# Patient Record
Sex: Female | Born: 1960 | Race: White | Hispanic: No | Marital: Single | Smoking: Never smoker
Health system: Southern US, Community
[De-identification: ages and names within clinical notes are randomized; demographics above are authoritative.]

## PROBLEM LIST (undated history)

## (undated) DIAGNOSIS — B977 Papillomavirus as the cause of diseases classified elsewhere: Secondary | ICD-10-CM

## (undated) DIAGNOSIS — I4891 Unspecified atrial fibrillation: Secondary | ICD-10-CM

## (undated) DIAGNOSIS — Z87442 Personal history of urinary calculi: Secondary | ICD-10-CM

## (undated) DIAGNOSIS — R252 Cramp and spasm: Secondary | ICD-10-CM

## (undated) DIAGNOSIS — M1990 Unspecified osteoarthritis, unspecified site: Secondary | ICD-10-CM

## (undated) DIAGNOSIS — R05 Cough: Secondary | ICD-10-CM

## (undated) DIAGNOSIS — F419 Anxiety disorder, unspecified: Secondary | ICD-10-CM

## (undated) DIAGNOSIS — R002 Palpitations: Secondary | ICD-10-CM

## (undated) DIAGNOSIS — E782 Mixed hyperlipidemia: Secondary | ICD-10-CM

## (undated) DIAGNOSIS — C801 Malignant (primary) neoplasm, unspecified: Secondary | ICD-10-CM

## (undated) DIAGNOSIS — R0600 Dyspnea, unspecified: Secondary | ICD-10-CM

## (undated) DIAGNOSIS — F439 Reaction to severe stress, unspecified: Secondary | ICD-10-CM

## (undated) DIAGNOSIS — C50919 Malignant neoplasm of unspecified site of unspecified female breast: Secondary | ICD-10-CM

## (undated) DIAGNOSIS — M7989 Other specified soft tissue disorders: Secondary | ICD-10-CM

## (undated) DIAGNOSIS — R5383 Other fatigue: Secondary | ICD-10-CM

## (undated) DIAGNOSIS — E785 Hyperlipidemia, unspecified: Secondary | ICD-10-CM

## (undated) DIAGNOSIS — R531 Weakness: Secondary | ICD-10-CM

## (undated) DIAGNOSIS — R450 Nervousness: Secondary | ICD-10-CM

## (undated) DIAGNOSIS — R0602 Shortness of breath: Secondary | ICD-10-CM

## (undated) DIAGNOSIS — Z923 Personal history of irradiation: Secondary | ICD-10-CM

## (undated) DIAGNOSIS — M79606 Pain in leg, unspecified: Secondary | ICD-10-CM

## (undated) DIAGNOSIS — L853 Xerosis cutis: Secondary | ICD-10-CM

## (undated) DIAGNOSIS — R059 Cough, unspecified: Secondary | ICD-10-CM

## (undated) DIAGNOSIS — I499 Cardiac arrhythmia, unspecified: Secondary | ICD-10-CM

## (undated) DIAGNOSIS — R011 Cardiac murmur, unspecified: Secondary | ICD-10-CM

## (undated) DIAGNOSIS — N12 Tubulo-interstitial nephritis, not specified as acute or chronic: Secondary | ICD-10-CM

## (undated) DIAGNOSIS — E559 Vitamin D deficiency, unspecified: Secondary | ICD-10-CM

## (undated) DIAGNOSIS — N390 Urinary tract infection, site not specified: Secondary | ICD-10-CM

## (undated) DIAGNOSIS — M48061 Spinal stenosis, lumbar region without neurogenic claudication: Secondary | ICD-10-CM

## (undated) DIAGNOSIS — I10 Essential (primary) hypertension: Secondary | ICD-10-CM

## (undated) DIAGNOSIS — E669 Obesity, unspecified: Secondary | ICD-10-CM

## (undated) DIAGNOSIS — N200 Calculus of kidney: Secondary | ICD-10-CM

## (undated) DIAGNOSIS — N179 Acute kidney failure, unspecified: Secondary | ICD-10-CM

## (undated) DIAGNOSIS — Z8614 Personal history of Methicillin resistant Staphylococcus aureus infection: Secondary | ICD-10-CM

## (undated) HISTORY — DX: Cramp and spasm: R25.2

## (undated) HISTORY — DX: Vitamin D deficiency, unspecified: E55.9

## (undated) HISTORY — DX: Unspecified atrial fibrillation: I48.91

## (undated) HISTORY — DX: Papillomavirus as the cause of diseases classified elsewhere: B97.7

## (undated) HISTORY — DX: Weakness: R53.1

## (undated) HISTORY — DX: Unspecified osteoarthritis, unspecified site: M19.90

## (undated) HISTORY — PX: DILATION AND CURETTAGE OF UTERUS: SHX78

## (undated) HISTORY — PX: CHOLECYSTECTOMY: SHX55

## (undated) HISTORY — DX: Xerosis cutis: L85.3

## (undated) HISTORY — DX: Palpitations: R00.2

## (undated) HISTORY — DX: Nervousness: R45.0

## (undated) HISTORY — DX: Cough, unspecified: R05.9

## (undated) HISTORY — DX: Shortness of breath: R06.02

## (undated) HISTORY — DX: Other fatigue: R53.83

## (undated) HISTORY — DX: Pain in leg, unspecified: M79.606

## (undated) HISTORY — DX: Cough: R05

## (undated) HISTORY — DX: Reaction to severe stress, unspecified: F43.9

## (undated) HISTORY — DX: Other specified soft tissue disorders: M79.89

## (undated) HISTORY — PX: INDUCED ABORTION: SHX677

## (undated) HISTORY — DX: Mixed hyperlipidemia: E78.2

## (undated) HISTORY — DX: Acute kidney failure, unspecified: N17.9

## (undated) HISTORY — PX: KNEE SURGERY: SHX244

## (undated) HISTORY — DX: Personal history of Methicillin resistant Staphylococcus aureus infection: Z86.14

---

## 2001-03-18 DIAGNOSIS — M1990 Unspecified osteoarthritis, unspecified site: Secondary | ICD-10-CM

## 2001-03-18 HISTORY — DX: Unspecified osteoarthritis, unspecified site: M19.90

## 2006-01-01 ENCOUNTER — Ambulatory Visit: Payer: Self-pay | Admitting: Internal Medicine

## 2006-01-17 ENCOUNTER — Ambulatory Visit: Payer: Self-pay | Admitting: Internal Medicine

## 2006-03-28 DIAGNOSIS — E782 Mixed hyperlipidemia: Secondary | ICD-10-CM | POA: Insufficient documentation

## 2006-04-08 ENCOUNTER — Emergency Department: Payer: Self-pay

## 2006-10-20 ENCOUNTER — Encounter: Admission: RE | Admit: 2006-10-20 | Discharge: 2006-10-20 | Payer: Self-pay | Admitting: Internal Medicine

## 2009-07-19 ENCOUNTER — Ambulatory Visit: Payer: Self-pay | Admitting: Internal Medicine

## 2010-08-08 ENCOUNTER — Ambulatory Visit: Payer: Self-pay | Admitting: Unknown Physician Specialty

## 2010-08-10 ENCOUNTER — Ambulatory Visit: Payer: Self-pay | Admitting: Unknown Physician Specialty

## 2010-10-12 ENCOUNTER — Telehealth (INDEPENDENT_AMBULATORY_CARE_PROVIDER_SITE_OTHER): Payer: Self-pay | Admitting: Internal Medicine

## 2010-10-15 NOTE — Telephone Encounter (Signed)
Results have been given to patient 

## 2011-03-19 DIAGNOSIS — Z8614 Personal history of Methicillin resistant Staphylococcus aureus infection: Secondary | ICD-10-CM

## 2011-03-19 HISTORY — DX: Personal history of Methicillin resistant Staphylococcus aureus infection: Z86.14

## 2011-05-04 ENCOUNTER — Emergency Department: Payer: Self-pay | Admitting: Emergency Medicine

## 2011-05-04 LAB — URINALYSIS, COMPLETE
Bacteria: NONE SEEN
Leukocyte Esterase: NEGATIVE
Nitrite: NEGATIVE
RBC,UR: 1 /HPF (ref 0–5)
Specific Gravity: 1.014 (ref 1.003–1.030)
WBC UR: 1 /HPF (ref 0–5)

## 2011-05-05 LAB — COMPREHENSIVE METABOLIC PANEL
Albumin: 4.1 g/dL (ref 3.4–5.0)
Alkaline Phosphatase: 63 U/L (ref 50–136)
Anion Gap: 16 (ref 7–16)
BUN: 14 mg/dL (ref 7–18)
Bilirubin,Total: 1.1 mg/dL — ABNORMAL HIGH (ref 0.2–1.0)
Chloride: 102 mmol/L (ref 98–107)
Creatinine: 1.13 mg/dL (ref 0.60–1.30)
EGFR (African American): >60
Glucose: 134 mg/dL — ABNORMAL HIGH (ref 65–99)
SGOT(AST): 25 U/L (ref 15–37)
SGPT (ALT): 25 U/L
Total Protein: 7.8 g/dL (ref 6.4–8.2)

## 2011-05-05 LAB — CBC
HCT: 40 % (ref 35.0–47.0)
HGB: 13.5 g/dL (ref 12.0–16.0)
MCHC: 33.8 g/dL (ref 32.0–36.0)
MCV: 90 fL (ref 80–100)
RBC: 4.45 10*6/uL (ref 3.80–5.20)
RDW: 13.5 % (ref 11.5–14.5)

## 2011-05-05 LAB — PREGNANCY, URINE: Pregnancy Test, Urine: NEGATIVE m[IU]/mL

## 2011-05-06 ENCOUNTER — Emergency Department: Payer: Self-pay | Admitting: Emergency Medicine

## 2011-05-06 LAB — CBC
HCT: 32.3 % — ABNORMAL LOW (ref 35.0–47.0)
HGB: 11.1 g/dL — ABNORMAL LOW (ref 12.0–16.0)
MCV: 90 fL (ref 80–100)
RDW: 13.8 % (ref 11.5–14.5)

## 2011-05-06 LAB — COMPREHENSIVE METABOLIC PANEL
Albumin: 2.8 g/dL — ABNORMAL LOW (ref 3.4–5.0)
Bilirubin,Total: 3.1 mg/dL — ABNORMAL HIGH (ref 0.2–1.0)
Chloride: 98 mmol/L (ref 98–107)
Co2: 22 mmol/L (ref 21–32)
Creatinine: 3.09 mg/dL — ABNORMAL HIGH (ref 0.60–1.30)
EGFR (African American): 21 — ABNORMAL LOW
EGFR (Non-African Amer.): 17 — ABNORMAL LOW
Osmolality: 275 (ref 275–301)
Sodium: 133 mmol/L — ABNORMAL LOW (ref 136–145)

## 2011-05-07 ENCOUNTER — Encounter (HOSPITAL_COMMUNITY): Payer: Self-pay | Admitting: *Deleted

## 2011-05-07 ENCOUNTER — Inpatient Hospital Stay (HOSPITAL_COMMUNITY)
Admission: EM | Admit: 2011-05-07 | Discharge: 2011-05-10 | DRG: 568 | Disposition: A | Discharge status: Home / Self Care | Payer: BC Managed Care – PPO | Source: Other Acute Inpatient Hospital | Attending: Internal Medicine | Admitting: Internal Medicine

## 2011-05-07 ENCOUNTER — Inpatient Hospital Stay (HOSPITAL_COMMUNITY): Payer: BC Managed Care – PPO

## 2011-05-07 DIAGNOSIS — N179 Acute kidney failure, unspecified: Secondary | ICD-10-CM

## 2011-05-07 DIAGNOSIS — N200 Calculus of kidney: Secondary | ICD-10-CM

## 2011-05-07 DIAGNOSIS — R51 Headache: Secondary | ICD-10-CM | POA: Diagnosis present

## 2011-05-07 DIAGNOSIS — N390 Urinary tract infection, site not specified: Secondary | ICD-10-CM | POA: Diagnosis present

## 2011-05-07 DIAGNOSIS — A4151 Sepsis due to Escherichia coli [E. coli]: Secondary | ICD-10-CM | POA: Diagnosis present

## 2011-05-07 DIAGNOSIS — A419 Sepsis, unspecified organism: Secondary | ICD-10-CM | POA: Diagnosis present

## 2011-05-07 HISTORY — DX: Cardiac murmur, unspecified: R01.1

## 2011-05-07 LAB — COMPREHENSIVE METABOLIC PANEL
ALT: 20 U/L (ref 0–35)
AST: 30 U/L (ref 0–37)
Albumin: 2.4 g/dL — ABNORMAL LOW (ref 3.5–5.2)
Alkaline Phosphatase: 65 U/L (ref 39–117)
BUN: 40 mg/dL — ABNORMAL HIGH (ref 6–23)
Chloride: 103 mEq/L (ref 96–112)
Potassium: 4 mEq/L (ref 3.5–5.1)
Sodium: 133 mEq/L — ABNORMAL LOW (ref 135–145)
Total Bilirubin: 2.1 mg/dL — ABNORMAL HIGH (ref 0.3–1.2)
Total Protein: 5.6 g/dL — ABNORMAL LOW (ref 6.0–8.3)

## 2011-05-07 LAB — CBC
MCHC: 34.1 g/dL (ref 30.0–36.0)
Platelets: 99 10*3/uL — ABNORMAL LOW (ref 150–400)
RDW: 14.1 % (ref 11.5–15.5)
WBC: 18.9 10*3/uL — ABNORMAL HIGH (ref 4.0–10.5)

## 2011-05-07 LAB — URINALYSIS, ROUTINE W REFLEX MICROSCOPIC
Glucose, UA: NEGATIVE mg/dL
Ketones, ur: NEGATIVE mg/dL
pH: 5.5 (ref 5.0–8.0)

## 2011-05-07 LAB — URINE MICROSCOPIC-ADD ON

## 2011-05-07 LAB — MRSA PCR SCREENING: MRSA by PCR: NEGATIVE

## 2011-05-07 LAB — GLUCOSE, CAPILLARY: Glucose-Capillary: 77 mg/dL (ref 70–99)

## 2011-05-07 MED ORDER — PANTOPRAZOLE SODIUM 40 MG PO TBEC
40.0000 mg | DELAYED_RELEASE_TABLET | Freq: Every day | ORAL | Status: DC
  Administered 2011-05-07 – 2011-05-10 (×4): 40 mg via ORAL
  Filled 2011-05-07 (×4): qty 1

## 2011-05-07 MED ORDER — SODIUM CHLORIDE 0.9 % IV SOLN
INTRAVENOUS | Status: DC
  Administered 2011-05-07 (×2): via INTRAVENOUS
  Administered 2011-05-08: 100 mL/h via INTRAVENOUS

## 2011-05-07 MED ORDER — DEXTROSE 5 % IV SOLN
1.0000 g | INTRAVENOUS | Status: DC
  Administered 2011-05-07 – 2011-05-10 (×4): 1 g via INTRAVENOUS
  Filled 2011-05-07 (×4): qty 10

## 2011-05-07 MED ORDER — BIOTENE DRY MOUTH MT LIQD
15.0000 mL | Freq: Two times a day (BID) | OROMUCOSAL | Status: DC
  Administered 2011-05-08 – 2011-05-10 (×5): 15 mL via OROMUCOSAL

## 2011-05-07 MED ORDER — SODIUM CHLORIDE 0.9 % IV SOLN: 250.0000 mL | INTRAVENOUS | Status: DC | PRN

## 2011-05-07 MED ORDER — ONDANSETRON HCL 4 MG/2ML IJ SOLN
4.0000 mg | Freq: Four times a day (QID) | INTRAMUSCULAR | Status: DC | PRN
  Administered 2011-05-07: 4 mg via INTRAVENOUS
  Filled 2011-05-07: qty 2

## 2011-05-07 MED ORDER — ACETAMINOPHEN 325 MG PO TABS
650.0000 mg | ORAL_TABLET | Freq: Four times a day (QID) | ORAL | Status: DC | PRN
  Administered 2011-05-07 – 2011-05-08 (×4): 650 mg via ORAL
  Filled 2011-05-07 (×4): qty 2

## 2011-05-07 MED ORDER — CHLORHEXIDINE GLUCONATE 0.12 % MT SOLN
15.0000 mL | Freq: Two times a day (BID) | OROMUCOSAL | Status: DC
  Administered 2011-05-07 – 2011-05-10 (×7): 15 mL via OROMUCOSAL
  Filled 2011-05-07 (×9): qty 15

## 2011-05-07 NOTE — Progress Notes (Signed)
Pt was transferred to room 4506.  Report called to [place], Charity fundraiser. Will continue to monitor.

## 2011-05-07 NOTE — Progress Notes (Signed)
UR Completed.  Heather Conrad [phone] 05/07/2011  

## 2011-05-07 NOTE — H&P (Signed)
Name: Heather Conrad MRN: [id number] DOB: 01-14-1961  LOS: 0        PCCM ADMISSION NOTE  History of Present Illness: 51 yo F with minimal PMH significant only for kidney stone 12 years ago and cholecystectomy in 1990s transferred from [facility] for hypotension and nephrolithiasis.  Patient was seen 3 days ago for right flank pain, diagnosed with kidney stone, and sent home with Rx for oxycodone.  She then started having nausea and vomiting (NBNB) 2 days ago.  She was reevaluated yesterday and sent to UC for IVF due to dehydration.  From there, she was admitted to [facility].  She was given a total of 7L fluid resuscitation.  She urinated a small amount earlier today, but has not urinated since then.  Normally she has no issues with urination.   Urology was consulted for nephrolithiasis, but did not feel comfortable with lithotripsy or stent placement due to patient's hypotension.  IR was then consulted, who did not feel comfortable doing any procedures due to irregular anatomy (one kidney smaller than the other).  Patient was transferred here for further evaluation and IR for anatomically difficult nephrostomy.   Lines: 2/19 Foley  Cultures: 2/18 Blood 2/18 Urine  Antibiotics: 2/18 Ceftriaxone  Tests/Events: 2/17  Renal U/S: mild R hydronephrosis with diminished R ureteral jet into bladder; partially obstructing R ureteral stone is not excluded (R kidney 11.92 x 6.07 x 5.40 cm; L kidney 11.34 x 4.70 x 4.82 cm)  2/18  CT abd/pelvis: Stone measuring 10mm present at R ureterovesical junction with at least mild R hydroureteronephrosis and some perinephric and periureteral inflammation.  Some apparent punctate faint densities centrally in L kidney could include one or more tiny stones vs artifact. No L hydroureteronephrosis.  No acute imflammatory or obstructive process elsewhere in abdomen and pelvis.   Consults:   Past Medical History: No past medical history on file. H/o kidney stone 12 years  ago Heart murmur NO h/o HTN, CHF, DM, renal failure   Past Surgical History No past surgical history on file. Cholecystectomy 1990s  Medications Prior to Admission: No current facility-administered medications on file as of 05/07/2011.   No current outpatient prescriptions on file as of 05/07/2011.  Oxycodone PRN pain (took 3 doses)  Allergies:  Allergies not on file NKDA  Family History No family history on file. Social History:  does not have a smoking history on file. She does not have any smokeless tobacco history on file. Her alcohol and drug histories not on file.  Social History: History   Social History  . Marital Status: Unknown    Spouse Name: N/A    Number of Children: N/A  . Years of Education: N/A   Occupational History  . Not on file.   Social History Main Topics  . Smoking status: Not on file  . Smokeless tobacco: Not on file  . Alcohol Use: Not on file  . Drug Use: Not on file  . Sexually Active: Not on file   Other Topics Concern  . Not on file   Social History Narrative  . No narrative on file     Review of Systems: Negative unless otherwise noted in HPI   Vital Signs: Temperature 97.8 F (36.6 C), temperature source Oral.  Physical Examination:  Gen: NAD, appropriate, appears comfortable, obese Neuro: alert and oriented x3, nonfocal HEENT: MMM, PERRLA, EOMI, no icterus  CV: RRR, no murmur Pulm: clear to auscultation bilaterally, no wheezes, rales, crackles Abd: soft, nontender, nondistended, +BS  Ext: warm, no edema, 2+ pedal pulses  Labs/Imaging: Results for orders placed during the hospital encounter of 05/07/11 (from the past 48 hour(s))  GLUCOSE, CAPILLARY     Status: Normal   Collection Time   05/07/11  2:48 AM      Component Value Range Comment   Glucose-Capillary 77  70 - 99 (mg/dL)    Comment 1 Documented in Chart      Comment 2 Notify RN      No results found.   Assessment and Plan: 51 yo F with minimal PMH  transferred from [facility] with hypotension, resolved s/p fluid resuscitation and nephrolithiasis.  1. Renal A: Acute renal failure (Cr 3.09 on 2/18) Nephrolithiasis: Stone position unchanged per report on 2nd CT done 2/18 Outside labs from 2/16: UA>> trace ketones, mucus; U preg >> neg; WBC 14.8 --> 16.9 (2/18); Cr 1.13 --> 3.09 (2/18); BUN 14 --> 38 (2/18); electrolytes WNL Renal U/S 2/17: mild R hydronephrosis with diminished R ureteral jet into bladder; partially obstructing R ureteral stone is not excluded (R kidney 11.92 x 6.07 x 5.40 cm; L kidney 11.34 x 4.70 x 4.82 cm)  CT abd 2/18:  10mm stone present at R ureterovesical junction with at least mild R hydroureteronephrosis  P: -Given CTX x1 at [facility] 2/18 -S/p 7L IVF -Monitor Is/Os -Cont IVF since still oliguric  -Recheck CMET, CBC -Consult Urology and/or IR in AM --> will leave NPO until evaluated for possible intervention   2. Cardiovascular A: Hypotension secondary to severe dehydration, now resolved s/p 7L fluids, hemodynamically stable P: -Monitor BPs -Appears euvolemic at this time -Continue IVF  -Blood cultures drawn as OSH  3. GI A: Nausea and vomiting, likely secondary to combination of infection and pain medication P: -Zofran PRN -Check CMET  4.  ID A:  Suspected pyelonepritis -Send UA, urine / blood cultures -Ceftriaxone  Code status: Full  Best Practice: ICU status under PCCM DVT: SCDs SUP:  Protonix Nutrition: NPO  Conrad,Heather 05/07/2011, 2:58 AM   Patient examined, records reviewed, assessment and plan discussed with resident team as above.  Orlean Bradford, M.D. Pulmonary and Critical Care Medicine [facility] Cell: [phone] Pager: [phone]

## 2011-05-07 NOTE — Significant Event (Signed)
Since admission earlier today, pt has normal blood pressure.  She is making urine, and renal function trending down.  She denies abdominal pain.  She was noted to have passed stone this morning.  Urology consult has been placed.  She does not need ICU level care.  Continue rocephin pending culture results.  Will transfer to medical floor.  Have asked [facility] to assume care from 2/20 and PCCM will sign off.  Coralyn Helling, MD 05/07/2011, 11:50 AM Pager:  [phone]

## 2011-05-07 NOTE — Consult Note (Addendum)
Urology Consult  Referring physician: Dr. Craige Cotta Reason for referral: Urosepsis with stone  Chief Complaint: Right Flank pain  History of Present Illness: 51 yo female from [place], Heather Conrad, noted onset of right-sided flank pain on Saturday night, associated with both nausea and vomiting. No hematuria. Pain reminiscent of kidney stone colic from 12 yrs ago ( which she spontaneously passed). She was seen at [facility] ER, with R flank pain, nausea and vomiting. She had IV fluid, and ultrasound, and then discharged home. Pain returned Sunday, and was episodic in nature, still in the R flank. She "delt with it" on Sunday, but noted onset of chills and sweats Sunday night. On Monday, she called Dr. Kahn\'s office Monday, and was referred to Urgent Care, with Rx IV fluid, and eventual return to [place] ED. WBC 14,000, increasing to 16,000. CT performed, which is said to show 10mm R u-v junction stone with mild hydronephrosis, and perinephric and periureteral inflammation. She  Was considered for perc nephrostomy, but was thought to have an abnormal kidney anatomy, and, in combination with urosepsis, was transferred to [place] for further evaluation and treatment. Unknown if [place] urologist saw patient. She has now passed stone this AM, and feels much improved. No nausea or vomiting, but she still complains of flank pain and general abdominal pain, unrelieved despite passing stone.   Past Medical History  Diagnosis Date  . Heart murmur     per pt report   Past Surgical History  Procedure Date  . Cholecystectomy     19 92    Medications: I have reviewed the patient's current medications. Allergies: Not on File  History reviewed. No pertinent family history. Social History:  reports that she has never smoked. She does not have any smokeless tobacco history on file. She reports that she does not drink alcohol or use illicit drugs. Passed stone 12 yrs ago.   ROS: All systems are reviewed  and negative except as noted. General abdominal pain, R flank pain and RUQ. Relieved nausea and vomiting.   Physical Exam:  Vital signs in last 24 hours: Temp:  [97.5 F (36.4 C)-98 F (36.7 C)] 97.5 F (36.4 C) (02/19 1138) Pulse Rate:  [62-69] 66  (02/19 0900) Resp:  [2-24] 22  (02/19 0900) BP: (112-124)/(69-82) 121/79 mmHg (02/19 0900) SpO2:  [95 %-100 %] 96 % (02/19 0900) Weight:  [114.2 kg (251 lb 12.3 oz)] 114.2 kg (251 lb 12.3 oz) (02/19 0300)  General: wdwnwf in NAD Cardiovascular: Skin warm; not flushed Respiratory: Breaths quiet; no shortness of breath Abdomen: No masses. + BS. + tenderness, mild, RUQ and R flank.  Neurological: Normal sensation to touch Musculoskeletal: Normal motor function arms and legs Lymphatics: No inguinal adenopathy Skin: No rashes Genitourinary:Normal BUS. Normal suprapubic area.   Laboratory Data:  Results for orders placed during the hospital encounter of 05/07/11 (from the past 72 hour(s))  GLUCOSE, CAPILLARY     Status: Normal   Collection Time   05/07/11  2:48 AM      Component Value Range Comment   Glucose-Capillary 77  70 - 99 (mg/dL)    Comment 1 Documented in Chart      Comment 2 Notify RN     MRSA PCR SCREENING     Status: Normal   Collection Time   05/07/11  2:50 AM      Component Value Range Comment   MRSA by PCR NEGATIVE  NEGATIVE    CBC     Status: Abnormal  Collection Time   05/07/11  4:20 AM      Component Value Range Comment   WBC 18.9 (*) 4.0 - 10.5 (K/uL)    RBC 3.41 (*) 3.87 - 5.11 (MIL/uL)    Hemoglobin 10.2 (*) 12.0 - 15.0 (g/dL)    HCT 47.8 (*) 29.5 - 46.0 (%)    MCV 87.7  78.0 - 100.0 (fL)    MCH 29.9  26.0 - 34.0 (pg)    MCHC 34.1  30.0 - 36.0 (g/dL)    RDW 62.1  30.8 - 65.7 (%)    Platelets 99 (*) 150 - 400 (K/uL) PLATELET COUNT CONFIRMED BY SMEAR  COMPREHENSIVE METABOLIC PANEL     Status: Abnormal   Collection Time   05/07/11  4:20 AM      Component Value Range Comment   Sodium 133 (*) 135 - 145  (mEq/L)    Potassium 4.0  3.5 - 5.1 (mEq/L)    Chloride 103  96 - 112 (mEq/L)    CO2 17 (*) 19 - 32 (mEq/L)    Glucose, Bld 81  70 - 99 (mg/dL)    BUN 40 (*) 6 - 23 (mg/dL)    Creatinine, Ser 8.46 (*) 0.50 - 1.10 (mg/dL)    Calcium 7.1 (*) 8.4 - 10.5 (mg/dL)    Total Protein 5.6 (*) 6.0 - 8.3 (g/dL)    Albumin 2.4 (*) 3.5 - 5.2 (g/dL)    AST 30  0 - 37 (U/L)    ALT 20  0 - 35 (U/L)    Alkaline Phosphatase 65  39 - 117 (U/L)    Total Bilirubin 2.1 (*) 0.3 - 1.2 (mg/dL)    GFR calc non Af Amer 19 (*) >90 (mL/min)    GFR calc Af Amer 22 (*) >90 (mL/min)   URINALYSIS, ROUTINE W REFLEX MICROSCOPIC     Status: Abnormal   Collection Time   05/07/11  5:57 AM      Component Value Range Comment   Color, Urine AMBER (*) YELLOW  BIOCHEMICALS MAY BE AFFECTED BY COLOR   APPearance CLOUDY (*) CLEAR     Specific Gravity, Urine 1.011  1.005 - 1.030     pH 5.5  5.0 - 8.0     Glucose, UA NEGATIVE  NEGATIVE (mg/dL)    Hgb urine dipstick MODERATE (*) NEGATIVE     Bilirubin Urine SMALL (*) NEGATIVE     Ketones, ur NEGATIVE  NEGATIVE (mg/dL)    Protein, ur 30 (*) NEGATIVE (mg/dL)    Urobilinogen, UA 1.0  0.0 - 1.0 (mg/dL)    Nitrite NEGATIVE  NEGATIVE     Leukocytes, UA TRACE (*) NEGATIVE    URINE MICROSCOPIC-ADD ON     Status: Abnormal   Collection Time   05/07/11  5:57 AM      Component Value Range Comment   Squamous Epithelial / LPF MANY (*) RARE     WBC, UA 3-6  <3 (WBC/hpf)    RBC / HPF 3-6  <3 (RBC/hpf)    Bacteria, UA FEW (*) RARE     Casts HYALINE CASTS (*) NEGATIVE  GRANULAR CAST   Urine-Other LESS THAN 10 mL OF URINE SUBMITTED      Recent Results (from the past 240 hour(s))  MRSA PCR SCREENING     Status: Normal   Collection Time   05/07/11  2:50 AM      Component Value Range Status Comment   MRSA by PCR NEGATIVE  NEGATIVE  Final  Creatinine:  Basename 05/07/11 0420  CREATININE 2.74*    Xrays: See report/[place] / in X-ray. Unavailable for review.    Impression/Assessment:  Urosepsis, with resolving ARF. Cr 1.13 baseline, rising to 3.09, and today 2.74 with passed ureteral stone.  She still has R flank and RUQ pain. Will re-ck her CT stone protocol today because of her continued pain, but believe she may have post passage pain. . She remains  on IV antibiotics.   Plan:  Take passed stone for analysis. Continue IV antibiotics-may need replacement of IV. OK with urology to feed.   Calvin Jablonowski I 05/07/2011, 1:27 PM      NOTE: If CT shows small kidney, then will need Lasix renal scan for evaluation.

## 2011-05-08 LAB — BASIC METABOLIC PANEL
BUN: 29 mg/dL — ABNORMAL HIGH (ref 6–23)
Chloride: 113 mEq/L — ABNORMAL HIGH (ref 96–112)
GFR calc non Af Amer: 50 mL/min — ABNORMAL LOW (ref >90)
Glucose, Bld: 110 mg/dL — ABNORMAL HIGH (ref 70–99)
Potassium: 3.5 mEq/L (ref 3.5–5.1)
Sodium: 139 mEq/L (ref 135–145)

## 2011-05-08 LAB — CBC
HCT: 29.2 % — ABNORMAL LOW (ref 36.0–46.0)
Hemoglobin: 10.1 g/dL — ABNORMAL LOW (ref 12.0–15.0)
MCH: 29.8 pg (ref 26.0–34.0)
RBC: 3.39 MIL/uL — ABNORMAL LOW (ref 3.87–5.11)

## 2011-05-08 LAB — URINE CULTURE: Culture  Setup Time: 201302190614

## 2011-05-08 MED ORDER — POTASSIUM CHLORIDE CRYS ER 20 MEQ PO TBCR
40.0000 meq | EXTENDED_RELEASE_TABLET | Freq: Once | ORAL | Status: AC
  Administered 2011-05-08: 40 meq via ORAL
  Filled 2011-05-08: qty 2

## 2011-05-08 NOTE — Progress Notes (Signed)
DAILY PROGRESS NOTE                              GENERAL INTERNAL MEDICINE [facility]  SUBJECTIVE: Denies any pain  OBJECTIVE: BP 109/77  Pulse 60  Temp(Src) 96.9 F (36.1 C) (Oral)  Resp 20  Ht 5\' 4"  (1.626 m)  Wt 114.4 kg (252 lb 3.3 oz)  BMI 43.29 kg/m2  SpO2 99%  Intake/Output Summary (Last 24 hours) at 05/08/11 1650 Last data filed at 05/08/11 1600  Gross per 24 hour  Intake   2755 ml  Output   1250 ml  Net   1505 ml                      Weight change: 0.2 kg (7.1 oz) Physical Exam: General: Alert and awake oriented x3 not in any acute distress. HEENT: anicteric sclera, pupils equal reactive to light and accommodation CVS: S1-S2 heard, no murmur rubs or gallops Chest: clear to auscultation bilaterally, no wheezing rales or rhonchi Abdomen:  normal bowel sounds, soft, nontender, nondistended, no organomegaly Neuro: Cranial nerves II-XII intact, no focal neurological deficits Extremities: no cyanosis, no clubbing or edema noted bilaterally   Lab Results:  Basename 05/08/11 0610 05/07/11 0420  NA 139 133*  K 3.5 4.0  CL 113* 103  CO2 19 17*  GLUCOSE 110* 81  BUN 29* 40*  CREATININE 1.24* 2.74*  CALCIUM 7.8* 7.1*  MG -- --  PHOS -- --    Basename 05/07/11 0420  AST 30  ALT 20  ALKPHOS 65  BILITOT 2.1*  PROT 5.6*  ALBUMIN 2.4*   No results found for this basename: LIPASE:2,AMYLASE:2 in the last 72 hours  Basename 05/08/11 0610 05/07/11 0420  WBC 10.0 18.9*  NEUTROABS -- --  HGB 10.1* 10.2*  HCT 29.2* 29.9*  MCV 86.1 87.7  PLT 113* 99*   No results found for this basename: CKTOTAL:3,CKMB:3,CKMBINDEX:3,TROPONINI:3 in the last 72 hours No components found with this basename: POCBNP:3 No results found for this basename: DDIMER:2 in the last 72 hours No results found for this basename: HGBA1C:2 in the last 72 hours No results found for this basename: CHOL:2,HDL:2,LDLCALC:2,TRIG:2,CHOLHDL:2,LDLDIRECT:2 in the last 72 hours No results found for  this basename: TSH,T4TOTAL,FREET3,T3FREE,THYROIDAB in the last 72 hours No results found for this basename: VITAMINB12:2,FOLATE:2,FERRITIN:2,TIBC:2,IRON:2,RETICCTPCT:2 in the last 72 hours  Micro Results: Recent Results (from the past 240 hour(s))  MRSA PCR SCREENING     Status: Normal   Collection Time   05/07/11  2:50 AM      Component Value Range Status Comment   MRSA by PCR NEGATIVE  NEGATIVE  Final   CULTURE, BLOOD (ROUTINE X 2)     Status: Normal (Preliminary result)   Collection Time   05/07/11  4:20 AM      Component Value Range Status Comment   Specimen Description BLOOD LEFT ARM   Final    Special Requests BOTTLES DRAWN AEROBIC AND ANAEROBIC 10CC EA   Final    Culture  Setup Time [id number]   Final    Culture     Final    Value:        BLOOD CULTURE RECEIVED NO GROWTH TO DATE CULTURE WILL BE HELD FOR 5 DAYS BEFORE ISSUING A FINAL NEGATIVE REPORT   Report Status PENDING   Incomplete   CULTURE, BLOOD (ROUTINE X 2)     Status: Normal (Preliminary result)  Collection Time   05/07/11  4:30 AM      Component Value Range Status Comment   Specimen Description BLOOD LEFT HAND   Final    Special Requests BOTTLES DRAWN AEROBIC AND ANAEROBIC 10CC EA   Final    Culture  Setup Time [id number]   Final    Culture     Final    Value:        BLOOD CULTURE RECEIVED NO GROWTH TO DATE CULTURE WILL BE HELD FOR 5 DAYS BEFORE ISSUING A FINAL NEGATIVE REPORT   Report Status PENDING   Incomplete   URINE CULTURE     Status: Normal   Collection Time   05/07/11  5:57 AM      Component Value Range Status Comment   Specimen Description URINE, RANDOM   Final    Special Requests NONE   Final    Culture  Setup Time [id number]   Final    Colony Count 20,OOO COLONIES/ML   Final    Culture     Final    Value: Multiple bacterial morphotypes present, none predominant. Suggest appropriate recollection if clinically indicated.   Report Status 05/08/2011 FINAL   Final     Studies/Results: Ct Abdomen  Pelvis Wo Contrast  05/07/2011  *RADIOLOGY REPORT*  Clinical Data: Right flank pain.  Resolving acute renal failure and urosepsis.  CT ABDOMEN AND PELVIS WITHOUT CONTRAST  Technique:  Multidetector CT imaging of the abdomen and pelvis was performed following the standard protocol without intravenous contrast.  Comparison: None.  Findings: Small right pleural effusion and minimal left pleural effusion.  Small amount of bilateral lower lobe atelectasis. Cholecystectomy clips.  Mild dilatation of the right renal collecting system and ureter to the level of the ureteral vesicle junction.  No obstructing stone or mass visible.  Tiny calculus in the upper pole of the right kidney.  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  Tiny calculus in the mid left kidney.  Poorly distended and grossly normal appearing urinary bladder.  Intrauterine device in the central uterus.  Unremarkable noncontrasted appearance of the liver, spleen, pancreas, adrenal glands and ovaries.  No gastrointestinal abnormalities or enlarged lymph nodes seen.  Mild ruptures peritoneal soft tissue stranding centrally and on the right.  No findings suspicious for appendicitis.  Enlarged lymph nodes. Lumbar and lower thoracic spine degenerative changes.  IMPRESSION:  1.  Mild right hydronephrosis and hydroureter to the level of the ureteral vesicle junction.  No obstructing stone visualized.  This could be due to a recently passed stone or non-visualized stricture or mass. 2.  Single tiny calculus in each kidney. 3.  Small right pleural effusion and minimal left pleural effusion. 4.  Mild bilateral lower lobe atelectasis.  Original Report Authenticated By: Darrol Angel, M.D.   Medications: Scheduled Meds:   . antiseptic oral rinse  15 mL Mouth Rinse q12n4p  . cefTRIAXone (ROCEPHIN)  IV  1 g Intravenous Q24H  . chlorhexidine  15 mL Mouth Rinse BID  . pantoprazole  40 mg Oral Q1200   Continuous Infusions:   . sodium chloride 100 mL/hr at  05/07/11 1344   PRN Meds:.sodium chloride, acetaminophen, ondansetron (ZOFRAN) IV  ASSESSMENT & PLAN: Principal Problem:  *Nephrolithiasis Active Problems:  Acute renal failure   ARF Likely secondary to nephrolithiasis, improving with IVF. Cr now is 1.2, check BMP in AM.  Nephrolithiasis Urology is following, CT scan showed right hydroureter but no nephrolithiasis, likely patient passed the stone. Urology for recommendations.  UTI On Rocephin, Urine and blood culture are pending.   LOS: 1 day   Xandrea Clarey A 05/08/2011, 4:50 PM

## 2011-05-09 LAB — BASIC METABOLIC PANEL WITH GFR
BUN: 14 mg/dL (ref 6–23)
CO2: 22 meq/L (ref 19–32)
Calcium: 8.4 mg/dL (ref 8.4–10.5)
Chloride: 109 meq/L (ref 96–112)
Creatinine, Ser: 0.88 mg/dL (ref 0.50–1.10)
GFR calc Af Amer: 88 mL/min — ABNORMAL LOW
GFR calc non Af Amer: 76 mL/min — ABNORMAL LOW
Glucose, Bld: 122 mg/dL — ABNORMAL HIGH (ref 70–99)
Potassium: 3.6 meq/L (ref 3.5–5.1)
Sodium: 137 meq/L (ref 135–145)

## 2011-05-09 LAB — CBC
Hemoglobin: 10 g/dL — ABNORMAL LOW (ref 12.0–15.0)
MCH: 29.2 pg (ref 26.0–34.0)
MCHC: 33.7 g/dL (ref 30.0–36.0)
MCV: 86.8 fL (ref 78.0–100.0)
RBC: 3.42 MIL/uL — ABNORMAL LOW (ref 3.87–5.11)

## 2011-05-09 MED ORDER — OXYCODONE-ACETAMINOPHEN 5-325 MG PO TABS
1.0000 | ORAL_TABLET | ORAL | Status: DC | PRN
  Administered 2011-05-09 – 2011-05-10 (×5): 1 via ORAL
  Filled 2011-05-09 (×5): qty 1

## 2011-05-09 MED ORDER — POTASSIUM CHLORIDE CRYS ER 20 MEQ PO TBCR
EXTENDED_RELEASE_TABLET | Freq: Once | ORAL | Status: AC
  Administered 2011-05-09: 20 meq via ORAL
  Filled 2011-05-09: qty 1

## 2011-05-09 NOTE — Progress Notes (Signed)
DAILY PROGRESS NOTE                              [facility]  SUBJECTIVE: Complaining about throbbing headache, 7/10.  OBJECTIVE: BP 131/78  Pulse 65  Temp(Src) 97.3 F (36.3 C) (Oral)  Resp 20  Ht 5\' 4"  (1.626 m)  Wt 114.6 kg (252 lb 10.4 oz)  BMI 43.37 kg/m2  SpO2 99%  Intake/Output Summary (Last 24 hours) at 05/09/11 1123 Last data filed at 05/09/11 0600  Gross per 24 hour  Intake   2885 ml  Output    600 ml  Net   2285 ml                      Weight change: 0.2 kg (7.1 oz) Physical Exam: General: Alert and awake oriented x3 not in any acute distress. HEENT: anicteric sclera, pupils equal reactive to light and accommodation CVS: S1-S2 heard, no murmur rubs or gallops Chest: clear to auscultation bilaterally, no wheezing rales or rhonchi Abdomen:  normal bowel sounds, soft, nontender, nondistended, no organomegaly Neuro: Cranial nerves II-XII intact, no focal neurological deficits Extremities: no cyanosis, no clubbing or edema noted bilaterally   Lab Results:  [facility] 05/09/11 0529 05/08/11 0610  NA 137 139  K 3.6 3.5  CL 109 113*  CO2 22 19  GLUCOSE 122* 110*  BUN 14 29*  CREATININE 0.88 1.24*  CALCIUM 8.4 7.8*  MG -- --  PHOS -- --    Basename 05/07/11 0420  AST 30  ALT 20  ALKPHOS 65  BILITOT 2.1*  PROT 5.6*  ALBUMIN 2.4*   Basename 05/09/11 0529 05/08/11 0610  WBC 8.1 10.0  NEUTROABS -- --  HGB 10.0* 10.1*  HCT 29.7* 29.2*  MCV 86.8 86.1  PLT 110* 113*   Micro Results: Recent Results (from the past 240 hour(s))  MRSA PCR SCREENING     Status: Normal   Collection Time   05/07/11  2:50 AM      Component Value Range Status Comment   MRSA by PCR NEGATIVE  NEGATIVE  Final   CULTURE, BLOOD (ROUTINE X 2)     Status: Normal (Preliminary result)   Collection Time   05/07/11  4:20 AM      Component Value Range Status Comment   Specimen Description BLOOD LEFT ARM   Final    Special Requests BOTTLES DRAWN AEROBIC  AND ANAEROBIC 10CC EA   Final    Culture  Setup Time [id number]   Final    Culture     Final    Value:        BLOOD CULTURE RECEIVED NO GROWTH TO DATE CULTURE WILL BE HELD FOR 5 DAYS BEFORE ISSUING A FINAL NEGATIVE REPORT   Report Status PENDING   Incomplete   CULTURE, BLOOD (ROUTINE X 2)     Status: Normal (Preliminary result)   Collection Time   05/07/11  4:30 AM      Component Value Range Status Comment   Specimen Description BLOOD LEFT HAND   Final    Special Requests BOTTLES DRAWN AEROBIC AND ANAEROBIC 10CC EA   Final    Culture  Setup Time [id number]   Final    Culture     Final    Value:        BLOOD CULTURE RECEIVED NO GROWTH TO DATE CULTURE WILL BE HELD FOR 5 DAYS BEFORE ISSUING  A FINAL NEGATIVE REPORT   Report Status PENDING   Incomplete   URINE CULTURE     Status: Normal   Collection Time   05/07/11  5:57 AM      Component Value Range Status Comment   Specimen Description URINE, RANDOM   Final    Special Requests NONE   Final    Culture  Setup Time [id number]   Final    Colony Count 20,OOO COLONIES/ML   Final    Culture     Final    Value: Multiple bacterial morphotypes present, none predominant. Suggest appropriate recollection if clinically indicated.   Report Status 05/08/2011 FINAL   Final     Studies/Results: Ct Abdomen Pelvis Wo Contrast  05/07/2011  *RADIOLOGY REPORT*  Clinical Data: Right flank pain.  Resolving acute renal failure and urosepsis.  CT ABDOMEN AND PELVIS WITHOUT CONTRAST  Technique:  Multidetector CT imaging of the abdomen and pelvis was performed following the standard protocol without intravenous contrast.  Comparison: None.  Findings: Small right pleural effusion and minimal left pleural effusion.  Small amount of bilateral lower lobe atelectasis. Cholecystectomy clips.  Mild dilatation of the right renal collecting system and ureter to the level of the ureteral vesicle junction.  No obstructing stone or mass visible.  Tiny calculus in the upper  pole of the right kidney.  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  Tiny calculus in the mid left kidney.  Poorly distended and grossly normal appearing urinary bladder.  Intrauterine device in the central uterus.  Unremarkable noncontrasted appearance of the liver, spleen, pancreas, adrenal glands and ovaries.  No gastrointestinal abnormalities or enlarged lymph nodes seen.  Mild ruptures peritoneal soft tissue stranding centrally and on the right.  No findings suspicious for appendicitis.  Enlarged lymph nodes. Lumbar and lower thoracic spine degenerative changes.  IMPRESSION:  1.  Mild right hydronephrosis and hydroureter to the level of the ureteral vesicle junction.  No obstructing stone visualized.  This could be due to a recently passed stone or non-visualized stricture or mass. 2.  Single tiny calculus in each kidney. 3.  Small right pleural effusion and minimal left pleural effusion. 4.  Mild bilateral lower lobe atelectasis.  Original Report Authenticated By: Darrol Angel, M.D.   Medications: Scheduled Meds:    . antiseptic oral rinse  15 mL Mouth Rinse q12n4p  . cefTRIAXone (ROCEPHIN)  IV  1 g Intravenous Q24H  . chlorhexidine  15 mL Mouth Rinse BID  . pantoprazole  40 mg Oral Q1200  . potassium chloride  40 mEq Oral Once  . potassium chloride  40 mEq Oral Once   Continuous Infusions:    . sodium chloride 100 mL/hr (05/08/11 2245)   PRN Meds:.sodium chloride, acetaminophen, ondansetron (ZOFRAN) IV, oxyCODONE-acetaminophen  ASSESSMENT & PLAN: Principal Problem:  *Nephrolithiasis Active Problems:  Acute renal failure   ARF Likely secondary to nephrolithiasis, improved with IV fluids. Creatinine is 0.8. Patient presented with creatinine of 2.4.  Nephrolithiasis Urology is following, CT scan showed right hydroureter but no nephrolithiasis, likely patient passed the stone. Urology for recommendations.  UTI On Rocephin, the urine culture obtained in the hospital here showed  no growth. We will contact [facility] for the urine culture, to direct antibiotic therapy as outpatient.  Headache Likely secondary to the concurrent infection and fever. Will use Percocet as needed.   LOS: 2 days   Heather Conrad A 05/09/2011, 11:23 AM

## 2011-05-10 DIAGNOSIS — N390 Urinary tract infection, site not specified: Secondary | ICD-10-CM | POA: Diagnosis present

## 2011-05-10 DIAGNOSIS — A4151 Sepsis due to Escherichia coli [E. coli]: Secondary | ICD-10-CM | POA: Diagnosis present

## 2011-05-10 LAB — BASIC METABOLIC PANEL
CO2: 21 mEq/L (ref 19–32)
Calcium: 8.6 mg/dL (ref 8.4–10.5)
Creatinine, Ser: 0.76 mg/dL (ref 0.50–1.10)
Glucose, Bld: 92 mg/dL (ref 70–99)

## 2011-05-10 MED ORDER — CIPROFLOXACIN HCL 750 MG PO TABS: 750.0000 mg | ORAL_TABLET | Freq: Two times a day (BID) | ORAL | Status: AC

## 2011-05-10 MED ORDER — OXYCODONE-ACETAMINOPHEN 5-325 MG PO TABS: 1.0000 | ORAL_TABLET | ORAL | Status: AC | PRN

## 2011-05-10 NOTE — Progress Notes (Signed)
Utilization Review Completed.Heather Conrad T2/22/2013   

## 2011-05-10 NOTE — Discharge Summary (Signed)
HOSPITAL DISCHARGE SUMMARY  Heather Conrad  MRN: [id number]  DOB:April 26, 1960  Date of Admission: 05/07/2011 Date of Discharge: 05/10/2011         LOS: 3 days   Attending Physician:Cesia Orf A  Patient's DGU:YQIH,KVQQVZ, MD, MD  Consults: [place] critical care service  Jodie Echevaria with urology service  Discharge Diagnoses: Present on Admission:  .Nephrolithiasis .Acute renal failure .UTI (lower urinary tract infection) .Sepsis due to Escherichia coli (E. coli)   Medication List  As of 05/10/2011  5:46 PM   TAKE these medications         ciprofloxacin 750 MG tablet   Commonly known as: CIPRO   Take 1 tablet (750 mg total) by mouth 2 (two) times daily.      oxyCODONE-acetaminophen 5-325 MG per tablet   Commonly known as: PERCOCET   Take 1 tablet by mouth every 4 (four) hours as needed for pain.             Brief Admission History: 51 yo F with minimal PMH significant only for kidney stone 12 years ago and cholecystectomy in 1990s transferred from [facility] for hypotension and nephrolithiasis. Patient was seen 3 days ago for right flank pain, diagnosed with kidney stone, and sent home with Rx for oxycodone. She then started having nausea and vomiting 2 days ago. She was reevaluated yesterday and sent to UC for IVF due to dehydration. From there, she was admitted to [facility]. She was given a total of 7L fluid resuscitation. She urinated a small amount earlier today, but has not urinated since then. Normally she has no issues with urination. Urology was consulted for nephrolithiasis, but did not feel comfortable with lithotripsy or stent placement due to patient's hypotension. IR was then consulted, who did not feel comfortable doing any procedures due to irregular anatomy (one kidney smaller than the other). Patient was transferred here for further evaluation and IR for anatomically difficult nephrostomy.   Hospital Course: Present on Admission:   .Nephrolithiasis .Acute renal failure .UTI (lower urinary tract infection) .Sepsis due to Escherichia coli (E. coli)  1. Sepsis: This is secondary to Escherichia coli. The cultures from [facility] comes back positive for one out of two blood cultures positive for pansensitive Escherichia coli. Patient was treated initially with IV Rocephin for 4 days. At the time of discharge she was discharged on high-dose oral ciprofloxacin of 750 mg twice a day for 10 more days to complete 14 days of antibiotics. The sepsis likely is secondary to UTI. Patient did not require vasopressors. Blood pressure was stable at the time she was presented to [facility] she stayed overnight in the ICU and she was transferred to regular bed the very next morning.  2. UTI: Complicated UTI secondary to nephrolithiasis. The urine culture is in the hospital here did not grow any organism. This is likely because patient had Rocephin in the emergency department in [facility]. But blood culture obtained from there and it showed Escherichia coli.  3. Nephrolithiasis: Patient had right hydroureter, likely secondary to UTI Will stone. At the time of admission to [facility] patient likely she passed the stone. CT scan of abdomen pelvis with renal protocol did show evidence of renal hydroureter which is improving but no evidence of stone suggesting that the stone is passed. Urology see the patient and recommended for antibiotic and followup as outpatient.  4. Acute renal failure: This is resolved, likely secondary to obstructive uropathy from renal stone. Patient presented with  creatinine of 2.4 creatinine the day of discharge is 0.7.   Day of Discharge BP 161/86  Pulse 47  Temp(Src) 98.8 F (37.1 C) (Oral)  Resp 20  Ht 5\' 4"  (1.626 m)  Wt 115.9 kg (255 lb 8.2 oz)  BMI 43.86 kg/m2  SpO2 97% Physical Exam: GEN: No acute distress, cooperative with exam PSYCH: He is alert and  oriented x4; does not appear anxious does not appear depressed; affect is normal  HEENT: Mucous membranes pink and anicteric;  Mouth: without oral thrush or lesions Eyes: PERRLA; EOM intact;  Neck: no cervical lymphadenopathy nor thyromegaly or carotid bruit; no JVD;  CHEST WALL: No tenderness, symmetrical to breathing bilaterally CHEST: Normal respiration, clear to auscultation bilaterally  HEART: Regular rate and rhythm; no murmurs, rubs or gallops, S1 and S2 heard  BACK: No kyphosis or scoliosis; no CVA tenderness  ABDOMEN:  soft non-tender; no masses, no organomegaly, normal abdominal bowel sounds; no pannus; no intertriginous candida.  EXTREMITIES: No bone or joint deformity; no edema; no ulcerations.  PULSES: 2+ and symmetric, neurovascularity is intact SKIN: Normal hydration no rash or ulceration, no flushing or suspicious lesions  CNS: Cranial nerves 2-12 grossly intact no focal neurologic deficit, coordination is intact gait not tested    Results for orders placed during the hospital encounter of 05/07/11 (from the past 24 hour(s))  BASIC METABOLIC PANEL     Status: Normal   Collection Time   05/10/11  6:45 AM      Component Value Range   Sodium 137  135 - 145 (mEq/L)   Potassium 4.3  3.5 - 5.1 (mEq/L)   Chloride 107  96 - 112 (mEq/L)   CO2 21  19 - 32 (mEq/L)   Glucose, Bld 92  70 - 99 (mg/dL)   BUN 13  6 - 23 (mg/dL)   Creatinine, Ser 4.09  0.50 - 1.10 (mg/dL)   Calcium 8.6  8.4 - 81.1 (mg/dL)   GFR calc non Af Amer >90  >90 (mL/min)   GFR calc Af Amer >90  >90 (mL/min)    Disposition: Home   Follow-up Appts: Discharge Orders    Future Orders Please Complete By Expires   Diet general      Increase activity slowly         Follow-up Information    Follow up with Yves Dill, MD. Schedule an appointment as soon as possible for a visit in 1 week.   Contact information:   [address] [place] [address] [phone]          I spent 40  minutes completing paperwork and coordinating discharge efforts.  SignedClydia Llano A 05/10/2011, 5:46 PM

## 2011-05-10 NOTE — Progress Notes (Signed)
05/10/2011 [facility] Case Management Note [phone]    CARE MANAGEMENT NOTE 05/10/2011  Patient:  Heather Conrad, Heather Conrad   Account Number:  [id number]  Date Initiated:  05/07/2011  Documentation initiated by:  [facility]  Subjective/Objective Assessment:   Admitted with ??pylonephritis - hypotension - tx in from outside hospital. Lives with children.     Action/Plan:   Anticipated DC Date:  05/10/2011   Anticipated DC Plan:  HOME/SELF CARE      DC Planning Services  CM consult      Choice offered to / List presented to:             Status of service:  Completed, signed off Medicare Important Message given?   (If response is "NO", the following Medicare IM given date fields will be blank) Date Medicare IM given:   Date Additional Medicare IM given:    Discharge Disposition:  HOME/SELF CARE  Per UR Regulation:  Reviewed for med. necessity/level of care/duration of stay  Comments:  05/10/11-1503-J.Glenda Spelman,RN,BSN  [phone]      51yo patient admitted as transfer from [facility] with c/o hypotension and nephrolithiasis. Prior to admission, patient lived at home with family support. Independent with ADLs. In to see patient. No discharge or home needs identified. Anticipated discharge to home with self care today.

## 2011-05-12 LAB — CULTURE, BLOOD (SINGLE)

## 2011-05-13 LAB — CULTURE, BLOOD (ROUTINE X 2)
Culture  Setup Time: 201302190849
Culture: NO GROWTH

## 2011-08-25 ENCOUNTER — Emergency Department (HOSPITAL_COMMUNITY)
Admission: EM | Admit: 2011-08-25 | Discharge: 2011-08-25 | Disposition: A | Discharge status: Home / Self Care | Payer: BC Managed Care – PPO | Attending: Emergency Medicine | Admitting: Emergency Medicine

## 2011-08-25 ENCOUNTER — Encounter (HOSPITAL_COMMUNITY): Payer: Self-pay | Admitting: *Deleted

## 2011-08-25 DIAGNOSIS — M79609 Pain in unspecified limb: Secondary | ICD-10-CM

## 2011-08-25 DIAGNOSIS — I8001 Phlebitis and thrombophlebitis of superficial vessels of right lower extremity: Secondary | ICD-10-CM

## 2011-08-25 DIAGNOSIS — I8000 Phlebitis and thrombophlebitis of superficial vessels of unspecified lower extremity: Secondary | ICD-10-CM | POA: Insufficient documentation

## 2011-08-25 HISTORY — DX: Calculus of kidney: N20.0

## 2011-08-25 MED ORDER — PREDNISONE 10 MG PO TABS: 20.0000 mg | ORAL_TABLET | Freq: Every day | ORAL | Status: DC

## 2011-08-25 MED ORDER — OXYCODONE-ACETAMINOPHEN 5-325 MG PO TABS
2.0000 | ORAL_TABLET | Freq: Once | ORAL | Status: AC
  Administered 2011-08-25: 2 via ORAL
  Filled 2011-08-25: qty 2

## 2011-08-25 MED ORDER — NAPROXEN 500 MG PO TABS: 500.0000 mg | ORAL_TABLET | Freq: Two times a day (BID) | ORAL | Status: AC

## 2011-08-25 NOTE — ED Notes (Signed)
Reports injuring right foot and leg at work 4-5 weeks ago, still having pain and has been seen at pcp for it. Had sore area above right knee which has now moved to upper thigh, wants to be checked for blood clot.

## 2011-08-25 NOTE — Discharge Instructions (Signed)
Phlebitis  Phlebitis is a redness, tenderness and soreness (inflammation) in a vein. This can occur in your arms, legs, or torso (trunk), as well as deeper inside your body.   CAUSES   Phlebitis can be triggered by multiple factors. These include:   Reduced (restricted) blood flow through your veins. This happens with prolonged bed rest, long distance travel, injury or surgery. Being overweight (obese) and pregnant can also restrict blood flow and lead to phlebitis.   Putting a catheter in the vein (intravenous or IV) and giving certain medications through in the vein (intravenously).   Cancer and cancer treatment.   Use of illegal intravenous drugs.   Inflammatory diseases.   Inherited (genetic) diseases that increase the risk for blood clots.   Hormone therapy (such as birth control pills).  SYMPTOMS    Red, tender, swollen, painful area on your skin.   Usually, the area will be long and narrow.   Low grade fever.   Significant firmness along the center of this area. This can indicate that a blood clot has formed.   Surrounding redness or a high fever, which can indicate an infection (cellulitis).  DIAGNOSIS    The appearance of your condition and your symptoms will cause your caregiver to suspect phlebitis. Usually, this is enough for a diagnosis.   Your caregiver may request blood tests or an ultrasound test of the area to be sure you do not have an infection or a blood clot. Blood tests and discussing your family history may also indicate if you have an underlying genetic disease that causes blood clots.   Occasionally, a piece of tissue is taken from the body (biopsy) if an unusual cause of phlebitis is suspected.  TREATMENT    Raise (elevate) the affected area above the level of the heart.   Apply a warm compress or heating pad for 20 minutes, 3 or 4 times a day. If you use an electric heating pad, follow the directions so you do not burn yourself.   Anti-inflammatory medications are usually  recommended. Follow your caregiver's directions.   Any IV catheter, if present, will be removed by your caregiver.   Your caregiver may prescribe medicines that kill germs (antibiotics) if an infection is present.   Your caregiver may recommend blood thinners if a blood clot is suspected or present.   Support stockings or bandages may be helpful, depending on the cause and location of the phlebitis.   Surgery may be needed to remove very damaged sections of vein, but this is rare.  HOME CARE INSTRUCTIONS    Take medications exactly as prescribed.   Follow up with your caregiver as directed.   Use support stockings or bandages if advised. These will speed healing and prevent recurrence.   If you are on blood thinners:   Do follow-up blood tests exactly as directed.   Check with your caregiver before using any new medications.   Wear a pendant to show that you are on blood thinners.   For phlebitis in the legs:   Avoid prolonged standing or bed rest.   Keep your legs moving. Raise your legs with sitting or lying.   Do not smoke.   Women, particularly those over the age of 35, should consider the risks and benefits of taking the contraceptive pill. This kind of hormone treatment can increase your risk for blood clots.  SEEK MEDICAL CARE IF:    You have unusual bruising or any bleeding problems.   Swelling   or pain in your affected arm or leg is not gradually improving.   You are on anti-inflammatory medication and you develop belly (abdominal) pain.  SEEK IMMEDIATE MEDICAL CARE IF:    An unexplained oral temperature above 100.5 F (38.1 C) develops.   You have sudden onset of chest pain or difficulty breathing.  Document Released: 02/26/2001 Document Revised: 02/21/2011 Document Reviewed: 11/28/2008  [employer] Patient Information 2012 [employer].

## 2011-08-25 NOTE — ED Provider Notes (Signed)
History     CSN: [id number]  Arrival date & time 08/25/11  1356   First MD Initiated Contact with Patient 08/25/11 1525      Chief Complaint  Patient presents with  . Leg Pain    (Consider location/radiation/quality/duration/timing/severity/associated sxs/prior treatment) HPI  51 year old female with history of heart palpitation presents with request for evaluation of leg pain. Patient states 5 weeks ago while and was pushing a heavy cart and injured her R knee. States the cart and was stuck against a thick carpet so she uses her right knee to push it forward. Patient notice pain to her right knee after the incident. She filed worker's comp, and have been coming to urgent care for evaluation of her right knee. She has also been referred to orthopedic for further management. Patient has had an MRI performed 5 days ago but does not know the result yet. For the past 2 weeks she has been having increasing pain to her knee which radiates up to the thigh. Described pain as a sore and achy sensation with swelling to the thigh. Pain worsening with palpation and with walking. She also notice a red streak extending up the thigh. Patient is worried about blood clot. She denies any prior history of blood clots. She is not on any exogenous hormone, recent surgery, recent travel, or prolonged bed rest. She denies fever, chills, chest pain, shortness of breath.  Past Medical History  Diagnosis Date  . Heart murmur     per pt report  . Kidney stone     Past Surgical History  Procedure Date  . Cholecystectomy     1992    History reviewed. No pertinent family history.  History  Substance Use Topics  . Smoking status: Never Smoker   . Smokeless tobacco: Not on file  . Alcohol Use: No    OB History    Grav Para Term Preterm Abortions TAB SAB Ect Mult Living                  Review of Systems  Constitutional: Negative for fever.  Respiratory: Negative for shortness of breath.     Cardiovascular: Positive for leg swelling. Negative for chest pain.  Musculoskeletal: Positive for gait problem.  Neurological: Negative for headaches.    Allergies  Review of patient's allergies indicates no known allergies.  Home Medications   Current Outpatient Rx  Name Route Sig Dispense Refill  . HYDROCODONE-ACETAMINOPHEN 7.5-325 MG PO TABS Oral Take 1 tablet by mouth every 4 (four) hours as needed. For pain    . OXYCODONE-ACETAMINOPHEN 5-325 MG PO TABS Oral Take 1 tablet by mouth every 4 (four) hours as needed. For pain      BP 162/87  Pulse 86  Temp(Src) 98.1 F (36.7 C) (Oral)  Resp 18  SpO2 96%  Physical Exam  Nursing note and vitals reviewed. Constitutional: She appears well-developed and well-nourished. No distress.       Awake, alert, nontoxic appearance. Morbidly obese  HENT:  Head: Atraumatic.  Eyes: Conjunctivae are normal. Right eye exhibits no discharge. Left eye exhibits no discharge.  Neck: Neck supple.  Cardiovascular: Normal rate and regular rhythm.   Pulmonary/Chest: Effort normal. No respiratory distress. She exhibits no tenderness.  Abdominal: Soft. There is no tenderness. There is no rebound.  Musculoskeletal: She exhibits tenderness.       Right knee: She exhibits decreased range of motion and ecchymosis. She exhibits no swelling, no effusion, no deformity and no laceration. tenderness  found.       Right upper leg: She exhibits tenderness. She exhibits no bony tenderness and no swelling.       Legs:      ROM appears intact, no obvious focal weakness  Neurological:       Mental status and motor strength appears intact  Skin: No rash noted.  Psychiatric: She has a normal mood and affect.    ED Course  Procedures (including critical care time)  Labs Reviewed - No data to display No results found.   No diagnosis found.  Heather Conrad, Heather Conrad  Female  01-16-1961  [id number]            Progress Notes signed by Sydnee Cabal at  08/25/11 1613     Author:  Sydnee Cabal  Service:  (none)  Author Type:  Sonographer   Filed:  08/25/11 1613  Note Time:  08/25/11 1612          VASCULAR LAB  PRELIMINARY PRELIMINARY PRELIMINARY PRELIMINARY  Right lower extremity venous Doppler completed.  Preliminary report: There is no DVT noted in the right lower extremity. There is thrombosis noted in thigh in a varicosity. The left greater saphenous vein also appears to have new, non occlusive thrombus forming.  Heather Conrad,  08/25/2011, 4:12 PM     MDM  Tenderness with palpable cord and erythema to R thigh.  Will obtain US to r/o DVT. Likely superficial thrombus.  No significant risk factors for DVT. Pt has had prior knee work up including MRI.  Will defer to Delbert Harness for further care.   4:59 PM Venous Doppler performed on right lower extremity shows evidence of superficial thrombus and no evidence of DVT. The findings is consistence with exam. Care instruction including nonsteroidal anti-inflammatory medication, prednisone, and warm compress were discussed. Patient to followup with her orthopedic Dr. for further management.     Fayrene Helper, PA-C 08/25/11 1701

## 2011-08-25 NOTE — Progress Notes (Signed)
VASCULAR LAB PRELIMINARY  PRELIMINARY  PRELIMINARY  PRELIMINARY  Right lower extremity venous Doppler completed.    Preliminary report: There is no DVT noted in the right lower extremity.  There is thrombosis noted in thigh in a varicosity.  The left greater saphenous vein also appears to have new, non occlusive thrombus forming.  Sherren Kerns Grant, 08/25/2011, 4:12 PM

## 2011-08-27 NOTE — ED Provider Notes (Signed)
Medical screening examination/treatment/procedure(s) were performed by non-physician practitioner and as supervising physician I was immediately available for consultation/collaboration.   Carleene Cooper III, MD 08/27/11 1245

## 2012-01-17 DIAGNOSIS — N179 Acute kidney failure, unspecified: Secondary | ICD-10-CM

## 2012-01-17 HISTORY — DX: Acute kidney failure, unspecified: N17.9

## 2014-02-16 ENCOUNTER — Other Ambulatory Visit: Payer: Self-pay | Admitting: Orthopedic Surgery

## 2014-02-16 DIAGNOSIS — M542 Cervicalgia: Secondary | ICD-10-CM

## 2014-02-16 DIAGNOSIS — M5412 Radiculopathy, cervical region: Secondary | ICD-10-CM

## 2014-03-02 ENCOUNTER — Other Ambulatory Visit: Payer: Self-pay

## 2014-03-04 ENCOUNTER — Other Ambulatory Visit: Payer: Self-pay | Admitting: Orthopedic Surgery

## 2014-03-04 ENCOUNTER — Ambulatory Visit
Admission: RE | Admit: 2014-03-04 | Discharge: 2014-03-04 | Disposition: A | Discharge status: Home / Self Care | Payer: Managed Care, Other (non HMO) | Source: Ambulatory Visit | Attending: Orthopedic Surgery | Admitting: Orthopedic Surgery

## 2014-03-04 DIAGNOSIS — M542 Cervicalgia: Secondary | ICD-10-CM

## 2014-03-04 DIAGNOSIS — M5412 Radiculopathy, cervical region: Secondary | ICD-10-CM

## 2015-02-24 ENCOUNTER — Ambulatory Visit (INDEPENDENT_AMBULATORY_CARE_PROVIDER_SITE_OTHER): Payer: Managed Care, Other (non HMO) | Admitting: Sports Medicine

## 2015-02-24 ENCOUNTER — Ambulatory Visit (INDEPENDENT_AMBULATORY_CARE_PROVIDER_SITE_OTHER): Payer: Managed Care, Other (non HMO)

## 2015-02-24 ENCOUNTER — Encounter: Payer: Self-pay | Admitting: Sports Medicine

## 2015-02-24 DIAGNOSIS — B351 Tinea unguium: Secondary | ICD-10-CM

## 2015-02-24 DIAGNOSIS — M2141 Flat foot [pes planus] (acquired), right foot: Secondary | ICD-10-CM | POA: Diagnosis not present

## 2015-02-24 DIAGNOSIS — M79672 Pain in left foot: Secondary | ICD-10-CM

## 2015-02-24 DIAGNOSIS — M7750 Other enthesopathy of unspecified foot: Secondary | ICD-10-CM

## 2015-02-24 DIAGNOSIS — M2142 Flat foot [pes planus] (acquired), left foot: Secondary | ICD-10-CM

## 2015-02-24 DIAGNOSIS — M21619 Bunion of unspecified foot: Secondary | ICD-10-CM | POA: Diagnosis not present

## 2015-02-24 DIAGNOSIS — M6588 Other synovitis and tenosynovitis, other site: Secondary | ICD-10-CM | POA: Diagnosis not present

## 2015-02-24 MED ORDER — MELOXICAM 15 MG PO TABS: 15.0000 mg | ORAL_TABLET | Freq: Every day | ORAL | Status: DC

## 2015-02-24 MED ORDER — METHYLPREDNISOLONE 4 MG PO TBPK: ORAL_TABLET | ORAL | Status: DC

## 2015-02-24 NOTE — Progress Notes (Signed)
Patient ID: Heather Conrad, female   DOB: 01/09/1961, 54 y.o.   MRN: [id number] Subjective: Heather Conrad is a 54 y.o. female patient who presents to office for evaluation of Left foot pain. Patient complains of progressive pain especially over the last 2-3 months in the Left foot at the top of the foot and ankle. Reports that pain is worse after work and especially after removing her shoes. Pain is made worse with attempting to dorsiflex the left foot. Pain has been present for over a total of 7-6 months has taken ibuprofen and Tylenol with little improvement. Denies injury/trip/fall/sprain/any causative factors, however, state that 6-7 months ago vaguely recalls filling a point of discomfort at the top of the left foot and in audible pop that could be related to pain which happened while walking at work.   Patient denies history of recent antibiotic use.   She is also concerned with fungal nail changes states that her nails have grown thickened, discolored with weird debris underneath that has been present for several years and desires treatment. Patient has tried prescription and over-the-counter antifungal lacquer without improvement in nails. Patient denies any other pedal complaints.  Patient Active Problem List   Diagnosis Date Noted  . UTI (lower urinary tract infection) 05/10/2011  . Sepsis due to Escherichia coli (E. coli) ([place]) 05/10/2011  . Nephrolithiasis 05/07/2011  . Acute renal failure ([place]) 05/07/2011   Current Outpatient Prescriptions on File Prior to Visit  Medication Sig Dispense Refill  . HYDROcodone-acetaminophen (NORCO) 7.5-325 MG per tablet Take 1 tablet by mouth every 4 (four) hours as needed. For pain    . oxyCODONE-acetaminophen (PERCOCET) 5-325 MG per tablet Take 1 tablet by mouth every 4 (four) hours as needed. For pain     No current facility-administered medications on file prior to visit.   No Known Allergies  Objective:  General: Alert and oriented x3 in no  acute distress  Dermatology: No open lesions bilateral lower extremities, no webspace macerations, no ecchymosis bilateral, all nails x 10 are discolored, thickened, dystrophic with mild subungual debris, but well manicured.  Vascular: Dorsalis Pedis and Posterior Tibial pedal pulses 2/4, Capillary Fill Time 3 seconds,(+) pedal hair growth bilateral, no edema bilateral lower extremities, Temperature gradient within normal limits.  Neurology: Gross sensation intact via light touch bilateral, No babinski sign present bilateral. (- )Tinels sign.   Musculoskeletal:There is no tenderness with palpation to the left foot and ankle however, there is slight soft tissue fullness noted along the tibialis anterior tendon, tendon feels intact with no palpable gap or nodularity ,No pain with calf compression bilateral. Ankle and pedal joint range of motion is within normal limits with no pain, there is no 1st ray hypermobility noted bilateral, significant bunion and pes planus noted, left greater than right with lesser hammertoe deformities. Strength within normal limits in all groups bilateral.   Xrays     Left Foot 3 views    Impression: Mild decrease in osseous mineralization, increase in first metatarsal angle significant for bunion, midfoot joint space narrowing with dorsal spurring, midtarsal breach, inferior and posterior calcaneal spurs with the most significant spur located inferiorly, mild lesser hammertoe, pes planus foot type,  no fractures or frank dislocation, soft tissues within normal limits, no foreign body.  Assessment and Plan: Problem List Items Addressed This Visit    None    Visit Diagnoses    Left foot pain    -  Primary    Relevant Medications  methylPREDNISolone (MEDROL DOSEPAK) 4 MG TBPK tablet    meloxicam (MOBIC) 15 MG tablet    Other Relevant Orders    DG Foot Complete Left    Tendonitis of ankle or foot        Left TA     Relevant Medications    methylPREDNISolone  (MEDROL DOSEPAK) 4 MG TBPK tablet    meloxicam (MOBIC) 15 MG tablet    Pes planus of both feet        Bunion        Dermatophytosis of nail        Relevant Orders    CBC with Differential    Basic Metabolic Panel    Hepatic Function Panel       -Complete examination performed -Xrays reviewed -Discussed treatement options for possible tendonitis likely made worse by mechanical foot structure -Rx Medrol dose pack and Meloxicam to start after dose pack is complete -Recommended icing 1-2 times daily -Rx Tri-Lock ankle brace to allow for functional and mechanical support -Recommend good supportive shoes daily -Ordered labs; CBC, BMP liver function test and consideration of starting Lamisil once treatment for tendinitis is completed  -Patient to return to office in 3 weeks or sooner if condition worsens. If symptoms are still present, May consider ordering a MRI to evaluate for possible partial rupture of the tibialis anterior tendon on the left foot.   Heather Conrad, DPM

## 2015-02-24 NOTE — Progress Notes (Deleted)
   Subjective:    Patient ID: Heather Conrad, female    DOB: 08-24-1960, 54 y.o.   MRN: [id number]  HPI    Review of Systems  All other systems reviewed and are negative.      Objective:   Physical Exam        Assessment & Plan:

## 2015-02-25 LAB — CBC WITH DIFFERENTIAL/PLATELET
BASOS ABS: 0 10*3/uL (ref 0.0–0.2)
Basos: 0 %
EOS (ABSOLUTE): 0.2 10*3/uL (ref 0.0–0.4)
Eos: 3 %
HEMATOCRIT: 36.8 % (ref 34.0–46.6)
HEMOGLOBIN: 12.3 g/dL (ref 11.1–15.9)
IMMATURE GRANULOCYTES: 0 %
Immature Grans (Abs): 0 10*3/uL (ref 0.0–0.1)
Lymphocytes Absolute: 2.5 10*3/uL (ref 0.7–3.1)
Lymphs: 26 %
MCH: 26.9 pg (ref 26.6–33.0)
MCHC: 33.4 g/dL (ref 31.5–35.7)
MCV: 81 fL (ref 79–97)
Monocytes Absolute: 0.5 10*3/uL (ref 0.1–0.9)
Monocytes: 5 %
NEUTROS PCT: 66 %
Neutrophils Absolute: 6.4 10*3/uL (ref 1.4–7.0)
Platelets: 318 10*3/uL (ref 150–379)
RBC: 4.57 x10E6/uL (ref 3.77–5.28)
RDW: 15.7 % — ABNORMAL HIGH (ref 12.3–15.4)
WBC: 9.7 10*3/uL (ref 3.4–10.8)

## 2015-02-25 LAB — HEPATIC FUNCTION PANEL
ALBUMIN: 4.2 g/dL (ref 3.5–5.5)
ALK PHOS: 83 IU/L (ref 39–117)
ALT: 18 IU/L (ref 0–32)
AST: 17 IU/L (ref 0–40)
BILIRUBIN TOTAL: 0.3 mg/dL (ref 0.0–1.2)
Bilirubin, Direct: 0.11 mg/dL (ref 0.00–0.40)
TOTAL PROTEIN: 6.6 g/dL (ref 6.0–8.5)

## 2015-02-25 LAB — BASIC METABOLIC PANEL
BUN/Creatinine Ratio: 21 (ref 9–23)
BUN: 19 mg/dL (ref 6–24)
CALCIUM: 9.5 mg/dL (ref 8.7–10.2)
CO2: 24 mmol/L (ref 18–29)
CREATININE: 0.89 mg/dL (ref 0.57–1.00)
Chloride: 106 mmol/L (ref 97–106)
GFR calc Af Amer: 86 mL/min/{1.73_m2} (ref >59)
GFR, EST NON AFRICAN AMERICAN: 74 mL/min/{1.73_m2} (ref >59)
GLUCOSE: 82 mg/dL (ref 65–99)
POTASSIUM: 4.8 mmol/L (ref 3.5–5.2)
SODIUM: 144 mmol/L (ref 136–144)

## 2015-03-17 ENCOUNTER — Ambulatory Visit: Payer: Managed Care, Other (non HMO) | Admitting: Sports Medicine

## 2015-03-24 ENCOUNTER — Ambulatory Visit (INDEPENDENT_AMBULATORY_CARE_PROVIDER_SITE_OTHER): Payer: 59 | Admitting: Sports Medicine

## 2015-03-24 ENCOUNTER — Encounter: Payer: Self-pay | Admitting: Sports Medicine

## 2015-03-24 DIAGNOSIS — M79672 Pain in left foot: Secondary | ICD-10-CM

## 2015-03-24 DIAGNOSIS — M6588 Other synovitis and tenosynovitis, other site: Secondary | ICD-10-CM | POA: Diagnosis not present

## 2015-03-24 DIAGNOSIS — B351 Tinea unguium: Secondary | ICD-10-CM | POA: Diagnosis not present

## 2015-03-24 DIAGNOSIS — M21619 Bunion of unspecified foot: Secondary | ICD-10-CM

## 2015-03-24 DIAGNOSIS — M2142 Flat foot [pes planus] (acquired), left foot: Secondary | ICD-10-CM | POA: Diagnosis not present

## 2015-03-24 DIAGNOSIS — M2141 Flat foot [pes planus] (acquired), right foot: Secondary | ICD-10-CM

## 2015-03-24 DIAGNOSIS — M7750 Other enthesopathy of unspecified foot: Secondary | ICD-10-CM

## 2015-03-24 MED ORDER — TERBINAFINE HCL 250 MG PO TABS: 250.0000 mg | ORAL_TABLET | Freq: Every day | ORAL | Status: DC

## 2015-03-24 NOTE — Progress Notes (Signed)
Patient ID: Heather Conrad, female   DOB: 01-04-1961, 55 y.o.   MRN: [id number]  Subjective: Heather Conrad is a 55 y.o. female patient who returns to office for follow up eval of Left foot pain; reports that the injection and medications (dose pack and anti-inflammatories) helped her foot feel better; has NO pain. States that she felt good wearing the brace however admits that she did not wear it every day because sometimes it would feel too tight. Patient denies any other pedal complaints.  Patient Active Problem List   Diagnosis Date Noted  . UTI (lower urinary tract infection) 05/10/2011  . Sepsis due to Escherichia coli (E. coli) ([place]) 05/10/2011  . Nephrolithiasis 05/07/2011  . Acute renal failure ([place]) 05/07/2011   Current Outpatient Prescriptions on File Prior to Visit  Medication Sig Dispense Refill  . HYDROcodone-acetaminophen (NORCO) 7.5-325 MG per tablet Take 1 tablet by mouth every 4 (four) hours as needed. For pain    . meloxicam (MOBIC) 15 MG tablet Take 1 tablet (15 mg total) by mouth daily. 30 tablet 0  . methylPREDNISolone (MEDROL DOSEPAK) 4 MG TBPK tablet Take as Instructed; Dose pack 21 tablet 0  . oxyCODONE-acetaminophen (PERCOCET) 5-325 MG per tablet Take 1 tablet by mouth every 4 (four) hours as needed. For pain     No current facility-administered medications on file prior to visit.   No Known Allergies  Objective:  General: Alert and oriented x3 in no acute distress  Dermatology: No open lesions bilateral lower extremities, no webspace macerations, no ecchymosis bilateral, all nails x 10 are short, discolored, thickened, dystrophic with mild subungual debris consistent with onychomycosis.  Vascular: Dorsalis Pedis and Posterior Tibial pedal pulses 2/4, Capillary Fill Time 3 seconds,(+) pedal hair growth bilateral, no edema bilateral lower extremities, Temperature gradient within normal limits.  Neurology: Gross sensation intact via light touch bilateral, No  babinski sign present bilateral. (- )Tinels sign.   Musculoskeletal:There is no tenderness with palpation to the left foot and ankle however, decreased soft tissue fullness at the tibialis anterior tendon, tendon feels intact with no palpable gap or nodularity ,No pain with calf compression bilateral. Ankle and pedal joint range of motion is within normal limits with no pain, there is no 1st ray hypermobility noted bilateral, significant bunion and pes planus noted, left greater than right with lesser hammertoe deformities. Strength within normal limits in all groups bilateral.    Assessment and Plan: Problem List Items Addressed This Visit    None    Visit Diagnoses    Left foot pain    -  Primary    Tendonitis of ankle or foot        Pes planus of both feet        Bunion        Dermatophytosis of nail        Relevant Medications    terbinafine (LAMISIL) 250 MG tablet       -Complete examination performed -Discussed treatement options for fungal nails and structural related tendonitis that is now resolved -Patient to complete Meloxicam once this is done to start Lamisil as ordered for fungal nails -Recommended icing 1-2 times daily as needed  -Cont with Tri-Lock ankle brace to allow for functional and mechanical support and slowly wean to prevent re-exacerbation  -Recommend good supportive shoes daily -Patient to return to office in 6 weeks for follow up eval on lamisil or sooner if condition worsens. May scan for custom orthotics at next visit to  provide additional functional support for patient for long term management/prevention of recurrent tendonitis.  Heather Conrad, DPM

## 2015-05-05 ENCOUNTER — Ambulatory Visit: Payer: 59 | Admitting: Sports Medicine

## 2015-07-28 DIAGNOSIS — R002 Palpitations: Secondary | ICD-10-CM | POA: Insufficient documentation

## 2015-07-28 DIAGNOSIS — I4891 Unspecified atrial fibrillation: Secondary | ICD-10-CM | POA: Insufficient documentation

## 2015-08-17 ENCOUNTER — Other Ambulatory Visit: Payer: Self-pay | Admitting: Certified Nurse Midwife

## 2015-08-17 DIAGNOSIS — Z1231 Encounter for screening mammogram for malignant neoplasm of breast: Secondary | ICD-10-CM

## 2015-08-18 ENCOUNTER — Ambulatory Visit
Admission: RE | Admit: 2015-08-18 | Discharge: 2015-08-18 | Disposition: A | Discharge status: Home / Self Care | Payer: 59 | Source: Ambulatory Visit | Attending: Certified Nurse Midwife | Admitting: Certified Nurse Midwife

## 2015-08-18 DIAGNOSIS — Z1231 Encounter for screening mammogram for malignant neoplasm of breast: Secondary | ICD-10-CM | POA: Insufficient documentation

## 2015-08-28 ENCOUNTER — Other Ambulatory Visit: Payer: Self-pay | Admitting: Certified Nurse Midwife

## 2015-08-28 DIAGNOSIS — R928 Other abnormal and inconclusive findings on diagnostic imaging of breast: Secondary | ICD-10-CM

## 2015-09-06 ENCOUNTER — Ambulatory Visit
Admission: RE | Admit: 2015-09-06 | Discharge: 2015-09-06 | Disposition: A | Discharge status: Home / Self Care | Payer: 59 | Source: Ambulatory Visit | Attending: Certified Nurse Midwife | Admitting: Certified Nurse Midwife

## 2015-09-06 DIAGNOSIS — R928 Other abnormal and inconclusive findings on diagnostic imaging of breast: Secondary | ICD-10-CM

## 2015-09-06 DIAGNOSIS — N63 Unspecified lump in breast: Secondary | ICD-10-CM | POA: Insufficient documentation

## 2016-03-18 DIAGNOSIS — E785 Hyperlipidemia, unspecified: Secondary | ICD-10-CM

## 2016-03-18 HISTORY — PX: FOOT FRACTURE SURGERY: SHX645

## 2016-03-18 HISTORY — DX: Hyperlipidemia, unspecified: E78.5

## 2016-04-13 ENCOUNTER — Encounter: Payer: Self-pay | Admitting: Emergency Medicine

## 2016-04-13 ENCOUNTER — Ambulatory Visit
Admission: EM | Admit: 2016-04-13 | Discharge: 2016-04-13 | Disposition: A | Discharge status: Home / Self Care | Payer: 59 | Attending: Family Medicine | Admitting: Family Medicine

## 2016-04-13 ENCOUNTER — Ambulatory Visit (INDEPENDENT_AMBULATORY_CARE_PROVIDER_SITE_OTHER): Payer: 59

## 2016-04-13 DIAGNOSIS — M79672 Pain in left foot: Secondary | ICD-10-CM

## 2016-04-13 MED ORDER — TRAMADOL HCL 50 MG PO TABS: 50.0000 mg | ORAL_TABLET | Freq: Three times a day (TID) | ORAL | 0 refills | Status: DC | PRN

## 2016-04-13 MED ORDER — PREDNISONE 20 MG PO TABS: 40.0000 mg | ORAL_TABLET | Freq: Every day | ORAL | 0 refills | Status: DC

## 2016-04-13 NOTE — Discharge Instructions (Signed)
Take medication as prescribed. Rest. Drink plenty of fluids. Elevate foot, wrap and apply ice.   Follow up with podiatry this week as discussed. See above to call.   Follow up with your primary care physician this week as needed. Return to Urgent care for new or worsening concerns.

## 2016-04-13 NOTE — ED Triage Notes (Signed)
Patient c/o pain in her left ankle for the past 5 days.  Patient denies fall or injury.

## 2016-04-13 NOTE — ED Provider Notes (Signed)
[facility] ____________________________________________  Time seen: Approximately 5:12 PM  I have reviewed the triage vital signs and the nursing notes.   HISTORY  Chief Complaint Ankle Pain (left)   HPI Heather Conrad is a 56 y.o. female presents for evaluation of left foot pain and left ankle pain. Patient reports pain is been present for the last 4 days. Reports noticing pain while she was walking around at work. Denies any fall or known injury. Denies history of similar. Patient denies any other pain or complaints. Patient reports pain is localized to foot and ankle. Denies pain radiation, paresthesias, rash or skin changes. Reports pain is primarily with actively weightbearing and ambulating. Reports pain at worst is moderate, states pain is mild at this time. Patient again denies known trigger. Patient does report that she and does a lot of moving and walking around at work. Patient reports taking over-the-counter ibuprofen (last several days with mild improvement but no resolution.  Denies chest pain, shortness of breath, palpitations, abdominal pain, dysuria, other extremity pain, other extremity swelling or rash. Denies recent sickness. Denies recent antibiotic use.   Perrin Maltese, MD: PCP No LMP recorded. Patient is not currently having periods (Reason: IUD).   Past Medical History:  Diagnosis Date  . Heart murmur    per pt report  . Kidney stone   Patient reports she is taking amiodarone and throughout the due to intermittent A. fib. Patient denies any recent A. fib.  Patient Active Problem List   Diagnosis Date Noted  . UTI (lower urinary tract infection) 05/10/2011  . Sepsis due to Escherichia coli (E. coli) ([place]) 05/10/2011  . Nephrolithiasis 05/07/2011  . Acute renal failure ([place]) 05/07/2011    Past Surgical History:  Procedure Laterality Date  . CHOLECYSTECTOMY     1992  . KNEE SURGERY Right      No current facility-administered medications  for this encounter.   Current Outpatient Prescriptions:  .  amiodarone (PACERONE) 200 MG tablet, Take 200 mg by mouth daily., Disp: , Rfl:  .  rivaroxaban (XARELTO) 20 MG TABS tablet, Take 20 mg by mouth daily with supper., Disp: , Rfl:  .  predniSONE (DELTASONE) 20 MG tablet, Take 2 tablets (40 mg total) by mouth daily., Disp: 10 tablet, Rfl: 0 .  traMADol (ULTRAM) 50 MG tablet, Take 1 tablet (50 mg total) by mouth every 8 (eight) hours as needed (Do not drive or operate machinery while taking as can cause drowsiness.)., Disp: 12 tablet, Rfl: 0  Allergies Patient has no known allergies.  History reviewed. No pertinent family history.  Social History Social History  Substance Use Topics  . Smoking status: Never Smoker  . Smokeless tobacco: Never Used  . Alcohol use No    Review of Systems Constitutional: No fever/chills Eyes: No visual changes. ENT: No sore throat. Cardiovascular: Denies chest pain. Respiratory: Denies shortness of breath. Gastrointestinal: No abdominal pain.  No nausea, no vomiting.  No diarrhea.  No constipation. Genitourinary: Negative for dysuria. Musculoskeletal: Negative for back pain. Skin: Negative for rash. Neurological: Negative for headaches, focal weakness or numbness.  10-point ROS otherwise negative.  ____________________________________________   PHYSICAL EXAM:  VITAL SIGNS: ED Triage Vitals  Enc Vitals Group     BP 04/13/16 1431 (!) 157/99     Pulse Rate 04/13/16 1431 60     Resp 04/13/16 1431 16     Temp 04/13/16 1431 97.7 F (36.5 C)     Temp Source 04/13/16 1431 Oral  SpO2 04/13/16 1431 99 %     Weight 04/13/16 1428 280 lb (127 kg)     Height 04/13/16 1428 5\' 4"  (1.626 m)     Head Circumference --      Peak Flow --      Pain Score 04/13/16 1431 5     Pain Loc --      Pain Edu? --      Excl. in [place]? --     Constitutional: Alert and oriented. Well appearing and in no acute distress. Eyes: Conjunctivae are normal. PERRL.  EOMI. ENT      Head: Normocephalic and atraumatic.      Nose: No congestion/rhinnorhea      Mouth/Throat: Mucous membranes are moist. Cardiovascular: Normal rate, regular rhythm. Grossly normal heart sounds.  Good peripheral circulation. Respiratory: Normal respiratory effort without tachypnea nor retractions. Breath sounds are clear and equal bilaterally. No wheezes, rales, rhonchi. Musculoskeletal:  Ambulatory with steady gait. No midline cervical, thoracic or lumbar tenderness to palpation. Bilateral pedal pulses equal and easily palpated. Except: Left lateral ankle and lateral dorsal foot just inferior to lateral malleolus going distally, minimal swelling, no ecchymosis, no erythema, mild pain with ankle rotations, no pain with dorsiflexion or plantar flexion, normal distal sensation, left lower extremity otherwise nontender. Right lower extremity nontender.  Neurologic:  Normal speech and language. Speech is normal. No gait instability.  Skin:  Skin is warm, dry and intact. No rash noted. Psychiatric: Mood and affect are normal. Speech and behavior are normal. Patient exhibits appropriate insight and judgment   ___________________________________________   LABS (all labs ordered are listed, but only abnormal results are displayed)  Labs Reviewed - No data to display  RADIOLOGY  Dg Ankle Complete Left  Result Date: 04/13/2016 CLINICAL DATA:  Left ankle and foot pain for 4 days. EXAM: LEFT ANKLE COMPLETE - 3+ VIEW COMPARISON:  None. FINDINGS: Enthesophytes and spurs about medial malleolus and posterior talus. No degenerative joint narrowing. No acute fracture malalignment. Non eroded heel spurs. IMPRESSION: 1. No acute finding. 2. Heel spurs. Electronically Signed   By: Monte Fantasia M.D.   On: 04/13/2016 15:38   Dg Foot Complete Left  Result Date: 04/13/2016 CLINICAL DATA:  Left ankle and foot pain for 4 days. No known injury. EXAM: LEFT FOOT - COMPLETE 3+ VIEW COMPARISON:  None.  FINDINGS: There is no evidence of fracture or dislocation. There is no evidence of erosive/acute arthropathy. First MTP osteoarthritis with spurring. Non eroded heel spurs. No opaque foreign body or soft tissue gas. IMPRESSION: 1. No acute finding. 2. Heel spurs and first MTP osteoarthritis. Electronically Signed   By: Monte Fantasia M.D.   On: 04/13/2016 15:36   ____________________________________________   PROCEDURES Procedures   Denies boot or splinting device.  ____________________________________________   INITIAL IMPRESSION / ASSESSMENT AND PLAN / ED COURSE  Pertinent labs & imaging results that were available during my care of the patient were reviewed by me and considered in my medical decision making (see chart for details).  Well-appearing patient. No acute distress. Ambulatory in room. Steady gait. Patient with point tenderness to left lateral foot just inferior to lateral malleolus going distally. Pain localized. Left lower extremity otherwise nontender. Left foot and left ankle x-ray per radiologist negative for acute bony abnormalities. Patient denies trauma. Patient reports pain is primarily with actively ambulation. Suspect inflammatory process such as tendinitis. Discussed with patient rest and immobilization to encourage healing. Patient declines crutches wrapping or boot. Will treat with  oral prednisone and tramadol as needed. Discussed indication, risks and benefits of medications with patient.Follow up with podiatry as needed.  Discussed follow up with Primary care physician this week. Discussed follow up and return parameters including no resolution or any worsening concerns. Patient verbalized understanding and agreed to plan.   ____________________________________________   FINAL CLINICAL IMPRESSION(S) / ED DIAGNOSES  Final diagnoses:  Left foot pain     Discharge Medication List as of 04/13/2016  4:22 PM    START taking these medications   Details    predniSONE (DELTASONE) 20 MG tablet Take 2 tablets (40 mg total) by mouth daily., Starting Sat 04/13/2016, Normal    traMADol (ULTRAM) 50 MG tablet Take 1 tablet (50 mg total) by mouth every 8 (eight) hours as needed (Do not drive or operate machinery while taking as can cause drowsiness.)., Starting Sat 04/13/2016, Print        Note: This dictation was prepared with Dragon dictation along with smaller phrase technology. Any transcriptional errors that result from this process are unintentional.         Marylene Land, NP 04/17/16 [phone]

## 2016-05-01 ENCOUNTER — Ambulatory Visit (INDEPENDENT_AMBULATORY_CARE_PROVIDER_SITE_OTHER): Payer: 59 | Admitting: Podiatry

## 2016-05-01 ENCOUNTER — Encounter: Payer: Self-pay | Admitting: Podiatry

## 2016-05-01 DIAGNOSIS — M7750 Other enthesopathy of unspecified foot: Secondary | ICD-10-CM

## 2016-05-01 DIAGNOSIS — M7672 Peroneal tendinitis, left leg: Secondary | ICD-10-CM | POA: Diagnosis not present

## 2016-05-01 MED ORDER — MELOXICAM 15 MG PO TABS: 15.0000 mg | ORAL_TABLET | Freq: Every day | ORAL | 3 refills | Status: DC

## 2016-05-01 NOTE — Progress Notes (Signed)
She presents today for follow-up of tendinitis and her left ankle. She states that I was here last year for the tendinitis in my ankle but Saturday he started to hurt so bad she had to go to urgent care. She states that they provide prednisone and states that it did feel some better.  Objective: Vital signs are stable alert and oriented 3. Pulses are palpable. She has pain on palpation and range of motion of the peroneal tendons both Proteus longus and brevis of the left foot. There is overlying soft tissue edema no ecchymosisin light [place]. Radiographs taken today do not demonstrate any Osseous abnormalities in this area.  Assessment: Probable peroneal tendinitis left. I injected the area today with Kenalog and local anesthetic.  Plan: I injected the area today with Kenalog and local anesthetic. Placed her on anti-inflammatories and place her in a Cam Walker. On follow-up with her in 1 month if she is not improved and we will consider an MRI of this left foot and ankle.

## 2016-05-29 ENCOUNTER — Ambulatory Visit (INDEPENDENT_AMBULATORY_CARE_PROVIDER_SITE_OTHER): Payer: 59 | Admitting: Podiatry

## 2016-05-29 ENCOUNTER — Encounter: Payer: Self-pay | Admitting: Podiatry

## 2016-05-29 DIAGNOSIS — M7672 Peroneal tendinitis, left leg: Secondary | ICD-10-CM

## 2016-05-29 NOTE — Progress Notes (Signed)
She presents today for follow-up of her peroneal tendinitis. She states that she's been wearing the boot religiously and she has her good days and bad days. She states it really doesn't seem any better than it did initially.  Objective: Vital signs are stable alert and oriented 3. At this point I highly recommend an MRI she still has pain on palpation to the posterior superior lateral malleolar region extending to the fifth metatarsal base.  Assessment: Pain limb secondary to peroneal tendinitis or tendinopathy with possible associated splint tear.  Plan: I recommended MRI this point follow up with her once this comes in.

## 2016-05-30 ENCOUNTER — Telehealth: Payer: Self-pay

## 2016-05-30 NOTE — Telephone Encounter (Signed)
Per [facility] automated system, PA is not required for MRI studies

## 2016-06-03 ENCOUNTER — Telehealth: Payer: Self-pay | Admitting: *Deleted

## 2016-06-03 NOTE — Telephone Encounter (Addendum)
Pt is calling for the status of the MRI. Faxed request for copy of MRI disc to [facility]. Unable to leave a message on pt's home phone not voice mail. I informed pt of Dr. Stephenie Acres request for overread and explained the delay. Pt states understanding.

## 2016-06-07 NOTE — Telephone Encounter (Signed)
I spoke with Radiology scheduling, they will contact the patient to schedule her appt.

## 2016-06-21 ENCOUNTER — Ambulatory Visit: Payer: 59

## 2016-06-21 ENCOUNTER — Ambulatory Visit
Admission: RE | Admit: 2016-06-21 | Discharge: 2016-06-21 | Disposition: A | Discharge status: Home / Self Care | Payer: 59 | Source: Ambulatory Visit | Attending: Podiatry | Admitting: Podiatry

## 2016-06-21 DIAGNOSIS — Q688 Other specified congenital musculoskeletal deformities: Secondary | ICD-10-CM | POA: Diagnosis not present

## 2016-06-21 DIAGNOSIS — M19072 Primary osteoarthritis, left ankle and foot: Secondary | ICD-10-CM | POA: Diagnosis not present

## 2016-06-21 DIAGNOSIS — M7672 Peroneal tendinitis, left leg: Secondary | ICD-10-CM

## 2016-06-21 DIAGNOSIS — M722 Plantar fascial fibromatosis: Secondary | ICD-10-CM | POA: Insufficient documentation

## 2016-06-25 NOTE — Telephone Encounter (Addendum)
-----   Message from Garrel Ridgel, [place] sent at 06/24/2016  7:02 AM EDT ----- Please send for an over read and inform patient of the delay.07/01/2016-Mailed copy of MRI disc to [facility].

## 2016-07-01 ENCOUNTER — Ambulatory Visit: Payer: 59 | Admitting: Podiatry

## 2016-07-11 ENCOUNTER — Telehealth: Payer: Self-pay | Admitting: Podiatry

## 2016-07-11 NOTE — Telephone Encounter (Signed)
Patient called  wanting to know if Dr. Milinda Pointer has received her MRI results from [facility] for 06/24/16.  She would like for someone to call her with those results if possible or to make a follow up appointment.  Thanks!

## 2016-07-12 ENCOUNTER — Encounter: Payer: Self-pay | Admitting: Podiatry

## 2016-07-12 NOTE — Telephone Encounter (Signed)
Left message informing pt the MRI results had been received and scanned and Dr. Milinda Pointer would like her to make an appt to discuss.

## 2016-07-16 ENCOUNTER — Telehealth: Payer: Self-pay | Admitting: Podiatry

## 2016-07-16 NOTE — Telephone Encounter (Signed)
Unable to reach pt to schedule appt

## 2016-07-16 NOTE — Telephone Encounter (Addendum)
Unable to leave message to schedule voicemail box is not set up. Called pt on work phone and transferred to [employer].

## 2016-07-17 ENCOUNTER — Ambulatory Visit (INDEPENDENT_AMBULATORY_CARE_PROVIDER_SITE_OTHER): Payer: 59 | Admitting: Podiatry

## 2016-07-17 ENCOUNTER — Encounter: Payer: Self-pay | Admitting: Podiatry

## 2016-07-17 DIAGNOSIS — Q688 Other specified congenital musculoskeletal deformities: Secondary | ICD-10-CM | POA: Diagnosis not present

## 2016-07-17 DIAGNOSIS — M7752 Other enthesopathy of left foot: Secondary | ICD-10-CM | POA: Diagnosis not present

## 2016-07-18 NOTE — Progress Notes (Signed)
She presents today for follow-up of her MRI. States the boot is driving her crazy.  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  Plan: I injected that area today with Kenalog and local anesthetic she does not alleviate symptoms I will consider surgical intervention.

## 2016-07-29 ENCOUNTER — Encounter: Payer: Self-pay | Admitting: Podiatry

## 2016-07-29 ENCOUNTER — Ambulatory Visit (INDEPENDENT_AMBULATORY_CARE_PROVIDER_SITE_OTHER): Payer: 59 | Admitting: Podiatry

## 2016-07-29 DIAGNOSIS — Q688 Other specified congenital musculoskeletal deformities: Secondary | ICD-10-CM

## 2016-07-29 DIAGNOSIS — M7672 Peroneal tendinitis, left leg: Secondary | ICD-10-CM

## 2016-07-29 NOTE — Progress Notes (Signed)
She presents today for surgical consult regarding her left foot. 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 She states that her foot spelled killer she is ready to have something done. She states that her foot hurts so bad that she can not continue to work at this rate and is affecting her ability to perform her daily activities including exercise and maintain employment.  Objective: Vital signs are stable she is alert and oriented 3. Pulses are palpable. Neurologic sensorium is intact. Deep tendon reflexes are intact. Pain on palpation the posterior aspect of the subtalar joint and ankle today. She has pain with plantarflexion in the posterior aspect of the foot she has pain on palpation medial calcaneal tubercle of the left foot she also has pain with dorsiflexion of the foot and extension of the great toe metatarsophalangeal joint.  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  Plan: Consented her today for a excision of an os trigonum left foot as well as an endoscopic plantar fasciotomy. She has a history of A. fib associated with bowel disorder and is currently taking Xeralto today. We went over the consent form line by line number number giving her ample time as question she saw fit regarding these procedures. I answered them to the best of my ability limbs terms. She understands that she will need to be off of her blood thinner for a few days prior to surgery. We will try to expedite this as fast as possible to get her in over the next week or so. We are going to ask permission from her primary to discontinue blood thinner and start aspirin. I will follow-up with her in the near future. She understands that this may or may not alleviate her symptoms and total she may have numbness tingling nerve damage associated with surgery. We did also discuss the possible postop complications which may include but are not limited to postop pain bleeding swelling  infection recurrence need for further surgery overcorrection or under correction etc.

## 2016-07-29 NOTE — Patient Instructions (Signed)

## 2016-07-30 ENCOUNTER — Telehealth: Payer: Self-pay | Admitting: *Deleted

## 2016-07-30 NOTE — Telephone Encounter (Signed)
"  I'm calling to see if you have my surgery scheduled for Friday yet."  No, I don't have it scheduled yet.  I just got it today and Dr. Milinda Pointer is requesting medical clearance.  What's the doctor's name?  "I see Dr. Mayer Masker for primary care and Dr. Marella Bile is my Cardiologist."  I will get clearance.  What insurance do you have?  "I have [facility]."  I will attempt to get them authorized, sometime it takes a while to get an authorization from [facility].  We may have to schedule surgery for May 25.  "Dr. Milinda Pointer said he's going to be out that date."  I will check with him to make sure."

## 2016-08-01 ENCOUNTER — Telehealth: Payer: Self-pay | Admitting: Podiatry

## 2016-08-01 ENCOUNTER — Encounter: Payer: Self-pay | Admitting: Podiatry

## 2016-08-01 NOTE — Telephone Encounter (Addendum)
I am returning your call.  We have you scheduled for surgery on 08/09/2016.  We have not received medical clearance from Dr. Marella Bile office.  I had Janett Billow to tell them that there is not any forms that need to be filled out.  We just need a letter stating that you are cleared.  "They told me they were taking care of it when I was there on Tuesday.  They haven't sent you anything?"  No, they have not sent anything.  "It says it on the paperwork that they gave me.  Will it be okay to bring that?"  If it is typed on your after visit summary that is fine.  I will bring it over to the [place] office on tomorrow and they can fax it to you."  Will my surgery date me changed any?  No, we have you scheduled.  "Is there any way possible I can get an estimate of how much I will have to pay?"  Yes, I'll get Jocelyn Lamer, works in [employer], to give you a call with an estimate.  "I would appreciate it."  I'm calling you back.  Did he tell you when to stop the Xarelto?  "He told me to stop it 3 days prior to surgery and to start taking it again after surgery."  Is that information on the summary?  Dr. Milinda Pointer needs to have that written.  "It's in my car.  If it's not on there I will call them tomorrow and get them to do it before I bring it by the [facility] office."  (EPF and Excision Ostrigonium left foot - 75 min)

## 2016-08-01 NOTE — Telephone Encounter (Signed)
"  I'm spoke with you yesterday.  I'm just trying to find out where we stand with my surgery for Friday.  Call me back.  Thank you."  "I called my insurance and they informed me that nothing has been submitted for my surgery on the 18th.  Please call me back."

## 2016-08-01 NOTE — Telephone Encounter (Signed)
"  I'm calling again.  I was scheduled for surgery on tomorrow.  I'm just seating here wondering what to do.  Please give me a call."

## 2016-08-01 NOTE — Telephone Encounter (Signed)
Patient called the office wanting to know what time she needs to be at the surgery center? She said that she has left message for you and no one has called her back.  I did tell her that it had been rescheduled to 08/09/16. Can you please call her?

## 2016-08-01 NOTE — Telephone Encounter (Signed)
Patient was in the office on 07/31/16 and brought FMLA paperwork to be completed. She said that she had been scheduled for surgery for 08/02/16.  However, when I spoke with Dr. Milinda Pointer yesterday about her surgery, he said that he thought her surgery was cancelled and would be rescheduled for another date.  Please advise what date she will have surgery as he had given her a letter at her last visit stating she would remain out of work until her surgery date of 08/02/16. IF she is not having surgery tomorrow, I need to know what date so that I can complete her paperwork. Thank you!

## 2016-08-01 NOTE — Telephone Encounter (Signed)
Patient is scheduled for 08/09/2016.  We do not have medical clearance for her yet from Dr. Chancy Milroy.  Have you received anything there about medical clearance?  They called and asked what was needed and I told them there is not a form.  We just need a letter stating she is cleared.

## 2016-08-06 NOTE — Telephone Encounter (Signed)
I informed patient that her surgery was scheduled for 08/09/2016.

## 2016-08-08 ENCOUNTER — Other Ambulatory Visit: Payer: Self-pay | Admitting: Podiatry

## 2016-08-08 MED ORDER — HYDROMORPHONE HCL 4 MG PO TABS: 4.0000 mg | ORAL_TABLET | ORAL | 0 refills | Status: DC | PRN

## 2016-08-08 MED ORDER — CEPHALEXIN 500 MG PO CAPS: 500.0000 mg | ORAL_CAPSULE | Freq: Three times a day (TID) | ORAL | 0 refills | Status: DC

## 2016-08-08 MED ORDER — ONDANSETRON HCL 4 MG PO TABS: 4.0000 mg | ORAL_TABLET | Freq: Three times a day (TID) | ORAL | 0 refills | Status: DC | PRN

## 2016-08-09 ENCOUNTER — Encounter: Payer: Self-pay | Admitting: Podiatry

## 2016-08-09 DIAGNOSIS — M86372 Chronic multifocal osteomyelitis, left ankle and foot: Secondary | ICD-10-CM

## 2016-08-09 DIAGNOSIS — M722 Plantar fascial fibromatosis: Secondary | ICD-10-CM

## 2016-08-15 NOTE — Progress Notes (Signed)
DOS 05.25.2018 Excision Os Trigonum Left Ankle.   DOS 05.25.2018 Endoscopic Plantar Fasciotomy Left Ankle.

## 2016-08-19 ENCOUNTER — Encounter: Payer: Self-pay | Admitting: Podiatry

## 2016-08-19 ENCOUNTER — Encounter: Payer: 59 | Admitting: Podiatry

## 2016-08-19 ENCOUNTER — Ambulatory Visit (INDEPENDENT_AMBULATORY_CARE_PROVIDER_SITE_OTHER): Payer: 59

## 2016-08-19 ENCOUNTER — Ambulatory Visit: Payer: 59

## 2016-08-19 ENCOUNTER — Ambulatory Visit (INDEPENDENT_AMBULATORY_CARE_PROVIDER_SITE_OTHER): Payer: Self-pay | Admitting: Podiatry

## 2016-08-19 ENCOUNTER — Ambulatory Visit: Payer: 59 | Admitting: Podiatry

## 2016-08-19 DIAGNOSIS — Q688 Other specified congenital musculoskeletal deformities: Secondary | ICD-10-CM

## 2016-08-19 DIAGNOSIS — M722 Plantar fascial fibromatosis: Secondary | ICD-10-CM

## 2016-08-19 NOTE — Progress Notes (Signed)
She presents today for her first postop visit she is status post endoscopic fasciotomy and excision of os trigonum left foot. She states that she is doing very well she utilizes her crutches most of the time.  Objective: Vital signs are stable she's alert and oriented 3 dry sterile dressing intact once removed demonstrates mild edema no erythema cellulitis drainage or odor sutures are intact margins are well coapted staples are intact R Denzer well coapted.  Assessment: Well-healing surgical foot left. Well-healing plantar fasciotomy and excision of os trigonum date of surgery 08/09/2016.  Plan: Redressed the foot today dresser compressive dressings T all conservative therapies follow up with me in 1 week for suture removal.

## 2016-08-26 ENCOUNTER — Ambulatory Visit (INDEPENDENT_AMBULATORY_CARE_PROVIDER_SITE_OTHER): Payer: 59 | Admitting: Podiatry

## 2016-08-26 ENCOUNTER — Encounter: Payer: Self-pay | Admitting: Podiatry

## 2016-08-26 DIAGNOSIS — Q688 Other specified congenital musculoskeletal deformities: Secondary | ICD-10-CM

## 2016-08-26 DIAGNOSIS — M722 Plantar fascial fibromatosis: Secondary | ICD-10-CM

## 2016-08-26 NOTE — Progress Notes (Signed)
She presents today 3 weeks status post os trigonum removal and an endoscopic fasciotomy left foot. She states it is doing better every day.  Objective: Vital signs are stable alert and oriented 3 staples are intact and there is no erythema or edema cellulitis drainage or odor.  Assessment: Well-healing surgical foot left.  Plan: I'm going to encourage her to wear her compression anklet and continue the use of her cam walker for the next week or 2. She will then transition to a regular tennis shoe. I will follow-up with her and one month.

## 2016-09-09 ENCOUNTER — Other Ambulatory Visit: Payer: Self-pay | Admitting: Internal Medicine

## 2016-09-09 DIAGNOSIS — R921 Mammographic calcification found on diagnostic imaging of breast: Secondary | ICD-10-CM

## 2016-09-10 ENCOUNTER — Other Ambulatory Visit: Payer: Self-pay | Admitting: Internal Medicine

## 2016-09-10 DIAGNOSIS — N289 Disorder of kidney and ureter, unspecified: Secondary | ICD-10-CM

## 2016-09-16 ENCOUNTER — Encounter: Payer: Self-pay | Admitting: Podiatry

## 2016-09-16 ENCOUNTER — Ambulatory Visit (INDEPENDENT_AMBULATORY_CARE_PROVIDER_SITE_OTHER): Payer: 59 | Admitting: Podiatry

## 2016-09-16 DIAGNOSIS — Q688 Other specified congenital musculoskeletal deformities: Secondary | ICD-10-CM

## 2016-09-16 DIAGNOSIS — M722 Plantar fascial fibromatosis: Secondary | ICD-10-CM

## 2016-09-16 NOTE — Progress Notes (Signed)
She presents today status post endoscopic fasciotomy excision of os trigonum of the left ankle she states is doing real good. Date of surgery 08/09/2016.  Objective: Vital signs are stable she's alert and oriented 3 incision sites on the heel uneventfully minimal edema. Full range of motion without pain on dorsiflexion or plantarflexion of the FHL EHL and EDL and EFL.  Assessment: Well-healing surgical foot left.  Plan: Follow up with me in 1-2 months.

## 2016-09-17 ENCOUNTER — Ambulatory Visit
Admission: RE | Admit: 2016-09-17 | Discharge: 2016-09-17 | Disposition: A | Discharge status: Home / Self Care | Payer: 59 | Source: Ambulatory Visit | Attending: Internal Medicine | Admitting: Internal Medicine

## 2016-09-17 DIAGNOSIS — N289 Disorder of kidney and ureter, unspecified: Secondary | ICD-10-CM | POA: Insufficient documentation

## 2016-10-08 NOTE — Progress Notes (Signed)
This encounter was created in error - please disregard.

## 2016-10-14 ENCOUNTER — Encounter: Payer: 59 | Admitting: Podiatry

## 2016-10-14 NOTE — Progress Notes (Signed)
This encounter was created in error - please disregard.

## 2016-10-15 ENCOUNTER — Other Ambulatory Visit: Payer: Self-pay

## 2016-10-17 ENCOUNTER — Ambulatory Visit: Payer: 59 | Admitting: Certified Nurse Midwife

## 2017-01-16 DIAGNOSIS — I499 Cardiac arrhythmia, unspecified: Secondary | ICD-10-CM

## 2017-01-16 DIAGNOSIS — E669 Obesity, unspecified: Secondary | ICD-10-CM

## 2017-01-16 DIAGNOSIS — N200 Calculus of kidney: Secondary | ICD-10-CM

## 2017-01-16 HISTORY — DX: Calculus of kidney: N20.0

## 2017-01-16 HISTORY — DX: Cardiac arrhythmia, unspecified: I49.9

## 2017-01-16 HISTORY — DX: Obesity, unspecified: E66.9

## 2017-01-17 ENCOUNTER — Other Ambulatory Visit: Payer: Self-pay | Admitting: Urology

## 2017-01-17 DIAGNOSIS — R310 Gross hematuria: Secondary | ICD-10-CM

## 2017-01-24 ENCOUNTER — Ambulatory Visit
Admission: RE | Admit: 2017-01-24 | Discharge: 2017-01-24 | Disposition: A | Discharge status: Home / Self Care | Payer: 59 | Source: Ambulatory Visit | Attending: Urology | Admitting: Urology

## 2017-01-24 DIAGNOSIS — N136 Pyonephrosis: Secondary | ICD-10-CM | POA: Insufficient documentation

## 2017-01-24 DIAGNOSIS — K760 Fatty (change of) liver, not elsewhere classified: Secondary | ICD-10-CM | POA: Insufficient documentation

## 2017-01-24 DIAGNOSIS — Q638 Other specified congenital malformations of kidney: Secondary | ICD-10-CM | POA: Diagnosis not present

## 2017-01-24 DIAGNOSIS — R310 Gross hematuria: Secondary | ICD-10-CM

## 2017-01-24 DIAGNOSIS — N200 Calculus of kidney: Secondary | ICD-10-CM | POA: Insufficient documentation

## 2017-01-24 DIAGNOSIS — D3501 Benign neoplasm of right adrenal gland: Secondary | ICD-10-CM | POA: Diagnosis not present

## 2017-01-24 MED ORDER — IOPAMIDOL (ISOVUE-370) INJECTION 76%
125.0000 mL | Freq: Once | INTRAVENOUS | Status: AC | PRN
  Administered 2017-01-24: 125 mL via INTRAVENOUS

## 2017-02-11 ENCOUNTER — Encounter: Payer: Self-pay | Admitting: *Deleted

## 2017-02-11 NOTE — OR Nursing (Signed)
Patient to stop/hold Xarelto for 3 days prior to procedure, per Dr. Chancy Milroy.

## 2017-02-12 MED ORDER — DEXTROSE 5 % IV SOLN
1.0000 g | Freq: Once | INTRAVENOUS | Status: AC
  Administered 2017-02-13: 1 g via INTRAVENOUS
  Filled 2017-02-12: qty 10

## 2017-02-13 ENCOUNTER — Encounter
Admission: RE | Disposition: A | Discharge status: Home / Self Care | Payer: Self-pay | Source: Ambulatory Visit | Attending: Urology

## 2017-02-13 ENCOUNTER — Other Ambulatory Visit: Payer: Self-pay

## 2017-02-13 ENCOUNTER — Encounter: Payer: Self-pay | Admitting: *Deleted

## 2017-02-13 ENCOUNTER — Ambulatory Visit
Admission: RE | Admit: 2017-02-13 | Discharge: 2017-02-13 | Disposition: A | Discharge status: Home / Self Care | Payer: 59 | Source: Ambulatory Visit | Attending: Urology | Admitting: Urology

## 2017-02-13 DIAGNOSIS — N200 Calculus of kidney: Secondary | ICD-10-CM | POA: Diagnosis not present

## 2017-02-13 DIAGNOSIS — E119 Type 2 diabetes mellitus without complications: Secondary | ICD-10-CM | POA: Insufficient documentation

## 2017-02-13 DIAGNOSIS — I2510 Atherosclerotic heart disease of native coronary artery without angina pectoris: Secondary | ICD-10-CM | POA: Insufficient documentation

## 2017-02-13 DIAGNOSIS — I499 Cardiac arrhythmia, unspecified: Secondary | ICD-10-CM | POA: Diagnosis not present

## 2017-02-13 DIAGNOSIS — Z6841 Body Mass Index (BMI) 40.0 and over, adult: Secondary | ICD-10-CM | POA: Diagnosis not present

## 2017-02-13 DIAGNOSIS — Z7901 Long term (current) use of anticoagulants: Secondary | ICD-10-CM | POA: Insufficient documentation

## 2017-02-13 DIAGNOSIS — I10 Essential (primary) hypertension: Secondary | ICD-10-CM | POA: Insufficient documentation

## 2017-02-13 DIAGNOSIS — E785 Hyperlipidemia, unspecified: Secondary | ICD-10-CM | POA: Diagnosis not present

## 2017-02-13 DIAGNOSIS — E669 Obesity, unspecified: Secondary | ICD-10-CM | POA: Diagnosis not present

## 2017-02-13 DIAGNOSIS — Z79899 Other long term (current) drug therapy: Secondary | ICD-10-CM | POA: Insufficient documentation

## 2017-02-13 HISTORY — DX: Urinary tract infection, site not specified: N39.0

## 2017-02-13 HISTORY — DX: Obesity, unspecified: E66.9

## 2017-02-13 HISTORY — DX: Tubulo-interstitial nephritis, not specified as acute or chronic: N12

## 2017-02-13 HISTORY — PX: EXTRACORPOREAL SHOCK WAVE LITHOTRIPSY: SHX1557

## 2017-02-13 HISTORY — DX: Hyperlipidemia, unspecified: E78.5

## 2017-02-13 HISTORY — DX: Dyspnea, unspecified: R06.00

## 2017-02-13 HISTORY — DX: Cardiac arrhythmia, unspecified: I49.9

## 2017-02-13 SURGERY — LITHOTRIPSY, ESWL
Anesthesia: Moderate Sedation | Laterality: Left

## 2017-02-13 MED ORDER — FUROSEMIDE 10 MG/ML IJ SOLN
10.0000 mg | Freq: Once | INTRAMUSCULAR | Status: AC
  Administered 2017-02-13: 10 mg via INTRAVENOUS

## 2017-02-13 MED ORDER — MORPHINE SULFATE (PF) 10 MG/ML IV SOLN
INTRAVENOUS | Status: AC
  Filled 2017-02-13: qty 1

## 2017-02-13 MED ORDER — PROMETHAZINE HCL 25 MG/ML IJ SOLN
INTRAMUSCULAR | Status: AC
  Filled 2017-02-13: qty 1

## 2017-02-13 MED ORDER — FUROSEMIDE 10 MG/ML IJ SOLN
INTRAMUSCULAR | Status: AC
  Administered 2017-02-13: 10 mg via INTRAVENOUS
  Filled 2017-02-13: qty 2

## 2017-02-13 MED ORDER — DIPHENHYDRAMINE HCL 25 MG PO CAPS
25.0000 mg | ORAL_CAPSULE | ORAL | Status: AC
  Administered 2017-02-13: 25 mg via ORAL

## 2017-02-13 MED ORDER — DIPHENHYDRAMINE HCL 25 MG PO CAPS
ORAL_CAPSULE | ORAL | Status: AC
  Filled 2017-02-13: qty 1

## 2017-02-13 MED ORDER — DEXTROSE-NACL 5-0.45 % IV SOLN
INTRAVENOUS | Status: DC
  Administered 2017-02-13: 14:00:00 via INTRAVENOUS

## 2017-02-13 MED ORDER — MORPHINE SULFATE (PF) 10 MG/ML IV SOLN
10.0000 mg | Freq: Once | INTRAVENOUS | Status: AC
  Administered 2017-02-13: 10 mg via INTRAMUSCULAR

## 2017-02-13 MED ORDER — MIDAZOLAM HCL 2 MG/2ML IJ SOLN
1.0000 mg | Freq: Once | INTRAMUSCULAR | Status: AC
  Administered 2017-02-13: 1 mg via INTRAMUSCULAR

## 2017-02-13 MED ORDER — PROMETHAZINE HCL 25 MG/ML IJ SOLN
25.0000 mg | Freq: Once | INTRAMUSCULAR | Status: AC
  Administered 2017-02-13: 25 mg via INTRAMUSCULAR

## 2017-02-13 MED ORDER — MIDAZOLAM HCL 2 MG/2ML IJ SOLN
INTRAMUSCULAR | Status: AC
  Filled 2017-02-13: qty 2

## 2017-02-13 MED ORDER — NUCYNTA 50 MG PO TABS: 50.0000 mg | ORAL_TABLET | Freq: Four times a day (QID) | ORAL | 0 refills | Status: DC | PRN

## 2017-02-13 MED ORDER — TAMSULOSIN HCL 0.4 MG PO CAPS: 0.4000 mg | ORAL_CAPSULE | Freq: Every day | ORAL | 1 refills | Status: DC

## 2017-02-13 MED ORDER — ONDANSETRON 8 MG PO TBDP: 4.0000 mg | ORAL_TABLET | Freq: Four times a day (QID) | ORAL | 3 refills | Status: DC | PRN

## 2017-02-13 NOTE — Discharge Instructions (Signed)
Lithotripsy, Care After °This sheet gives you information about how to care for yourself after your procedure. Your health care provider may also give you more specific instructions. If you have problems or questions, contact your health care provider. °What can I expect after the procedure? °After the procedure, it is common to have: °· Some blood in your urine. This should only last for a few days. °· Soreness in your back, sides, or upper abdomen for a few days. °· Blotches or bruises on your back where the pressure wave entered the skin. °· Pain, discomfort, or nausea when pieces (fragments) of the kidney stone move through the tube that carries urine from the kidney to the bladder (ureter). Stone fragments may pass soon after the procedure, but they may continue to pass for up to 4-8 weeks. °? If you have severe pain or nausea, contact your health care provider. This may be caused by a large stone that was not broken up, and this may mean that you need more treatment. °· Some pain or discomfort during urination. °· Some pain or discomfort in the lower abdomen or (in men) at the base of the penis. ° °Follow these instructions at home: °Medicines °· Take over-the-counter and prescription medicines only as told by your health care provider. °· If you were prescribed an antibiotic medicine, take it as told by your health care provider. Do not stop taking the antibiotic even if you start to feel better. °· Do not drive for 24 hours if you were given a medicine to help you relax (sedative). °· Do not drive or use heavy machinery while taking prescription pain medicine. °Eating and drinking °· Drink enough water and fluids to keep your urine clear or pale yellow. This helps any remaining pieces of the stone to pass. It can also help prevent new stones from forming. °· Eat plenty of fresh fruits and vegetables. °· Follow instructions from your health care provider about eating and drinking restrictions. You may be  instructed: °? To reduce how much salt (sodium) you eat or drink. Check ingredients and nutrition facts on packaged foods and beverages. °? To reduce how much meat you eat. °· Eat the recommended amount of calcium for your age and gender. Ask your health care provider how much calcium you should have. °General instructions °· Get plenty of rest. °· Most people can resume normal activities 1-2 days after the procedure. Ask your health care provider what activities are safe for you. °· If directed, strain all urine through the strainer that was provided by your health care provider. °? Keep all fragments for your health care provider to see. Any stones that are found may be sent to a medical lab for examination. The stone may be as small as a grain of salt. °· Keep all follow-up visits as told by your health care provider. This is important. °Contact a health care provider if: °· You have pain that is severe or does not get better with medicine. °· You have nausea that is severe or does not go away. °· You have blood in your urine longer than your health care provider told you to expect. °· You have more blood in your urine. °· You have pain during urination that does not go away. °· You urinate more frequently than usual and this does not go away. °· You develop a rash or any other possible signs of an allergic reaction. °Get help right away if: °· You have severe pain in   your back, sides, or upper abdomen. °· You have severe pain while urinating. °· Your urine is very dark red. °· You have blood in your stool (feces). °· You cannot pass any urine at all. °· You feel a strong urge to urinate after emptying your bladder. °· You have a fever or chills. °· You develop shortness of breath, difficulty breathing, or chest pain. °· You have severe nausea that leads to persistent vomiting. °· You faint. °Summary °· After this procedure, it is common to have some pain, discomfort, or nausea when pieces (fragments) of the  kidney stone move through the tube that carries urine from the kidney to the bladder (ureter). If this pain or nausea is severe, however, you should contact your health care provider. °· Most people can resume normal activities 1-2 days after the procedure. Ask your health care provider what activities are safe for you. °· Drink enough water and fluids to keep your urine clear or pale yellow. This helps any remaining pieces of the stone to pass, and it can help prevent new stones from forming. °· If directed, strain your urine and keep all fragments for your health care provider to see. Fragments or stones may be as small as a grain of salt. °· Get help right away if you have severe pain in your back, sides, or upper abdomen or have severe pain while urinating. °This information is not intended to replace advice given to you by your health care provider. Make sure you discuss any questions you have with your health care provider. °Document Released: 03/24/2007 Document Revised: 01/24/2016 Document Reviewed: 01/24/2016 °Elsevier Interactive Patient Education © 2017 [employer] ° °

## 2017-02-14 ENCOUNTER — Encounter: Payer: Self-pay | Admitting: Urology

## 2017-06-02 ENCOUNTER — Other Ambulatory Visit: Payer: Self-pay

## 2017-06-02 ENCOUNTER — Emergency Department: Payer: 59

## 2017-06-02 ENCOUNTER — Emergency Department
Admission: EM | Admit: 2017-06-02 | Discharge: 2017-06-02 | Disposition: A | Discharge status: Home / Self Care | Payer: 59 | Attending: Emergency Medicine | Admitting: Emergency Medicine

## 2017-06-02 DIAGNOSIS — Z79899 Other long term (current) drug therapy: Secondary | ICD-10-CM | POA: Insufficient documentation

## 2017-06-02 DIAGNOSIS — R1084 Generalized abdominal pain: Secondary | ICD-10-CM | POA: Diagnosis present

## 2017-06-02 DIAGNOSIS — N10 Acute tubulo-interstitial nephritis: Secondary | ICD-10-CM | POA: Insufficient documentation

## 2017-06-02 DIAGNOSIS — N12 Tubulo-interstitial nephritis, not specified as acute or chronic: Secondary | ICD-10-CM

## 2017-06-02 LAB — URINALYSIS, COMPLETE (UACMP) WITH MICROSCOPIC
Bilirubin Urine: NEGATIVE
GLUCOSE, UA: NEGATIVE mg/dL
KETONES UR: NEGATIVE mg/dL
Nitrite: POSITIVE — AB
PROTEIN: 100 mg/dL — AB
Specific Gravity, Urine: 1.01 (ref 1.005–1.030)
pH: 6 (ref 5.0–8.0)

## 2017-06-02 LAB — BASIC METABOLIC PANEL
Anion gap: 10 (ref 5–15)
BUN: 18 mg/dL (ref 6–20)
CALCIUM: 8 mg/dL — AB (ref 8.9–10.3)
CO2: 24 mmol/L (ref 22–32)
Chloride: 97 mmol/L — ABNORMAL LOW (ref 101–111)
Creatinine, Ser: 1.18 mg/dL — ABNORMAL HIGH (ref 0.44–1.00)
GFR, EST AFRICAN AMERICAN: 59 mL/min — AB (ref >60)
GFR, EST NON AFRICAN AMERICAN: 51 mL/min — AB (ref >60)
Glucose, Bld: 93 mg/dL (ref 65–99)
Potassium: 3.9 mmol/L (ref 3.5–5.1)
SODIUM: 131 mmol/L — AB (ref 135–145)

## 2017-06-02 LAB — CBC
HCT: 35 % (ref 35.0–47.0)
Hemoglobin: 11.6 g/dL — ABNORMAL LOW (ref 12.0–16.0)
MCH: 29.1 pg (ref 26.0–34.0)
MCHC: 33 g/dL (ref 32.0–36.0)
MCV: 88.3 fL (ref 80.0–100.0)
Platelets: 260 10*3/uL (ref 150–440)
RBC: 3.96 MIL/uL (ref 3.80–5.20)
RDW: 15 % — AB (ref 11.5–14.5)
WBC: 6.5 10*3/uL (ref 3.6–11.0)

## 2017-06-02 LAB — HEPATIC FUNCTION PANEL
ALBUMIN: 2.7 g/dL — AB (ref 3.5–5.0)
ALT: 16 U/L (ref 14–54)
AST: 16 U/L (ref 15–41)
Alkaline Phosphatase: 74 U/L (ref 38–126)
Bilirubin, Direct: 0.3 mg/dL (ref 0.1–0.5)
Indirect Bilirubin: 0.6 mg/dL (ref 0.3–0.9)
TOTAL PROTEIN: 6.9 g/dL (ref 6.5–8.1)
Total Bilirubin: 0.9 mg/dL (ref 0.3–1.2)

## 2017-06-02 LAB — LIPASE, BLOOD: LIPASE: 21 U/L (ref 11–51)

## 2017-06-02 MED ORDER — CEPHALEXIN 500 MG PO CAPS: 500.0000 mg | ORAL_CAPSULE | Freq: Three times a day (TID) | ORAL | 0 refills | Status: AC

## 2017-06-02 MED ORDER — CEPHALEXIN 500 MG PO CAPS
500.0000 mg | ORAL_CAPSULE | Freq: Once | ORAL | Status: AC
  Administered 2017-06-02: 500 mg via ORAL
  Filled 2017-06-02: qty 1

## 2017-06-02 NOTE — ED Notes (Signed)
Patient transported to CT 

## 2017-06-02 NOTE — ED Provider Notes (Signed)
[facility] Emergency Department Provider Note  ____________________________________________   First MD Initiated Contact with Patient 06/02/17 1929     (approximate)  I have reviewed the triage vital signs and the nursing notes.   HISTORY  Chief Complaint Abdominal Pain   HPI Heather Conrad is a 57 y.o. female with a history of acute renal failure as well as atrial fibrillation on amiodarone was presenting to the emergency department with right flank pain over the past 6 days. Says it is been associated with chills as well as nausea but no vomiting. She's been able tolerate fluids. Also says that she has about one episode of diarrhea per day. Also with a history of kidney stones as is concerned about possible kidney stones to the right side. Says that her pain is a 4-5 out of 10 and has been constant. When her doctor last week and was told that she had a viral illness and was discharged with Zofran. Patient without resolution of her symptoms which have now actually worsened.   Past Medical History:  Diagnosis Date  . Acute renal failure (ARF) ([place]) 01/2012  . Arthritis   . Dyspnea   . Dysrhythmia 01/2017   atrial fibrillation  . Heart murmur    per pt report  . History of MRSA infection 2013   leg, abdomen, arm  . Hyperlipidemia 2018  . Kidney stone 01/2017  . Obesity 01/2017  . Pyelonephritis   . UTI (urinary tract infection)     Patient Active Problem List   Diagnosis Date Noted  . UTI (lower urinary tract infection) 05/10/2011  . Sepsis due to Escherichia coli (E. coli) ([place]) 05/10/2011  . Nephrolithiasis 05/07/2011  . Acute renal failure ([place]) 05/07/2011    Past Surgical History:  Procedure Laterality Date  . CHOLECYSTECTOMY     1992  . EXTRACORPOREAL SHOCK WAVE LITHOTRIPSY Left 02/13/2017   Procedure: EXTRACORPOREAL SHOCK WAVE LITHOTRIPSY (ESWL);  Surgeon: Royston Cowper, MD;  Location: [facility] ORS;  Service: Urology;  Laterality:  Left;  . FOOT FRACTURE SURGERY Right 2018   repair of trigonem of ankle. endoscopic fasciotomy  . KNEE ARTHROSCOPY Right 2013   meniscus  . KNEE SURGERY Right    knee scope for torn meniscus    Prior to Admission medications   Medication Sig Start Date End Date Taking? Authorizing Provider  amiodarone (PACERONE) 200 MG tablet Take 200 mg by mouth every other day.     Neoma Laming A, MD  citalopram (CELEXA) 20 MG tablet Take 20 mg by mouth daily.    [provider]  meloxicam (MOBIC) 15 MG tablet Take 1 tablet (15 mg total) by mouth daily. Patient not taking: Reported on 02/13/2017 05/01/16   Hyatt, Max T, DPM  NUCYNTA 50 MG tablet Take 1 tablet (50 mg total) by mouth every 6 (six) hours as needed for moderate pain or severe pain. 1 TO 2 TABS Q 6 HOURS PRN PAIN 02/13/17   Royston Cowper, MD  ondansetron (ZOFRAN ODT) 8 MG disintegrating tablet Take 0.5 tablets (4 mg total) by mouth every 6 (six) hours as needed for nausea or vomiting. 02/13/17   Royston Cowper, MD  tamsulosin (FLOMAX) 0.4 MG CAPS capsule Take 1 capsule (0.4 mg total) by mouth daily. 02/13/17   Royston Cowper, MD  traMADol (ULTRAM) 50 MG tablet Take 1 tablet (50 mg total) by mouth every 8 (eight) hours as needed (Do not drive or operate machinery while taking as  can cause drowsiness.). Patient not taking: Reported on 02/13/2017 04/13/16   Marylene Land, NP    Allergies Patient has no known allergies.  Family History  Problem Relation Age of Onset  . Heart disease Father   . Hypertension Father   . Colon cancer Paternal Uncle   . Hypertension Paternal Uncle   . Diabetes Maternal Aunt   . Hypertension Maternal Aunt   . Heart disease Mother     Social History Social History   Tobacco Use  . Smoking status: Never Smoker  . Smokeless tobacco: Never Used  Substance Use Topics  . Alcohol use: No  . Drug use: No    Review of Systems  Constitutional: No fever/chills Eyes: No visual changes. ENT:  No sore throat. Cardiovascular: Denies chest pain. Respiratory: Denies shortness of breath. Gastrointestinal: no vomiting.  No diarrhea.  No constipation. Genitourinary: Negative for dysuria. Musculoskeletal: right flank pain. Skin: Negative for rash. Neurological: Negative for headaches, focal weakness or numbness.   ____________________________________________   PHYSICAL EXAM:  VITAL SIGNS: ED Triage Vitals  Enc Vitals Group     BP 06/02/17 1710 (!) 143/49     Pulse Rate 06/02/17 1710 62     Resp 06/02/17 1710 17     Temp 06/02/17 1710 98.4 F (36.9 C)     Temp Source 06/02/17 1936 Oral     SpO2 06/02/17 1710 99 %     Weight 06/02/17 1711 290 lb (131.5 kg)     Height 06/02/17 1711 5\' 3"  (1.6 m)     Head Circumference --      Peak Flow --      Pain Score 06/02/17 1710 6     Pain Loc --      Pain Edu? --      Excl. in [place]? --     Constitutional: Alert and oriented. Well appearing and in no acute distress. Eyes: Conjunctivae are normal.  Head: Atraumatic. Nose: No congestion/rhinnorhea. Mouth/Throat: Mucous membranes are moist.  Neck: No stridor.   Cardiovascular: Normal rate, regular rhythm. Grossly normal heart sounds.   Respiratory: Normal respiratory effort.  No retractions. Lungs CTAB. Gastrointestinal: Soft with minimal tenderness to the right upper as well as right lower quadrant. No rebound or guarding. No distention. mild right CVA tenderness to palpation. Musculoskeletal: No lower extremity tenderness nor edema.  No joint effusions. Neurologic:  Normal speech and language. No gross focal neurologic deficits are appreciated. Skin:  Skin is warm, dry and intact. No rash noted. Psychiatric: Mood and affect are normal. Speech and behavior are normal.  ____________________________________________   LABS (all labs ordered are listed, but only abnormal results are displayed)  Labs Reviewed  URINALYSIS, COMPLETE (UACMP) WITH MICROSCOPIC - Abnormal; Notable for  the following components:      Result Value   Color, Urine YELLOW (*)    APPearance CLOUDY (*)    Hgb urine dipstick LARGE (*)    Protein, ur 100 (*)    Nitrite POSITIVE (*)    Leukocytes, UA LARGE (*)    Bacteria, UA FEW (*)    Squamous Epithelial / LPF 0-5 (*)    Non Squamous Epithelial 0-5 (*)    All other components within normal limits  CBC - Abnormal; Notable for the following components:   Hemoglobin 11.6 (*)    RDW 15.0 (*)    All other components within normal limits  BASIC METABOLIC PANEL - Abnormal; Notable for the following components:   Sodium 131 (*)  Chloride 97 (*)    Creatinine, Ser 1.18 (*)    Calcium 8.0 (*)    GFR calc non Af Amer 51 (*)    GFR calc Af Amer 59 (*)    All other components within normal limits  HEPATIC FUNCTION PANEL - Abnormal; Notable for the following components:   Albumin 2.7 (*)    All other components within normal limits  URINE CULTURE  LIPASE, BLOOD   ____________________________________________  EKG   ____________________________________________  RADIOLOGY  Nephrolithiasis on CT of the abdomen and pelvis. ____________________________________________   PROCEDURES  Procedure(s) performed:   Procedures  Critical Care performed:   ____________________________________________   INITIAL IMPRESSION / ASSESSMENT AND PLAN / ED COURSE  Pertinent labs & imaging results that were available during my care of the patient were reviewed by me and considered in my medical decision making (see chart for details).  Differential diagnosis includes, but is not limited to, ovarian cyst, ovarian torsion, acute appendicitis, diverticulitis, urinary tract infection/pyelonephritis, endometriosis, bowel obstruction, colitis, renal colic, gastroenteritis, hernia, fibroids, endometriosis, pregnancy related pain including ectopic pregnancy, etc. As part of my medical decision making, I reviewed the following data within the electronic  MEDICAL RECORD NUMBER Notes from prior ED visits  ----------------------------------------- 9:23 PM on 06/02/2017 -----------------------------------------  Patient likely with pyelonephritis. Given first does of Keflex in the emergency department and will DC on 10 days of Keflex per patient is understanding the plan and willing to comply. Will follow back up with her primary care doctor. We'll continue Zofran as needed. Patient is nontoxic. Afebrile. Normal white blood cell count. Do not believe she is septic and in need of admission at this time. ____________________________________________   FINAL CLINICAL IMPRESSION(S) / ED DIAGNOSES  pyelonephritis.    NEW MEDICATIONS STARTED DURING THIS VISIT:  New Prescriptions   No medications on file     Note:  This document was prepared using Dragon voice recognition software and may include unintentional dictation errors.     Orbie Pyo, MD 06/02/17 2124

## 2017-06-02 NOTE — ED Notes (Signed)
Pt states she just went to the bathroom while in the waiting room. Water given and explained UA collection needed

## 2017-06-02 NOTE — ED Notes (Signed)
Reviewed discharge instructions, follow-up care, and prescriptions with patient. Patient verbalized understanding of all information reviewed. Patient stable, with no distress noted at this time.    

## 2017-06-02 NOTE — ED Notes (Signed)
Feeling sick x 1 week. Woke up last week with fever and chills. Went to her primary care dr on Thursday and told a virus. Pt still not feeling week. C/o abd pain, urinary sx and cough

## 2017-06-02 NOTE — ED Triage Notes (Signed)
Pt c/o right side/flank pain with N/V chills since last Tuesday and was seen by PCP but they told her she had a virus and did not check her urine. Pt states she has noticed a foul odor from urine and is not able to keep a lot down due to the nausea.Marland Kitchen

## 2017-06-05 LAB — URINE CULTURE: Culture: >=100000 — AB

## 2017-06-17 ENCOUNTER — Other Ambulatory Visit: Payer: Self-pay

## 2017-07-04 ENCOUNTER — Ambulatory Visit
Admission: RE | Admit: 2017-07-04 | Discharge: 2017-07-04 | Disposition: A | Discharge status: Home / Self Care | Payer: 59 | Source: Ambulatory Visit | Attending: Internal Medicine | Admitting: Internal Medicine

## 2017-07-04 ENCOUNTER — Other Ambulatory Visit: Payer: Self-pay | Admitting: Internal Medicine

## 2017-07-04 DIAGNOSIS — R921 Mammographic calcification found on diagnostic imaging of breast: Secondary | ICD-10-CM

## 2017-07-04 DIAGNOSIS — N6311 Unspecified lump in the right breast, upper outer quadrant: Secondary | ICD-10-CM | POA: Diagnosis not present

## 2017-07-04 DIAGNOSIS — N6001 Solitary cyst of right breast: Secondary | ICD-10-CM | POA: Insufficient documentation

## 2017-07-08 ENCOUNTER — Other Ambulatory Visit: Payer: Self-pay | Admitting: Internal Medicine

## 2017-07-08 DIAGNOSIS — N6310 Unspecified lump in the right breast, unspecified quadrant: Secondary | ICD-10-CM

## 2017-07-08 DIAGNOSIS — R928 Other abnormal and inconclusive findings on diagnostic imaging of breast: Secondary | ICD-10-CM

## 2017-07-16 ENCOUNTER — Other Ambulatory Visit: Payer: Self-pay | Admitting: Orthopedic Surgery

## 2017-07-16 ENCOUNTER — Ambulatory Visit
Admission: RE | Admit: 2017-07-16 | Discharge: 2017-07-16 | Disposition: A | Discharge status: Home / Self Care | Payer: 59 | Source: Ambulatory Visit | Attending: Internal Medicine | Admitting: Internal Medicine

## 2017-07-16 ENCOUNTER — Ambulatory Visit
Admission: RE | Admit: 2017-07-16 | Discharge: 2017-07-16 | Disposition: A | Discharge status: Home / Self Care | Payer: 59 | Source: Ambulatory Visit | Attending: Orthopedic Surgery | Admitting: Orthopedic Surgery

## 2017-07-16 DIAGNOSIS — M25562 Pain in left knee: Secondary | ICD-10-CM | POA: Insufficient documentation

## 2017-07-16 DIAGNOSIS — N6311 Unspecified lump in the right breast, upper outer quadrant: Secondary | ICD-10-CM | POA: Diagnosis present

## 2017-07-16 DIAGNOSIS — C50919 Malignant neoplasm of unspecified site of unspecified female breast: Secondary | ICD-10-CM

## 2017-07-16 DIAGNOSIS — M170 Bilateral primary osteoarthritis of knee: Secondary | ICD-10-CM | POA: Diagnosis not present

## 2017-07-16 DIAGNOSIS — C50411 Malignant neoplasm of upper-outer quadrant of right female breast: Secondary | ICD-10-CM | POA: Diagnosis not present

## 2017-07-16 DIAGNOSIS — M25561 Pain in right knee: Secondary | ICD-10-CM | POA: Insufficient documentation

## 2017-07-16 DIAGNOSIS — N6310 Unspecified lump in the right breast, unspecified quadrant: Secondary | ICD-10-CM

## 2017-07-16 DIAGNOSIS — R52 Pain, unspecified: Secondary | ICD-10-CM

## 2017-07-16 DIAGNOSIS — R928 Other abnormal and inconclusive findings on diagnostic imaging of breast: Secondary | ICD-10-CM

## 2017-07-16 HISTORY — PX: BREAST BIOPSY: SHX20

## 2017-07-16 HISTORY — DX: Malignant neoplasm of unspecified site of unspecified female breast: C50.919

## 2017-07-17 ENCOUNTER — Other Ambulatory Visit: Payer: Self-pay | Admitting: Pathology

## 2017-07-17 NOTE — Progress Notes (Unsigned)
mdt  

## 2017-07-25 ENCOUNTER — Encounter: Payer: Self-pay | Admitting: Oncology

## 2017-07-25 ENCOUNTER — Inpatient Hospital Stay: Payer: 59 | Attending: Oncology | Admitting: Oncology

## 2017-07-25 ENCOUNTER — Other Ambulatory Visit: Payer: Self-pay

## 2017-07-25 VITALS — BP 160/78 | HR 50 | Temp 98.4°F | Ht 63.5 in | Wt 299.0 lb

## 2017-07-25 DIAGNOSIS — Z800 Family history of malignant neoplasm of digestive organs: Secondary | ICD-10-CM | POA: Diagnosis not present

## 2017-07-25 DIAGNOSIS — Z170 Estrogen receptor positive status [ER+]: Secondary | ICD-10-CM | POA: Diagnosis not present

## 2017-07-25 DIAGNOSIS — Z7901 Long term (current) use of anticoagulants: Secondary | ICD-10-CM

## 2017-07-25 DIAGNOSIS — I4891 Unspecified atrial fibrillation: Secondary | ICD-10-CM

## 2017-07-25 DIAGNOSIS — C50411 Malignant neoplasm of upper-outer quadrant of right female breast: Secondary | ICD-10-CM | POA: Insufficient documentation

## 2017-07-25 DIAGNOSIS — Z803 Family history of malignant neoplasm of breast: Secondary | ICD-10-CM | POA: Diagnosis not present

## 2017-07-25 DIAGNOSIS — Z808 Family history of malignant neoplasm of other organs or systems: Secondary | ICD-10-CM

## 2017-07-25 DIAGNOSIS — Z79899 Other long term (current) drug therapy: Secondary | ICD-10-CM | POA: Diagnosis not present

## 2017-07-25 NOTE — Progress Notes (Signed)
  Oncology Nurse Navigator Documentation  Navigator Location: [facility] (07/25/17 1600) Referral date to RadOnc/MedOnc: 07/25/17 (07/25/17 1600) )Navigator Encounter Type: Initial MedOnc (07/25/17 1600)   Abnormal Finding Date: 07/04/17 (07/25/17 1600) Confirmed Diagnosis Date: 07/16/17 (07/25/17 1600)               Patient Visit Type: Initial (07/25/17 1600) Treatment Phase: Pre-Tx/Tx Discussion (07/25/17 1600) Barriers/Navigation Needs: Education;Coordination of Care (07/25/17 1600) Education: Understanding Cancer/ Treatment Options;Coping with Diagnosis/ Prognosis;Newly Diagnosed Cancer Education (07/25/17 1600) Interventions: Coordination of Care;Education (07/25/17 1600)   Coordination of Care: Appts (07/25/17 1600) Education Method: Written;Teach-back;Verbal (07/25/17 1600)                Time Spent with Patient: 60 (07/25/17 1600)  Met patient, sister, and brother-in-law at initial consult with Dr. Tasia Catchings.  ER/PR +/HER2 equivocal pending FISH.  Referral for Genetics Counselor to be scheduled.  Given Breast Cancer Treatment handbook/folder with hospital serviced.  Patient has appointment at [facility] in [place] this afternoon.  Information collected for Breast Conference.

## 2017-07-25 NOTE — Progress Notes (Addendum)
Hematology/Oncology Consult note [facility] [phone] [phone]   Patient Care Team: Perrin Maltese, MD as PCP - General (Internal Medicine)  REFERRING PROVIDER: Perrin Maltese, MD CHIEF COMPLAINTS/PURPOSE OF CONSULTATION:  Evaluation of breast cancer  HISTORY OF PRESENTING ILLNESS:  Heather Conrad is a  57 y.o.  female with PMH listed below who was referred to me for evaluation of breast cancer.  Recent screening Mammogram showed 1. Complex mass in the right breast, predominantly cystic, with abnormal tissue along its anterior wall. Biopsy is recommended. 2. Benign left breast calcifications.Sonographic evaluation of the right axilla shows no enlarged or abnormal lymph nodes. 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   She denies any breast pain, nipple discharge or breast skin changes.   Review of Systems  Constitutional: Negative for chills, fever, malaise/fatigue and weight loss.  HENT: Negative for congestion, ear discharge, ear pain, nosebleeds, sinus pain and sore throat.   Eyes: Negative for double vision, photophobia, pain, discharge and redness.  Respiratory: Negative for cough, hemoptysis, sputum production, shortness of breath and wheezing.   Cardiovascular: Negative for chest pain, palpitations, orthopnea, claudication and leg swelling.  Gastrointestinal: Negative for abdominal pain, blood in stool, constipation, diarrhea, heartburn, melena, nausea and vomiting.  Genitourinary: Negative for dysuria, flank pain, frequency and hematuria.  Musculoskeletal: Negative for back pain.  Skin: Negative for itching and rash.  Neurological: Negative for dizziness, tingling, tremors, focal weakness, weakness and headaches.  Endo/Heme/Allergies: Negative for environmental allergies. Does not bruise/bleed easily.  Psychiatric/Behavioral: Negative for depression  and hallucinations. The patient is not nervous/anxious.     MEDICAL HISTORY:  Past Medical History:  Diagnosis Date  . Acute renal failure (ARF) ([place]) 01/2012  . Arthritis   . Dyspnea   . Dysrhythmia 01/2017   atrial fibrillation  . Heart murmur    per pt report  . History of MRSA infection 2013   leg, abdomen, arm  . Hyperlipidemia 2018  . Kidney stone 01/2017  . Obesity 01/2017  . Pyelonephritis   . UTI (urinary tract infection)     SURGICAL HISTORY: Past Surgical History:  Procedure Laterality Date  . BREAST BIOPSY Right 07/16/2017   pending path  . CHOLECYSTECTOMY     1992  . EXTRACORPOREAL SHOCK WAVE LITHOTRIPSY Left 02/13/2017   Procedure: EXTRACORPOREAL SHOCK WAVE LITHOTRIPSY (ESWL);  Surgeon: Royston Cowper, MD;  Location: [facility] ORS;  Service: Urology;  Laterality: Left;  . FOOT FRACTURE SURGERY Right 2018   repair of trigonem of ankle. endoscopic fasciotomy  . KNEE ARTHROSCOPY Right 2013   meniscus  . KNEE SURGERY Right    knee scope for torn meniscus    SOCIAL HISTORY: Social History   Socioeconomic History  . Marital status: Divorced    Spouse name: Not on file  . Number of children: 2  . Years of education: Not on file  . Highest education level: Not on file  Occupational History  . Not on file  Social Needs  . Financial resource strain: Not on file  . Food insecurity:    Worry: Not on file    Inability: Not on file  . Transportation needs:    Medical: Not on file    Non-medical: Not on file  Tobacco Use  . Smoking status: Never Smoker  . Smokeless tobacco: Never Used  Substance and Sexual Activity  . Alcohol use: No  . Drug  use: No  . Sexual activity: Not on file  Lifestyle  . Physical activity:    Days per week: Not on file    Minutes per session: Not on file  . Stress: Not on file  Relationships  . Social connections:    Talks on phone: Not on file    Gets together: Not on file    Attends religious service: Not on file     Active member of club or organization: Not on file    Attends meetings of clubs or organizations: Not on file    Relationship status: Not on file  . Intimate partner violence:    Fear of current or ex partner: Not on file    Emotionally abused: Not on file    Physically abused: Not on file    Forced sexual activity: Not on file  Other Topics Concern  . Not on file  Social History Narrative  . Not on file    FAMILY HISTORY: Family History  Problem Relation Age of Onset  . Heart disease Father   . Hypertension Father   . Colon cancer Paternal Uncle   . Hypertension Paternal Uncle   . Diabetes Maternal Aunt   . Hypertension Maternal Aunt   . Heart disease Mother   . Hypertension Mother   . Uterine cancer Sister   . Breast cancer Cousin     ALLERGIES:  has No Known Allergies.  MEDICATIONS:  Current Outpatient Medications  Medication Sig Dispense Refill  . amiodarone (PACERONE) 200 MG tablet Take 200 mg by mouth every other day.     . Cholecalciferol (VITAMIN D3) 50000 units CAPS Take 50,000 Units by mouth once a week.  2  . citalopram (CELEXA) 40 MG tablet Take 40 mg by mouth daily.  6  . glucosamine-chondroitin 500-400 MG tablet Take 1 tablet by mouth 3 (three) times daily.    . Multiple Vitamin (MULTIVITAMIN) tablet Take 1 tablet by mouth daily.    . predniSONE (DELTASONE) 10 MG tablet Take 10 mg by mouth daily.  0  . traMADol (ULTRAM) 50 MG tablet Take 1 tablet (50 mg total) by mouth every 8 (eight) hours as needed (Do not drive or operate machinery while taking as can cause drowsiness.). 12 tablet 0  . XARELTO 20 MG TABS tablet Take 20 mg by mouth daily.  2  . metoprolol tartrate (LOPRESSOR) 50 MG tablet Take 50 mg by mouth 2 (two) times daily.  0   No current facility-administered medications for this visit.      PHYSICAL EXAMINATION: ECOG PERFORMANCE STATUS: 1 - Symptomatic but completely ambulatory Vitals:   07/25/17 1358  BP: (!) 160/78  Pulse: (!) 50  Temp:  98.4 F (36.9 C)   Filed Weights   07/25/17 1358  Weight: 299 lb (135.6 kg)    Physical Exam  Constitutional: She is oriented to person, place, and time. She appears well-developed and well-nourished. No distress.  Obese,   HENT:  Head: Normocephalic and atraumatic.  Right Ear: External ear normal.  Left Ear: External ear normal.  Mouth/Throat: Oropharynx is clear and moist.  Eyes: Pupils are equal, round, and reactive to light. Conjunctivae and EOM are normal. No scleral icterus.  Neck: Normal range of motion. Neck supple.  Cardiovascular: Normal rate, regular rhythm and normal heart sounds.  Pulmonary/Chest: Effort normal and breath sounds normal. No respiratory distress. She has no wheezes. She has no rales. She exhibits no tenderness.  Abdominal: Soft. Bowel sounds are normal. She  exhibits no distension and no mass. There is no tenderness.  Musculoskeletal: Normal range of motion. She exhibits no edema or deformity.  Lymphadenopathy:    She has no cervical adenopathy.  Neurological: She is alert and oriented to person, place, and time. No cranial nerve deficit. Coordination normal.  Skin: Skin is warm and dry. No rash noted.  Psychiatric: She has a normal mood and affect. Her behavior is normal. Thought content normal.   Breast exam was performed in seated and lying down position. Palpable right upper outer quadrant breast mass. . No evidence of axillary adenopathy.  LABORATORY DATA:  I have reviewed the data as listed Lab Results  Component Value Date   WBC 6.5 06/02/2017   HGB 11.6 (L) 06/02/2017   HCT 35.0 06/02/2017   MCV 88.3 06/02/2017   PLT 260 06/02/2017   Recent Labs    06/02/17 1736  NA 131*  K 3.9  CL 97*  CO2 24  GLUCOSE 93  BUN 18  CREATININE 1.18*  CALCIUM 8.0*  GFRNONAA 51*  GFRAA 59*  PROT 6.9  ALBUMIN 2.7*  AST 16  ALT 16  ALKPHOS 74  BILITOT 0.9  BILIDIR 0.3  IBILI 0.6       ASSESSMENT & PLAN:  1. Malignant neoplasm of right  breast in female, estrogen receptor positive, unspecified site of breast [facility])   Cancer Staging Malignant neoplasm of upper-outer quadrant of right breast in female, estrogen receptor positive ([place]) Staging form: Breast, AJCC 8th Edition - Clinical stage from 07/27/2017: Stage IA (cT1, cN0, cM0, G2, ER+, PR+, HER2: Equivocal) - Signed by Earlie Server, MD on 07/27/2017  Image was independantly reviewed and image/pathology were discussed with patient.  Cancer diagnosis was discussed.  She is ER+,PR+ and HER2 negative $# Given her family history of colon cancer, breast cancer and uterine cancer, refer to genetic counselor to discuss about testings. If no germline mutation which may alter surgical decision, , she will need lumpectomy with sentinel lymph node biopsy, followed by RT and eventually be started on adjuvant anti hormone therapy. Plan order Oncytpe for ER positive Lymphnode negative breast cancer to further guide the need of adjuvant chemotherapy.   Called patient's surgeon Dr.Newman and discuss with him about the management plan.  All questions were answered. The patient knows to call the clinic with any problems questions or concerns.  Return of visit: to be determined.  Thank you for this kind referral and the opportunity to participate in the care of this patient. A copy of today's note is routed to referring provider  Total face to face encounter time for this patient visit was 80 min. >50% of the time was  spent in counseling and coordination of care.   Earlie Server, MD, PhD Hematology Oncology [facility] Pager- [phone] 07/25/2017

## 2017-07-25 NOTE — Progress Notes (Signed)
Patient here today as a new patient  

## 2017-07-27 DIAGNOSIS — Z170 Estrogen receptor positive status [ER+]: Secondary | ICD-10-CM

## 2017-07-27 DIAGNOSIS — C50919 Malignant neoplasm of unspecified site of unspecified female breast: Secondary | ICD-10-CM | POA: Insufficient documentation

## 2017-07-27 DIAGNOSIS — C50411 Malignant neoplasm of upper-outer quadrant of right female breast: Secondary | ICD-10-CM | POA: Insufficient documentation

## 2017-07-28 ENCOUNTER — Encounter: Payer: Self-pay | Admitting: Oncology

## 2017-07-28 ENCOUNTER — Telehealth: Payer: Self-pay | Admitting: Genetic Counselor

## 2017-07-28 ENCOUNTER — Other Ambulatory Visit: Payer: Self-pay | Admitting: Genetic Counselor

## 2017-07-28 DIAGNOSIS — Z170 Estrogen receptor positive status [ER+]: Principal | ICD-10-CM

## 2017-07-28 DIAGNOSIS — C50411 Malignant neoplasm of upper-outer quadrant of right female breast: Secondary | ICD-10-CM

## 2017-07-28 LAB — SURGICAL PATHOLOGY

## 2017-07-28 NOTE — Telephone Encounter (Signed)
Dr. Tasia Catchings is referring Heather Conrad for genetic counseling due to a personal and family history of cancer. I spoke with Heather Conrad today and she chose to schedule this telegenetics visit on Tuesday, 07/29/17 at [place], [place], [facility] Genetic Counselor Phone: [phone]

## 2017-07-29 ENCOUNTER — Inpatient Hospital Stay: Payer: 59

## 2017-07-29 ENCOUNTER — Encounter: Payer: Self-pay | Admitting: Genetic Counselor

## 2017-07-29 ENCOUNTER — Telehealth: Payer: Self-pay | Admitting: Genetic Counselor

## 2017-07-29 DIAGNOSIS — C50411 Malignant neoplasm of upper-outer quadrant of right female breast: Secondary | ICD-10-CM

## 2017-07-29 DIAGNOSIS — Z170 Estrogen receptor positive status [ER+]: Principal | ICD-10-CM

## 2017-07-29 LAB — CBC WITH DIFFERENTIAL/PLATELET
BASOS ABS: 0.1 10*3/uL (ref 0–0.1)
Basophils Relative: 1 %
Eosinophils Absolute: 0.2 10*3/uL (ref 0–0.7)
Eosinophils Relative: 2 %
HCT: 40.9 % (ref 35.0–47.0)
Hemoglobin: 13.9 g/dL (ref 12.0–16.0)
LYMPHS PCT: 18 %
Lymphs Abs: 2.2 10*3/uL (ref 1.0–3.6)
MCH: 30.1 pg (ref 26.0–34.0)
MCHC: 34 g/dL (ref 32.0–36.0)
MCV: 88.5 fL (ref 80.0–100.0)
Monocytes Absolute: 0.9 10*3/uL (ref 0.2–0.9)
Monocytes Relative: 8 %
NEUTROS ABS: 8.8 10*3/uL — AB (ref 1.4–6.5)
Neutrophils Relative %: 71 %
Platelets: 305 10*3/uL (ref 150–440)
RBC: 4.62 MIL/uL (ref 3.80–5.20)
RDW: 15.4 % — ABNORMAL HIGH (ref 11.5–14.5)
WBC: 12.3 10*3/uL — AB (ref 3.6–11.0)

## 2017-07-29 LAB — COMPREHENSIVE METABOLIC PANEL
ALBUMIN: 4 g/dL (ref 3.5–5.0)
ALT: 15 U/L (ref 14–54)
ANION GAP: 11 (ref 5–15)
AST: 17 U/L (ref 15–41)
Alkaline Phosphatase: 66 U/L (ref 38–126)
BILIRUBIN TOTAL: 1 mg/dL (ref 0.3–1.2)
BUN: 39 mg/dL — ABNORMAL HIGH (ref 6–20)
CHLORIDE: 99 mmol/L — AB (ref 101–111)
CO2: 27 mmol/L (ref 22–32)
Calcium: 9.5 mg/dL (ref 8.9–10.3)
Creatinine, Ser: 1.29 mg/dL — ABNORMAL HIGH (ref 0.44–1.00)
GFR calc Af Amer: 53 mL/min — ABNORMAL LOW (ref >60)
GFR calc non Af Amer: 46 mL/min — ABNORMAL LOW (ref >60)
GLUCOSE: 105 mg/dL — AB (ref 65–99)
POTASSIUM: 4.1 mmol/L (ref 3.5–5.1)
Sodium: 137 mmol/L (ref 135–145)
Total Protein: 7.3 g/dL (ref 6.5–8.1)

## 2017-07-29 NOTE — Telephone Encounter (Signed)
Cancer Genetics            Telegenetics Initial Visit    Patient Name: Heather Conrad Patient DOB: [birth year] Patient Age: 57 y.o. Phone Call Date: 07/29/2017  Referring Provider: Earlie Server, Conrad  Reason for Visit: Evaluate for hereditary susceptibility to cancer    Assessment and Plan:  . Heather Conrad's history of breast cancer at age 68 in combination with her very large family and many unaffected relatives is not suggestive of a hereditary predisposition to cancer. Her mother had 7 siblings and her father had 63 siblings.   . Testing is available to her to determine whether she has a pathogenic mutation that will impact her screening and risk-reduction for cancer, but she understood her risk is low. She does not meet [facility]'s criteria for testing at this time, but we discussed the option to self-pay ($250).  . Heather Conrad did not wish to pursue genetic testing today. She stated it is not because of the cost of testing, but rather her risk level. This is a reasonable decision given her low risk, but we discussed that the information can be reassuring to her as well as her daughters. She is welcome to contact me if she changes her mind in the future.  . Heather Conrad is encouraged to remain in contact with Cancer Genetics annually so that we can update the family history and inform her of any changes in cancer genetics and testing that may be of benefit for this family.     Heather Conrad was available for questions concerning this case. Total time spent by counseling by phone was approximately 20 minutes.   _____________________________________________________________________   History of Present Illness: Ms. Heather Conrad, a 57 y.o. female, was referred for genetic counseling to discuss the possibility of a hereditary predisposition to cancer and discuss whether genetic testing is warranted. This was a telegenetics visit via phone.  Heather Conrad was recently  diagnosed with breast cancer at the age of 24. She has not yet had surgery, but indicated that genetic testing will not influence her decision.     Malignant neoplasm of upper-outer quadrant of right breast in female, estrogen receptor positive ([place])   07/27/2017 Initial Diagnosis    Malignant neoplasm of upper-outer quadrant of right breast in female, estrogen receptor positive ([place])      07/27/2017 Cancer Staging    Staging form: Breast, AJCC 8th Edition - Clinical stage from 07/27/2017: Stage IA (cT1, cN0, cM0, G2, ER+, PR+, HER2: Equivocal) - Signed by Heather Conrad on 07/27/2017       Past Medical History:  Diagnosis Date  . Acute renal failure (ARF) ([place]) 01/2012  . Arthritis   . Dyspnea   . Dysrhythmia 01/2017   atrial fibrillation  . Heart murmur    per pt report  . History of MRSA infection 2013   leg, abdomen, arm  . Hyperlipidemia 2018  . Kidney stone 01/2017  . Obesity 01/2017  . Pyelonephritis   . UTI (urinary tract infection)     Past Surgical History:  Procedure Laterality Date  . BREAST BIOPSY Right 07/16/2017   pending path  . CHOLECYSTECTOMY     1992  . EXTRACORPOREAL SHOCK WAVE LITHOTRIPSY Left 02/13/2017   Procedure: EXTRACORPOREAL SHOCK WAVE LITHOTRIPSY (ESWL);  Surgeon: Heather Cowper, Conrad;  Location: [facility] ORS;  Service: Urology;  Laterality: Left;  . FOOT FRACTURE SURGERY Right 2018  repair of trigonem of ankle. endoscopic fasciotomy  . KNEE ARTHROSCOPY Right 2013   meniscus  . KNEE SURGERY Right    knee scope for torn meniscus    Family History: Significant diagnoses include the following:  Family History  Problem Relation Age of Onset  . Heart disease Father        deceased 26; had 86 siblings  . Hypertension Father   . Colon cancer Paternal Uncle 54       deceased 59s  . Hypertension Paternal Uncle   . Diabetes Maternal Aunt   . Hypertension Maternal Aunt   . Heart disease Mother        deceased at 47; had 7 siblings  .  Hypertension Mother   . Uterine cancer Sister 71       currently 84; no children  . Breast cancer Cousin 86       currently 29; daughter of unaffected maternal aunt    Additionally, Heather Conrad has 2 daughters (ages 79 and 63). Her sister (noted above) has no children. Her mother died at 61, cancer-free. She had 4 sisters and 3 brothers who were all cancer-free. Her father died at 37, cancer-free. He had a total of 15 siblings. There are no cancers reported in her grandparents.  Heather Conrad ancestry is Caucasian - NOS. There is no known Jewish ancestry and no consanguinity.  Discussion: We reviewed the characteristics, features and inheritance patterns of hereditary cancer syndromes. We discussed her risk of harboring a mutation in the context of her personal and family history. We discussed the process of genetic testing, insurance coverage and implications of results: positive, negative and variant of unknown significance (VUS).   Heather Conrad questions were answered to her satisfaction today and she is welcome to call with any additional questions or concerns. Thank you for the referral and allowing [place] to share in the care of your patient.    Heather Berg, MS, [place] Certified Genetic Counselor phone: [phone]

## 2017-07-29 NOTE — Progress Notes (Signed)
  Oncology Nurse Navigator Documentation  Navigator Location: [facility] (07/29/17 1100)   )Navigator Encounter Type: Telephone (07/29/17 1100) Telephone: Lahoma Crocker Call;Appt Confirmation/Clarification (07/29/17 1100)                   Patient Visit Type: Follow-up (07/29/17 1100) Treatment Phase: Pre-Tx/Tx Discussion (07/29/17 1100) Barriers/Navigation Needs: Coordination of Care (07/29/17 1100) Education: Preparing for Upcoming Surgery/ Treatment (07/29/17 1100)     Coordination of Care: Appts (07/29/17 1100)                  Time Spent with Patient: 15 (07/29/17 1100)   Phoned patient, and she was pleased with Surgical Consult with Dr. Lucia Gaskins at [facility].  States had lab work yesterday, and will call back with date of surgery when it is scheduled.

## 2017-07-30 ENCOUNTER — Other Ambulatory Visit: Payer: Self-pay | Admitting: Orthopedic Surgery

## 2017-07-30 ENCOUNTER — Other Ambulatory Visit: Payer: Self-pay

## 2017-07-30 DIAGNOSIS — M25562 Pain in left knee: Secondary | ICD-10-CM

## 2017-07-30 DIAGNOSIS — M25552 Pain in left hip: Secondary | ICD-10-CM

## 2017-07-30 LAB — CANCER ANTIGEN 15-3: CAN 15 3: 9.5 U/mL (ref 0.0–25.0)

## 2017-07-30 LAB — CANCER ANTIGEN 27.29: CAN 27.29: 14.4 U/mL (ref 0.0–38.6)

## 2017-08-01 ENCOUNTER — Other Ambulatory Visit: Payer: Self-pay | Admitting: Orthopedic Surgery

## 2017-08-01 DIAGNOSIS — M25562 Pain in left knee: Secondary | ICD-10-CM

## 2017-08-03 ENCOUNTER — Ambulatory Visit
Admission: RE | Admit: 2017-08-03 | Discharge: 2017-08-03 | Disposition: A | Discharge status: Home / Self Care | Payer: 59 | Source: Ambulatory Visit | Attending: Orthopedic Surgery | Admitting: Orthopedic Surgery

## 2017-08-03 DIAGNOSIS — M25562 Pain in left knee: Secondary | ICD-10-CM

## 2017-08-04 ENCOUNTER — Other Ambulatory Visit: Payer: Self-pay | Admitting: Orthopedic Surgery

## 2017-08-05 ENCOUNTER — Ambulatory Visit: Payer: Self-pay | Admitting: Surgery

## 2017-08-05 DIAGNOSIS — Z170 Estrogen receptor positive status [ER+]: Principal | ICD-10-CM

## 2017-08-05 DIAGNOSIS — C50911 Malignant neoplasm of unspecified site of right female breast: Secondary | ICD-10-CM

## 2017-08-06 ENCOUNTER — Other Ambulatory Visit: Payer: Self-pay | Admitting: Orthopedic Surgery

## 2017-08-06 ENCOUNTER — Ambulatory Visit
Admission: RE | Admit: 2017-08-06 | Discharge: 2017-08-06 | Disposition: A | Discharge status: Home / Self Care | Payer: 59 | Source: Ambulatory Visit | Attending: Orthopedic Surgery | Admitting: Orthopedic Surgery

## 2017-08-06 DIAGNOSIS — M25562 Pain in left knee: Secondary | ICD-10-CM

## 2017-08-06 DIAGNOSIS — M160 Bilateral primary osteoarthritis of hip: Secondary | ICD-10-CM | POA: Insufficient documentation

## 2017-08-06 DIAGNOSIS — M47818 Spondylosis without myelopathy or radiculopathy, sacral and sacrococcygeal region: Secondary | ICD-10-CM | POA: Insufficient documentation

## 2017-08-06 DIAGNOSIS — M25552 Pain in left hip: Secondary | ICD-10-CM

## 2017-08-06 DIAGNOSIS — M5136 Other intervertebral disc degeneration, lumbar region: Secondary | ICD-10-CM | POA: Insufficient documentation

## 2017-08-08 ENCOUNTER — Ambulatory Visit: Payer: 59

## 2017-08-13 ENCOUNTER — Encounter: Payer: Self-pay | Admitting: Radiation Oncology

## 2017-08-13 ENCOUNTER — Ambulatory Visit
Admission: RE | Admit: 2017-08-13 | Discharge: 2017-08-13 | Disposition: A | Discharge status: Home / Self Care | Payer: 59 | Source: Ambulatory Visit | Attending: Radiation Oncology | Admitting: Radiation Oncology

## 2017-08-13 ENCOUNTER — Other Ambulatory Visit: Payer: Self-pay | Admitting: Orthopedic Surgery

## 2017-08-13 ENCOUNTER — Other Ambulatory Visit: Payer: Self-pay

## 2017-08-13 VITALS — BP 144/80 | HR 61 | Temp 97.2°F | Resp 18 | Wt 289.0 lb

## 2017-08-13 DIAGNOSIS — N179 Acute kidney failure, unspecified: Secondary | ICD-10-CM | POA: Insufficient documentation

## 2017-08-13 DIAGNOSIS — R011 Cardiac murmur, unspecified: Secondary | ICD-10-CM | POA: Insufficient documentation

## 2017-08-13 DIAGNOSIS — M545 Low back pain, unspecified: Secondary | ICD-10-CM

## 2017-08-13 DIAGNOSIS — Z87442 Personal history of urinary calculi: Secondary | ICD-10-CM | POA: Diagnosis not present

## 2017-08-13 DIAGNOSIS — E785 Hyperlipidemia, unspecified: Secondary | ICD-10-CM | POA: Diagnosis not present

## 2017-08-13 DIAGNOSIS — Z79899 Other long term (current) drug therapy: Secondary | ICD-10-CM | POA: Diagnosis not present

## 2017-08-13 DIAGNOSIS — Z803 Family history of malignant neoplasm of breast: Secondary | ICD-10-CM | POA: Diagnosis not present

## 2017-08-13 DIAGNOSIS — I4891 Unspecified atrial fibrillation: Secondary | ICD-10-CM | POA: Insufficient documentation

## 2017-08-13 DIAGNOSIS — E669 Obesity, unspecified: Secondary | ICD-10-CM | POA: Insufficient documentation

## 2017-08-13 DIAGNOSIS — Z7901 Long term (current) use of anticoagulants: Secondary | ICD-10-CM | POA: Diagnosis not present

## 2017-08-13 DIAGNOSIS — Z8614 Personal history of Methicillin resistant Staphylococcus aureus infection: Secondary | ICD-10-CM | POA: Diagnosis not present

## 2017-08-13 DIAGNOSIS — Z800 Family history of malignant neoplasm of digestive organs: Secondary | ICD-10-CM | POA: Insufficient documentation

## 2017-08-13 DIAGNOSIS — N6311 Unspecified lump in the right breast, upper outer quadrant: Secondary | ICD-10-CM | POA: Diagnosis not present

## 2017-08-13 DIAGNOSIS — Z170 Estrogen receptor positive status [ER+]: Secondary | ICD-10-CM

## 2017-08-13 DIAGNOSIS — C50411 Malignant neoplasm of upper-outer quadrant of right female breast: Secondary | ICD-10-CM

## 2017-08-13 DIAGNOSIS — M129 Arthropathy, unspecified: Secondary | ICD-10-CM | POA: Diagnosis not present

## 2017-08-13 DIAGNOSIS — R0602 Shortness of breath: Secondary | ICD-10-CM | POA: Insufficient documentation

## 2017-08-13 NOTE — Consult Note (Signed)
NEW PATIENT EVALUATION  Name: Heather Conrad  MRN: [id number]  Date:   08/13/2017     DOB: Oct 07, 1960   This 57 y.o. female patient presents to the clinic for initial evaluation of probable stage I (T1 N0 M0) ER/PR positive HER-2/neu negative invasive mammary carcinoma presurgical evaluation.  REFERRING PHYSICIAN: Perrin Maltese, MD  CHIEF COMPLAINT:  Chief Complaint  Patient presents with  . Breast Cancer    Initial Eval    DIAGNOSIS: The encounter diagnosis was Malignant neoplasm of upper-outer quadrant of right breast in female, estrogen receptor positive ([place]).   PREVIOUS INVESTIGATIONS:  Pathology reports reviewed Mammogram ultrasound reviewed Clinical notes reviewed  HPI: patient is a 57 year old female who presented with an abnormal mammogram of her right breast showing a predominance cystic abnormal tissue along the anterior wall for which ultrasound-guidedbiopsy was recommended.pathology was positive for ER/PR positiveoverall grade 2 invasive mammary carcinoma HER-2/neu was negative. Overall tumor size was1 cm on ultrasound.patient is pending wide local excision and sentinel node biopsy by surgeon. She is seen today for presurgical consultation. She is already seen medical oncology. Genetic testing was ordered although not covered by her insurancecarrier it is pending. She also is wheelchair-bound she's having problems with arthritis presumed in her lower extremities. Medical oncology is also planning an Oncotype for guidance as far as systemic chemotherapy is concerned. She is seen today and doing well she is asymptomatic from a breast standpoint.she does have comorbidities including acute renal failure arthritis atrial fibrillationhyperlipidemia and obesity.  PLANNED TREATMENT REGIMEN: wide local excision sentinel node biopsy and adjuvant whole breast radiation plus antiestrogen therapy plus minus systemic chemotherapy based on Oncotype DX results  PAST MEDICAL HISTORY:  has a  past medical history of Acute renal failure (ARF) ([place]) (01/2012), Arthritis, Dyspnea, Dysrhythmia (01/2017), Heart murmur, History of MRSA infection (2013), Hyperlipidemia (2018), Kidney stone (01/2017), Obesity (01/2017), Pyelonephritis, and UTI (urinary tract infection).    PAST SURGICAL HISTORY:  Past Surgical History:  Procedure Laterality Date  . BREAST BIOPSY Right 07/16/2017   pending path  . CHOLECYSTECTOMY     1992  . EXTRACORPOREAL SHOCK WAVE LITHOTRIPSY Left 02/13/2017   Procedure: EXTRACORPOREAL SHOCK WAVE LITHOTRIPSY (ESWL);  Surgeon: Royston Cowper, MD;  Location: [facility] ORS;  Service: Urology;  Laterality: Left;  . FOOT FRACTURE SURGERY Right 2018   repair of trigonem of ankle. endoscopic fasciotomy  . KNEE ARTHROSCOPY Right 2013   meniscus  . KNEE SURGERY Right    knee scope for torn meniscus    FAMILY HISTORY: family history includes Breast cancer (age of onset: 56) in her cousin; Colon cancer (age of onset: 52) in her paternal uncle; Diabetes in her maternal aunt; Heart disease in her father and mother; Hypertension in her father, maternal aunt, mother, and paternal uncle; Uterine cancer (age of onset: 61) in her sister.  SOCIAL HISTORY:  reports that she has never smoked. She has never used smokeless tobacco. She reports that she does not drink alcohol or use drugs.  ALLERGIES: Patient has no known allergies.  MEDICATIONS:  Current Outpatient Medications  Medication Sig Dispense Refill  . amiodarone (PACERONE) 200 MG tablet Take 200 mg by mouth every other day.     . Cholecalciferol (VITAMIN D3) 50000 units CAPS Take 50,000 Units by mouth once a week.  2  . citalopram (CELEXA) 40 MG tablet Take 40 mg by mouth daily.  6  . glucosamine-chondroitin 500-400 MG tablet Take 1 tablet by mouth 3 (three) times daily.    Marland Kitchen  metoprolol tartrate (LOPRESSOR) 50 MG tablet Take 50 mg by mouth 2 (two) times daily.  0  . Multiple Vitamin (MULTIVITAMIN) tablet Take 1 tablet by mouth  daily.    . predniSONE (DELTASONE) 10 MG tablet Take 10 mg by mouth daily.  0  . traMADol (ULTRAM) 50 MG tablet Take 1 tablet (50 mg total) by mouth every 8 (eight) hours as needed (Do not drive or operate machinery while taking as can cause drowsiness.). 12 tablet 0  . XARELTO 20 MG TABS tablet Take 20 mg by mouth daily.  2   No current facility-administered medications for this encounter.     ECOG PERFORMANCE STATUS:  0 - Asymptomatic  REVIEW OF SYSTEMS:  Patient denies any weight loss, fatigue, weakness, fever, chills or night sweats. Patient denies any loss of vision, blurred vision. Patient denies any ringing  of the ears or hearing loss. No irregular heartbeat. Patient denies heart murmur or history of fainting. Patient denies any chest pain or pain radiating to her upper extremities. Patient denies any shortness of breath, difficulty breathing at night, cough or hemoptysis. Patient denies any swelling in the lower legs. Patient denies any nausea vomiting, vomiting of blood, or coffee ground material in the vomitus. Patient denies any stomach pain. Patient states has had normal bowel movements no significant constipation or diarrhea. Patient denies any dysuria, hematuria or significant nocturia. Patient denies any problems walking, swelling in the joints or loss of balance. Patient denies any skin changes, loss of hair or loss of weight. Patient denies any excessive worrying or anxiety or significant depression. Patient denies any problems with insomnia. Patient denies excessive thirst, polyuria, polydipsia. Patient denies any swollen glands, patient denies easy bruising or easy bleeding. Patient denies any recent infections, allergies or URI. Patient "s visual fields have not changed significantly in recent time.    PHYSICAL EXAM: BP (!) 144/80   Pulse 61   Temp (!) 97.2 F (36.2 C)   Resp 18   Wt 289 lb 0.4 oz (131.1 kg)   BMI 50.40 kg/m  Breast exam is unremarkable no detectable mass  or nodularity is noted in either breast in 2 positions examined. No axillary or supraclavicular adenopathy is appreciated. Well-developed well-nourished patient in NAD. HEENT reveals PERLA, EOMI, discs not visualized.  Oral cavity is clear. No oral mucosal lesions are identified. Neck is clear without evidence of cervical or supraclavicular adenopathy. Lungs are clear to A&P. Cardiac examination is essentially unremarkable with regular rate and rhythm without murmur rub or thrill. Abdomen is benign with no organomegaly or masses noted. Motor sensory and DTR levels are equal and symmetric in the upper and lower extremities. Cranial nerves II through XII are grossly intact. Proprioception is intact. No peripheral adenopathy or edema is identified. No motor or sensory levels are noted. Crude visual fields are within normal range.  LABORATORY DATA: initial pathology reports reviewed    RADIOLOGY RESULTS:mammogram and ultrasound reviewed   IMPRESSION: probable stage I invasive mammary carcinoma of the ight breastpending wide local excision and sentinel node biopsy in 57 year old female  PLAN: at this time await final pathology results before rendering a final opinion. Based onknown informationpatient probably benefit from whole breast radiation. Patient has large pendulous breasts making hypofractionated regiment difficult. Would probably recommend 5040 cGy in 28 fractions boosting her scar another 1400 cGy using electron beam. Risks and benefits of treatment including skin reaction fatigue alteration of blood counts possible inclusion of superficial lung all were discussed in detail  with the patient. She seems to comprehend my treatment plan well. We'll see her again in follow-up after her wide local excision. Patient knows to call at anytime with concerns. She also will benefit from anti-estrogen therapy after completion of treatment.  I would like to take this opportunity to thank you for allowing me to  participate in the care of your patient.Heather Filbert, MD

## 2017-08-14 ENCOUNTER — Encounter: Payer: Self-pay | Admitting: Oncology

## 2017-08-14 ENCOUNTER — Other Ambulatory Visit: Payer: Self-pay

## 2017-08-14 DIAGNOSIS — Z7189 Other specified counseling: Secondary | ICD-10-CM | POA: Insufficient documentation

## 2017-08-15 ENCOUNTER — Other Ambulatory Visit: Payer: Self-pay | Admitting: Surgery

## 2017-08-15 DIAGNOSIS — Z170 Estrogen receptor positive status [ER+]: Principal | ICD-10-CM

## 2017-08-15 DIAGNOSIS — C50911 Malignant neoplasm of unspecified site of right female breast: Secondary | ICD-10-CM

## 2017-08-16 ENCOUNTER — Ambulatory Visit
Admission: RE | Admit: 2017-08-16 | Discharge: 2017-08-16 | Disposition: A | Discharge status: Home / Self Care | Payer: 59 | Source: Ambulatory Visit | Attending: Orthopedic Surgery | Admitting: Orthopedic Surgery

## 2017-08-16 DIAGNOSIS — M545 Low back pain, unspecified: Secondary | ICD-10-CM

## 2017-08-16 MED ORDER — GADOBENATE DIMEGLUMINE 529 MG/ML IV SOLN
20.0000 mL | Freq: Once | INTRAVENOUS | Status: AC | PRN
  Administered 2017-08-16: 20 mL via INTRAVENOUS

## 2017-08-21 ENCOUNTER — Other Ambulatory Visit: Payer: 59

## 2017-08-22 ENCOUNTER — Encounter (HOSPITAL_COMMUNITY)
Admission: RE | Admit: 2017-08-22 | Discharge: 2017-08-22 | Disposition: A | Discharge status: Home / Self Care | Payer: 59 | Source: Ambulatory Visit | Attending: Surgery | Admitting: Surgery

## 2017-08-22 ENCOUNTER — Encounter (HOSPITAL_COMMUNITY): Payer: Self-pay

## 2017-08-22 DIAGNOSIS — Z9889 Other specified postprocedural states: Secondary | ICD-10-CM | POA: Insufficient documentation

## 2017-08-22 DIAGNOSIS — Z79899 Other long term (current) drug therapy: Secondary | ICD-10-CM | POA: Insufficient documentation

## 2017-08-22 DIAGNOSIS — Z79891 Long term (current) use of opiate analgesic: Secondary | ICD-10-CM | POA: Diagnosis not present

## 2017-08-22 DIAGNOSIS — I340 Nonrheumatic mitral (valve) insufficiency: Secondary | ICD-10-CM | POA: Diagnosis not present

## 2017-08-22 DIAGNOSIS — Z87442 Personal history of urinary calculi: Secondary | ICD-10-CM | POA: Diagnosis not present

## 2017-08-22 DIAGNOSIS — Z9049 Acquired absence of other specified parts of digestive tract: Secondary | ICD-10-CM | POA: Insufficient documentation

## 2017-08-22 DIAGNOSIS — Z7901 Long term (current) use of anticoagulants: Secondary | ICD-10-CM | POA: Diagnosis not present

## 2017-08-22 DIAGNOSIS — Z01812 Encounter for preprocedural laboratory examination: Secondary | ICD-10-CM | POA: Diagnosis not present

## 2017-08-22 HISTORY — DX: Anxiety disorder, unspecified: F41.9

## 2017-08-22 LAB — BASIC METABOLIC PANEL
ANION GAP: 9 (ref 5–15)
BUN: 20 mg/dL (ref 6–20)
CALCIUM: 9.1 mg/dL (ref 8.9–10.3)
CO2: 28 mmol/L (ref 22–32)
CREATININE: 1.28 mg/dL — AB (ref 0.44–1.00)
Chloride: 104 mmol/L (ref 101–111)
GFR, EST AFRICAN AMERICAN: 54 mL/min — AB (ref >60)
GFR, EST NON AFRICAN AMERICAN: 46 mL/min — AB (ref >60)
Glucose, Bld: 92 mg/dL (ref 65–99)
Potassium: 4.3 mmol/L (ref 3.5–5.1)
SODIUM: 141 mmol/L (ref 135–145)

## 2017-08-22 LAB — CBC
HCT: 39.7 % (ref 36.0–46.0)
Hemoglobin: 12.8 g/dL (ref 12.0–15.0)
MCH: 29.2 pg (ref 26.0–34.0)
MCHC: 32.2 g/dL (ref 30.0–36.0)
MCV: 90.4 fL (ref 78.0–100.0)
PLATELETS: 251 10*3/uL (ref 150–400)
RBC: 4.39 MIL/uL (ref 3.87–5.11)
RDW: 14 % (ref 11.5–15.5)
WBC: 7.9 10*3/uL (ref 4.0–10.5)

## 2017-08-22 MED ORDER — CHLORHEXIDINE GLUCONATE CLOTH 2 % EX PADS: 6.0000 | MEDICATED_PAD | Freq: Once | CUTANEOUS | Status: DC

## 2017-08-22 NOTE — Pre-Procedure Instructions (Signed)
    Heather Conrad  08/22/2017      [facility] [place], [place] - [address] [place] [address] Phone: [phone] Fax: [phone]  [facility] [place], [place] [address] [place] [address] Phone: [phone] Fax: [phone]    Your procedure is scheduled on 09/01/17.  Report to [facility] Admitting at 530 A.M.  Call this number if you have problems the morning of surgery:  [phone]   Remember:  No food after midnight.     Take these medicines the morning of surgery with A SIP OF WATER --amiodarone,celexa,hydrocodone    Do not wear jewelry, make-up or nail polish.  Do not wear lotions, powders, or perfumes, or deodorant.  Do not shave 48 hours prior to surgery.  Men may shave face and neck.  Do not bring valuables to the hospital.  [facility] is not responsible for any belongings or valuables.  Contacts, dentures or bridgework may not be worn into surgery.  Leave your suitcase in the car.  After surgery it may be brought to your room.  For patients admitted to the hospital, discharge time will be determined by your treatment team.  Patients discharged the day of surgery will not be allowed to drive home.   Name and phone number of your driver:    Special instructions:  Do not take any aspirin,anti-inflammatories,vitamins,or herbal supplements 5-7 days prior to surgery.Please complete your PRE-SURGERY ENSURE that was given to before you leave your house the morning of surgery.  Please, if able, drink it in one setting. DO NOT SIP.  Please read over the following fact sheets that you were given.

## 2017-08-25 NOTE — Progress Notes (Signed)
Anesthesia Chart Review:   Case:  [id number] Date/Time:  09/01/17 0715   Procedure:  BREAST LUMPECTOMY WITH RADIOACTIVE SEED AND SENTINEL LYMPH NODE BIOPSY (Right Breast)   Anesthesia type:  General   Pre-op diagnosis:  RIGHT BREAST CANCER   Location:  [facility] OR ROOM 09 / [place] OR   Surgeon:  Alphonsa Overall, MD      DISCUSSION: - Pt is a 57 year old female with hx atrial fibrillation, moderate mitral regurgitation  - Pt has cardiac clearance for surgery  - Pt to stop xarelto 3 days prior to surgery   VS: BP 128/64   Pulse 62   Temp 36.8 C   Resp 18   Ht 5\' 4"  (1.626 m)   Wt 293 lb 14.4 oz (133.3 kg)   SpO2 97%   BMI 50.45 kg/m    PROVIDERS: PCP is  Perrin Maltese, MD Cardiologist is Neoma Laming, MD who cleared pt for surgery at last office visit 07/25/17   LABS: Labs reviewed: Acceptable for surgery. (all labs ordered are listed, but only abnormal results are displayed)  Labs Reviewed  BASIC METABOLIC PANEL - Abnormal; Notable for the following components:      Result Value   Creatinine, Ser 1.28 (*)    GFR calc non Af Amer 46 (*)    GFR calc Af Amer 54 (*)    All other components within normal limits  CBC    EKG 06/27/17 (Dr. Laurelyn Sickle office): sinus bradycardia (48 bpm). LAD. Minimal voltage criteria for LVH, may be normal variant. Anterior infarct, age undetermined. ST & T wave abnormality, consider lateral ischemia   CV:  Cardiac CTA 07/23/17 (Dr. Laurelyn Sickle office):  1.  Quality of study: Excellent 2.  Calcium score 0 3.  Right dominant system. 4.  Normal coronary  Echo 07/22/17 (Dr. Laurelyn Sickle office):  - Technically difficult study due to body habitus - Severely  Dilated LA and mildly dilated RA with ventricles and aorta appearing normal in size - Normal LV systolic function. Normal wall motion. Mild LVH with GRADE I diastolic dysfunction.  -Trace pulmonary regurgitation - Mild tricuspid regurgitation - Normal pulmonary artery pressure - Moderate mitral regurgitation.    - Mild to moderate aortic regurgitation - No pericardial effusion - No evidence of thrombi - Incidental finding on the left lobe of liver; rounded echogenic measuring 3.8 x 3.5 cm  Nuclear stress test 07/18/2017 (Dr. Sharol Roussel office):  - Equivocal stress test with normal EF, submaximal stress. - Cardiac CTA ordered, see above   Past Medical History:  Diagnosis Date  . Acute renal failure (ARF) ([place]) 01/2012  . Anxiety   . Arthritis   . Dyspnea   . Dysrhythmia 01/2017   atrial fibrillation  . Heart murmur    per pt report  . History of MRSA infection 2013   leg, abdomen, arm  . Hyperlipidemia 2018  . Kidney stone 01/2017  . Obesity 01/2017  . Pyelonephritis   . UTI (urinary tract infection)     Past Surgical History:  Procedure Laterality Date  . BREAST BIOPSY Right 07/16/2017   pending path  . CHOLECYSTECTOMY     1992  . EXTRACORPOREAL SHOCK WAVE LITHOTRIPSY Left 02/13/2017   Procedure: EXTRACORPOREAL SHOCK WAVE LITHOTRIPSY (ESWL);  Surgeon: Royston Cowper, MD;  Location: [facility] ORS;  Service: Urology;  Laterality: Left;  . FOOT FRACTURE SURGERY Right 2018   repair of trigonem of ankle. endoscopic fasciotomy  . KNEE ARTHROSCOPY Right 2013   meniscus  .  KNEE SURGERY Right    knee scope for torn meniscus    MEDICATIONS: . amiodarone (PACERONE) 200 MG tablet  . Cholecalciferol (VITAMIN D3) 50000 units CAPS  . citalopram (CELEXA) 40 MG tablet  . furosemide (LASIX) 20 MG tablet  . HYDROcodone-acetaminophen (NORCO/VICODIN) 5-325 MG tablet  . lisinopril-hydrochlorothiazide (PRINZIDE,ZESTORETIC) 10-12.5 MG tablet  . traMADol (ULTRAM) 50 MG tablet  . XARELTO 20 MG TABS tablet   No current facility-administered medications for this encounter.    - Pt to stop xarelto 3 days prior to surgery   If no changes, I anticipate pt can proceed with surgery as scheduled.   Willeen Cass, FNP-BC [facility]/Anesthesiology Phone: [phone] 08/25/2017  1:50 PM

## 2017-08-29 ENCOUNTER — Ambulatory Visit
Admission: RE | Admit: 2017-08-29 | Discharge: 2017-08-29 | Disposition: A | Discharge status: Home / Self Care | Payer: 59 | Source: Ambulatory Visit | Attending: Surgery | Admitting: Surgery

## 2017-08-29 DIAGNOSIS — Z170 Estrogen receptor positive status [ER+]: Principal | ICD-10-CM

## 2017-08-29 DIAGNOSIS — C50911 Malignant neoplasm of unspecified site of right female breast: Secondary | ICD-10-CM

## 2017-08-29 MED ORDER — CELECOXIB 200 MG PO CAPS
200.0000 mg | ORAL_CAPSULE | ORAL | Status: AC
  Administered 2017-09-01: 200 mg via ORAL
  Filled 2017-08-29: qty 1

## 2017-08-29 MED ORDER — ACETAMINOPHEN 500 MG PO TABS
1000.0000 mg | ORAL_TABLET | ORAL | Status: AC
  Administered 2017-09-01: 1000 mg via ORAL
  Filled 2017-08-29: qty 2

## 2017-08-29 MED ORDER — DEXTROSE 5 % IV SOLN
3.0000 g | INTRAVENOUS | Status: AC
  Administered 2017-09-01: 3 g via INTRAVENOUS
  Filled 2017-08-29: qty 3

## 2017-08-31 NOTE — H&P (Signed)
Heather Conrad  Location: [place] Office Patient #: [id number] DOB: February 19, 1961 Divorced / Language: Cleophus Molt / Race: White Female  History of Present Illness   The patient is a 57 year old female who presents with a complaint of breast cancer.  The PCP is Dr. Lamonte Sakai.  She saw Dr. Earlie Server today for oncology.  She comes with her sister, Auburn Bilberry, and her BIL, Rosana Hoes,  Her last mammogram was about 3 years ago. She felt nothing in her breast before the mammogram. She has a cousin on her mother's side who had breast cancer. Her sister had uterine cancer. She is not on hormonal therapy. 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 Rhona Raider for oncology today. She is going to see a geneticist.  I discussed the options for breast cancer treatment with the patient. I discussed a multidisciplinary approach to the treatment of breast cancer, which includes medical oncology and radiation oncology. She has see Dr. Tasia Catchings. I discussed the surgical options of lumpectomy vs. mastectomy. If mastectomy, there is the possibility of reconstruction. I discussed the options of lymph node biopsy. The treatment plan depends on the pathologic staging of the tumor and the patient's personal wishes. The risks of surgery include, but are not limited to, bleeding, infection, the need for further surgery, and nerve injury. The patient has been given literature on the treatment of breast cancer.  Plan: 1) Results of the Her2Nue test, 2) Genetics consultation - await those results before planning surgery, 3) Radiation oncology consultation, 4) Cardiac clearance and management of xarelto by Dr. Kelle Darting. 5) Right breast lumpectomy (with seed) and right axillary sentinel lymph node biopsy after the genetics are back  Past Medical History: 1. Right  knee  pain - on crutches Seeing Dr. Jarvis Newcomer in [place]. They are trying to get insurance approval for an injectin into her knee. She had right knee surgery about 5 years ago and this is doing well. She her left foot operated on in 2018. This is better. 2. A Fib On Xarelto Sees Dr. Angelica Ran in [place] - she saw Dr. Humphrey Rolls this AM. 3. History of lap chole - 1992 4. History of kidney stones Last about 2 years ago - Dr. Eliberto Ivory 5. She has arthritis from head to toe.  Social History: Divorced. She has a daughter who lives with her - she has twin boys who are 56 yo She has a daughter, Jinny Blossom, who live in [place] on [place]. Ardell Isaacs.! She works with Personal assistant.  She comes with her sister, Auburn Bilberry, and her BIL, Rosana Hoes, She works as the Loss adjuster, chartered for [employer]    Past Surgical History (April Staton, [place]; 07/25/2017 3:51 PM) Foot Surgery  Left. Gallbladder Surgery - Laparoscopic  Knee Surgery  Right.  Diagnostic Studies History (April Staton, [place]; 07/25/2017 3:51 PM) Colonoscopy  never Mammogram  1-3 years ago Pap Smear  1-5 years ago  Social History (April Staton, CMA; 07/25/2017 3:51 PM) Caffeine use  Carbonated beverages, Tea. Illicit drug use  Remotely quit drug use. No alcohol use  Tobacco use  Never smoker.  Family History (April Staton, [place]; 07/25/2017 3:51 PM) Arthritis  Father. Hypertension  Father, Mother. Kidney Disease  Father, Sister.  Pregnancy / Birth History (April Staton, [place]; 07/25/2017 3:51 PM) Age at menarche  13 years. Contraceptive History  Intrauterine device. Gravida  3 Maternal age  19-30 Para  2  Other Problems (April Staton, [place]; 07/25/2017 3:51 PM) Anxiety Disorder  Arthritis  Atrial Fibrillation  Breast Cancer  Cholelithiasis  Kidney Stone     Review of Systems (April Staton CMA; 07/25/2017 3:51 PM) Skin Not Present- Change in Wart/Mole, Dryness, Hives,  Jaundice, New Lesions, Non-Healing Wounds, Rash and Ulcer. HEENT Present- Wears glasses/contact lenses. Not Present- Earache, Hearing Loss, Hoarseness, Nose Bleed, Oral Ulcers, Ringing in the Ears, Seasonal Allergies, Sinus Pain, Sore Throat, Visual Disturbances and Yellow Eyes. Breast Present- Breast Mass. Not Present- Breast Pain, Nipple Discharge and Skin Changes. Cardiovascular Present- Swelling of Extremities. Not Present- Chest Pain, Difficulty Breathing Lying Down, Leg Cramps, Palpitations, Rapid Heart Rate and Shortness of Breath. Female Genitourinary Not Present- Frequency, Nocturia, Painful Urination, Pelvic Pain and Urgency. Musculoskeletal Present- Joint Pain, Joint Stiffness and Swelling of Extremities. Not Present- Back Pain, Muscle Pain and Muscle Weakness. Neurological Present- Trouble walking. Not Present- Decreased Memory, Fainting, Headaches, Numbness, Seizures, Tingling, Tremor and Weakness. Psychiatric Present- Anxiety. Not Present- Bipolar, Change in Sleep Pattern, Depression, Fearful and Frequent crying. Hematology Present- Blood Thinners and Easy Bruising. Not Present- Excessive bleeding, Gland problems, HIV and Persistent Infections.  Physical Exam  General: Obese WFalert. Skin: Inspection and palpation of the skin unremarkable.  Eyes: Conjunctivae white, pupils equal. Face, ears, nose, mouth, and throat: Face - normal. Normal ears and nose. Lips and teeth normal.  Neck: Supple. No mass. Trachea midline. No thyroid mass.  Lymph Nodes: No supraclavicular or cervical adenopathy. No axillary adenopathy.  Lungs: Normal respiratory effort. Clear to auscultation and symmetric breath sounds. Cardiovascular: Regular rate and rythm. Normal auscultation of the heart. No murmur or rub.  Breasts: Right - Bruise in the UOQ of the right breast. I can feel an approx 1.5 cm mass at the 10 o'clock position - ? biopsy hematoma ? Left - No mass or  nodule.  Abdomen: Obese. Soft. No mass. Liver and spleen not palpable. No tenderness. No hernia. Normal bowel sounds.  Rectal: Not done.  Musculoskeletal/extremities: Significant left knee pain with walking. She is using cruthces. She has trouble getting on the exam table.  Neurologic: Grossly intact to motor and sensory function.   Psychiatric: Has normal mood and affect. Judgement and insight appear normal.  Assessment & Plan  1.  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 Tasia Catchings  Plan:   1) Results of the Her2Nue test   Her Her2Neu came back negative.   I spoke to Dr. Tasia Catchings.  2) Genetics consultation   Addendum Note(Javarius Tsosie H. Lucia Gaskins MD; 07/29/2017 8:17 PM)   She did not meet insurance criteria for genetic testing. She did not want to self pay.  3) Radiation oncology consultation   4) Cardiac clearance and management of xarelto by Dr. Kelle Darting.   To hold xarelto   Patient low risk for surgery  5) Right breast lumpectomy (with seed) and right axillary sentinel lymph node biopsy after the genetics are back  2.  ACUTE PAIN OF LEFT KNEE (M25.562)  - on crutches  Seeing Dr. Jarvis Newcomer in [place]. They are trying to get insurance approval for an injectin into her knee.  She had right knee surgery about 5 years ago and this  is doing well.  She her left foot operated on in 2018. This is better. 3.  ANTICOAGULATED (Z79.01)  Impression: on Xarelto  4. A Fib  On Xarelto  Sees Dr. Angelica Ran in [place] - she saw Dr. Humphrey Rolls this AM. 5. History of kidney stones   Last about 2 years ago - Dr. Eliberto Ivory 6. She has arthritis from head to toe.   Alphonsa Overall, MD, [facility] Pager: [phone] Office phone:  [phone]

## 2017-09-01 ENCOUNTER — Ambulatory Visit (HOSPITAL_COMMUNITY)
Admission: RE | Admit: 2017-09-01 | Discharge: 2017-09-01 | Disposition: A | Discharge status: Home / Self Care | Payer: 59 | Source: Ambulatory Visit | Attending: Surgery | Admitting: Surgery

## 2017-09-01 ENCOUNTER — Ambulatory Visit
Admission: RE | Admit: 2017-09-01 | Discharge: 2017-09-01 | Disposition: A | Discharge status: Home / Self Care | Payer: 59 | Source: Ambulatory Visit | Attending: Surgery | Admitting: Surgery

## 2017-09-01 ENCOUNTER — Ambulatory Visit (HOSPITAL_COMMUNITY): Payer: 59 | Admitting: Emergency Medicine

## 2017-09-01 ENCOUNTER — Encounter (HOSPITAL_COMMUNITY)
Admission: RE | Disposition: A | Discharge status: Home / Self Care | Payer: Self-pay | Source: Ambulatory Visit | Attending: Surgery

## 2017-09-01 ENCOUNTER — Ambulatory Visit (HOSPITAL_COMMUNITY): Payer: 59 | Admitting: Anesthesiology

## 2017-09-01 ENCOUNTER — Encounter (HOSPITAL_COMMUNITY): Payer: Self-pay | Admitting: *Deleted

## 2017-09-01 DIAGNOSIS — Z7901 Long term (current) use of anticoagulants: Secondary | ICD-10-CM | POA: Insufficient documentation

## 2017-09-01 DIAGNOSIS — Z803 Family history of malignant neoplasm of breast: Secondary | ICD-10-CM | POA: Insufficient documentation

## 2017-09-01 DIAGNOSIS — Z170 Estrogen receptor positive status [ER+]: Secondary | ICD-10-CM | POA: Insufficient documentation

## 2017-09-01 DIAGNOSIS — C50411 Malignant neoplasm of upper-outer quadrant of right female breast: Secondary | ICD-10-CM | POA: Diagnosis present

## 2017-09-01 DIAGNOSIS — I4891 Unspecified atrial fibrillation: Secondary | ICD-10-CM | POA: Diagnosis not present

## 2017-09-01 DIAGNOSIS — C50911 Malignant neoplasm of unspecified site of right female breast: Secondary | ICD-10-CM

## 2017-09-01 HISTORY — PX: BREAST LUMPECTOMY WITH RADIOACTIVE SEED AND SENTINEL LYMPH NODE BIOPSY: SHX6550

## 2017-09-01 HISTORY — PX: BREAST LUMPECTOMY: SHX2

## 2017-09-01 LAB — HCG, SERUM, QUALITATIVE: Preg, Serum: NEGATIVE

## 2017-09-01 SURGERY — BREAST LUMPECTOMY WITH RADIOACTIVE SEED AND SENTINEL LYMPH NODE BIOPSY
Anesthesia: General | Site: Breast | Laterality: Right

## 2017-09-01 MED ORDER — BUPIVACAINE-EPINEPHRINE (PF) 0.25% -1:200000 IJ SOLN
INTRAMUSCULAR | Status: AC
  Filled 2017-09-01: qty 30

## 2017-09-01 MED ORDER — FENTANYL CITRATE (PF) 100 MCG/2ML IJ SOLN
INTRAMUSCULAR | Status: AC
  Filled 2017-09-01: qty 2

## 2017-09-01 MED ORDER — 0.9 % SODIUM CHLORIDE (POUR BTL) OPTIME
TOPICAL | Status: DC | PRN
  Administered 2017-09-01: 1000 mL

## 2017-09-01 MED ORDER — SUCCINYLCHOLINE CHLORIDE 200 MG/10ML IV SOSY
PREFILLED_SYRINGE | INTRAVENOUS | Status: AC
  Filled 2017-09-01: qty 10

## 2017-09-01 MED ORDER — OXYCODONE HCL 5 MG PO TABS
5.0000 mg | ORAL_TABLET | Freq: Once | ORAL | Status: AC | PRN
  Administered 2017-09-01: 5 mg via ORAL

## 2017-09-01 MED ORDER — HYDROCODONE-ACETAMINOPHEN 5-325 MG PO TABS: 1.0000 | ORAL_TABLET | Freq: Four times a day (QID) | ORAL | 0 refills | Status: DC | PRN

## 2017-09-01 MED ORDER — LIDOCAINE HCL (CARDIAC) PF 100 MG/5ML IV SOSY
PREFILLED_SYRINGE | INTRAVENOUS | Status: DC | PRN
  Administered 2017-09-01: 80 mg via INTRAVENOUS

## 2017-09-01 MED ORDER — FENTANYL CITRATE (PF) 100 MCG/2ML IJ SOLN
25.0000 ug | INTRAMUSCULAR | Status: DC | PRN
  Administered 2017-09-01: 50 ug via INTRAVENOUS
  Administered 2017-09-01: 25 ug via INTRAVENOUS

## 2017-09-01 MED ORDER — OXYCODONE HCL 5 MG PO TABS
ORAL_TABLET | ORAL | Status: DC
Start: 2017-09-01 — End: 2017-09-01
  Filled 2017-09-01: qty 1

## 2017-09-01 MED ORDER — MIDAZOLAM HCL 2 MG/2ML IJ SOLN
INTRAMUSCULAR | Status: AC
  Filled 2017-09-01: qty 2

## 2017-09-01 MED ORDER — PROPOFOL 10 MG/ML IV BOLUS
INTRAVENOUS | Status: DC | PRN
  Administered 2017-09-01: 150 mg via INTRAVENOUS

## 2017-09-01 MED ORDER — BUPIVACAINE-EPINEPHRINE 0.25% -1:200000 IJ SOLN
INTRAMUSCULAR | Status: DC | PRN
  Administered 2017-09-01: 15 mL

## 2017-09-01 MED ORDER — KETOROLAC TROMETHAMINE 30 MG/ML IJ SOLN
INTRAMUSCULAR | Status: AC
  Filled 2017-09-01: qty 1

## 2017-09-01 MED ORDER — DEXAMETHASONE SODIUM PHOSPHATE 10 MG/ML IJ SOLN
INTRAMUSCULAR | Status: DC | PRN
  Administered 2017-09-01: 10 mg via INTRAVENOUS

## 2017-09-01 MED ORDER — FENTANYL CITRATE (PF) 250 MCG/5ML IJ SOLN
INTRAMUSCULAR | Status: AC
Start: 2017-09-01 — End: ?
  Filled 2017-09-01: qty 5

## 2017-09-01 MED ORDER — ONDANSETRON HCL 4 MG/2ML IJ SOLN
INTRAMUSCULAR | Status: DC | PRN
  Administered 2017-09-01: 4 mg via INTRAVENOUS

## 2017-09-01 MED ORDER — METHYLENE BLUE 0.5 % INJ SOLN
INTRAVENOUS | Status: AC
  Filled 2017-09-01: qty 10

## 2017-09-01 MED ORDER — FENTANYL CITRATE (PF) 100 MCG/2ML IJ SOLN
INTRAMUSCULAR | Status: DC | PRN
  Administered 2017-09-01: 50 ug via INTRAVENOUS
  Administered 2017-09-01 (×3): 25 ug via INTRAVENOUS
  Administered 2017-09-01: 50 ug via INTRAVENOUS
  Administered 2017-09-01: 25 ug via INTRAVENOUS

## 2017-09-01 MED ORDER — ONDANSETRON HCL 4 MG/2ML IJ SOLN: 4.0000 mg | Freq: Once | INTRAMUSCULAR | Status: DC | PRN

## 2017-09-01 MED ORDER — TECHNETIUM TC 99M SULFUR COLLOID FILTERED
1.0000 | Freq: Once | INTRAVENOUS | Status: AC | PRN
  Administered 2017-09-01: 1 via INTRADERMAL

## 2017-09-01 MED ORDER — DEXAMETHASONE SODIUM PHOSPHATE 10 MG/ML IJ SOLN
INTRAMUSCULAR | Status: AC
  Filled 2017-09-01: qty 1

## 2017-09-01 MED ORDER — KETOROLAC TROMETHAMINE 30 MG/ML IJ SOLN
INTRAMUSCULAR | Status: DC | PRN
  Administered 2017-09-01: 30 mg via INTRAVENOUS

## 2017-09-01 MED ORDER — SODIUM CHLORIDE 0.9 % IJ SOLN
INTRAMUSCULAR | Status: AC
  Filled 2017-09-01: qty 10

## 2017-09-01 MED ORDER — MIDAZOLAM HCL 2 MG/2ML IJ SOLN
INTRAMUSCULAR | Status: DC | PRN
  Administered 2017-09-01 (×2): 1 mg via INTRAVENOUS

## 2017-09-01 MED ORDER — LACTATED RINGERS IV SOLN
INTRAVENOUS | Status: DC
  Administered 2017-09-01 (×2): via INTRAVENOUS

## 2017-09-01 MED ORDER — PROPOFOL 10 MG/ML IV BOLUS
INTRAVENOUS | Status: AC
  Filled 2017-09-01: qty 40

## 2017-09-01 MED ORDER — OXYCODONE HCL 5 MG/5ML PO SOLN: 5.0000 mg | Freq: Once | ORAL | Status: AC | PRN

## 2017-09-01 MED ORDER — ARTIFICIAL TEARS OPHTHALMIC OINT
TOPICAL_OINTMENT | OPHTHALMIC | Status: AC
  Filled 2017-09-01: qty 3.5

## 2017-09-01 MED ORDER — EPHEDRINE SULFATE 50 MG/ML IJ SOLN
INTRAMUSCULAR | Status: DC | PRN
  Administered 2017-09-01 (×4): 10 mg via INTRAVENOUS

## 2017-09-01 MED ORDER — SODIUM CHLORIDE 0.9 % IJ SOLN
INTRAVENOUS | Status: DC | PRN
  Administered 2017-09-01: 1 mL via INTRAMUSCULAR

## 2017-09-01 MED ORDER — SUCCINYLCHOLINE CHLORIDE 20 MG/ML IJ SOLN
INTRAMUSCULAR | Status: DC | PRN
  Administered 2017-09-01: 120 mg via INTRAVENOUS

## 2017-09-01 MED ORDER — ONDANSETRON HCL 4 MG/2ML IJ SOLN
INTRAMUSCULAR | Status: AC
  Filled 2017-09-01: qty 2

## 2017-09-01 SURGICAL SUPPLY — 49 items
ATCH SMKEVC FLXB CAUT HNDSWH (FILTER) ×1 IMPLANT
BINDER BREAST LRG (GAUZE/BANDAGES/DRESSINGS) IMPLANT
BINDER BREAST XLRG (GAUZE/BANDAGES/DRESSINGS) IMPLANT
BINDER BREAST XXLRG (GAUZE/BANDAGES/DRESSINGS) ×3 IMPLANT
BLADE SURG 15 STRL LF DISP TIS (BLADE) ×2 IMPLANT
BLADE SURG 15 STRL SS (BLADE) ×4
CANISTER SUCT 3000ML PPV (MISCELLANEOUS) ×3 IMPLANT
CHLORAPREP W/TINT 26ML (MISCELLANEOUS) ×3 IMPLANT
CLIP VESOCCLUDE SM WIDE 6/CT (CLIP) ×3 IMPLANT
COVER PROBE W GEL 5X96 (DRAPES) ×3 IMPLANT
COVER SURGICAL LIGHT HANDLE (MISCELLANEOUS) ×3 IMPLANT
DERMABOND ADVANCED (GAUZE/BANDAGES/DRESSINGS) ×2
DERMABOND ADVANCED .7 DNX12 (GAUZE/BANDAGES/DRESSINGS) ×1 IMPLANT
DEVICE DUBIN SPECIMEN MAMMOGRA (MISCELLANEOUS) ×3 IMPLANT
DRAPE CHEST BREAST 15X10 FENES (DRAPES) ×3 IMPLANT
DRAPE UTILITY XL STRL (DRAPES) ×3 IMPLANT
ELECT CAUTERY BLADE 6.4 (BLADE) ×3 IMPLANT
ELECT COATED BLADE 2.86 ST (ELECTRODE) ×3 IMPLANT
ELECT REM PT RETURN 9FT ADLT (ELECTROSURGICAL) ×3
ELECTRODE REM PT RTRN 9FT ADLT (ELECTROSURGICAL) ×1 IMPLANT
EVACUATOR SMOKE ACCUVAC VALLEY (FILTER) ×2
GAUZE SPONGE 4X4 12PLY STRL (GAUZE/BANDAGES/DRESSINGS) ×3 IMPLANT
GLOVE SURG SIGNA 7.5 PF LTX (GLOVE) ×6 IMPLANT
GOWN STRL REUS W/ TWL LRG LVL3 (GOWN DISPOSABLE) ×1 IMPLANT
GOWN STRL REUS W/ TWL XL LVL3 (GOWN DISPOSABLE) ×1 IMPLANT
GOWN STRL REUS W/TWL LRG LVL3 (GOWN DISPOSABLE) ×2
GOWN STRL REUS W/TWL XL LVL3 (GOWN DISPOSABLE) ×2
ILLUMINATOR WAVEGUIDE N/F (MISCELLANEOUS) IMPLANT
KIT BASIN OR (CUSTOM PROCEDURE TRAY) ×3 IMPLANT
KIT MARKER MARGIN INK (KITS) ×3 IMPLANT
LIGHT WAVEGUIDE WIDE FLAT (MISCELLANEOUS) IMPLANT
NDL SAFETY ECLIPSE 18X1.5 (NEEDLE) ×1 IMPLANT
NEEDLE FILTER BLUNT 18X 1/2SAF (NEEDLE) ×2
NEEDLE FILTER BLUNT 18X1 1/2 (NEEDLE) ×1 IMPLANT
NEEDLE HYPO 18GX1.5 SHARP (NEEDLE) ×2
NEEDLE HYPO 25GX1X1/2 BEV (NEEDLE) ×6 IMPLANT
NS IRRIG 1000ML POUR BTL (IV SOLUTION) ×3 IMPLANT
PACK SURGICAL SETUP 50X90 (CUSTOM PROCEDURE TRAY) ×3 IMPLANT
PENCIL BUTTON HOLSTER BLD 10FT (ELECTRODE) ×3 IMPLANT
SPONGE LAP 18X18 X RAY DECT (DISPOSABLE) ×3 IMPLANT
SUT MNCRL AB 4-0 PS2 18 (SUTURE) ×3 IMPLANT
SUT VIC AB 3-0 SH 8-18 (SUTURE) ×6 IMPLANT
SYR BULB 3OZ (MISCELLANEOUS) ×3 IMPLANT
SYR CONTROL 10ML LL (SYRINGE) ×3 IMPLANT
TOWEL OR 17X24 6PK STRL BLUE (TOWEL DISPOSABLE) ×3 IMPLANT
TOWEL OR 17X26 10 PK STRL BLUE (TOWEL DISPOSABLE) ×3 IMPLANT
TUBE CONNECTING 12'X1/4 (SUCTIONS) ×2
TUBE CONNECTING 12X1/4 (SUCTIONS) ×4 IMPLANT
YANKAUER SUCT BULB TIP NO VENT (SUCTIONS) ×3 IMPLANT

## 2017-09-01 NOTE — Discharge Instructions (Signed)
[facility] - DISCHARGE INSTRUCTIONS TO PATIENT  Activity:  Driving - May drive in 2 or 3 days, if doing well   Lifting - No lifting more than 15 pounds for one week, then no limit  Wound Care:   Leave bandage for 2 days, then you may removed the bandage and shower  Diet:  As tolerated  Follow up appointment:  Call Dr. [name] office [facility]) at [phone] for an appointment in 2 - 3 weeks  Medications and dosages:  Resume your home medications.  You have a prescription for:  Vicodin        Resume Xarelto in 2 days, if minimal bruising.  If there is a question, call our office.  Call Dr. Lucia Gaskins or his office  [phone]) if you have:  Temperature greater than 100.4,  Persistent nausea and vomiting,  Severe uncontrolled pain,  Redness, tenderness, or signs of infection (pain, swelling, redness, odor or green/yellow discharge around the site),  Difficulty breathing, headache or visual disturbances,  Any other questions or concerns you may have after discharge.  In an emergency, call 911 or go to an Emergency Department at a nearby hospital.

## 2017-09-01 NOTE — Transfer of Care (Signed)
Immediate Anesthesia Transfer of Care Note  Patient: Heather Conrad  Procedure(s) Performed: BREAST LUMPECTOMY WITH RADIOACTIVE SEED AND SENTINEL LYMPH NODE BIOPSY (Right Breast)  Patient Location: PACU  Anesthesia Type:General  Level of Consciousness: awake, alert  and oriented  Airway & Oxygen Therapy: Patient Spontanous Breathing and Patient connected to face mask oxygen  Post-op Assessment: Report given to RN and Patient moving all extremities X 4  Post vital signs: Reviewed and stable  Last Vitals:  Vitals Value Taken Time  BP 98/72 09/01/2017  8:57 AM  Temp    Pulse 78 09/01/2017  8:59 AM  Resp 27 09/01/2017  8:59 AM  SpO2 95 % 09/01/2017  8:59 AM  Vitals shown include unvalidated device data.  Last Pain:  Vitals:   09/01/17 0627  TempSrc: Oral  PainSc:          Complications: No apparent anesthesia complications

## 2017-09-01 NOTE — Anesthesia Procedure Notes (Signed)
Anesthesia Regional Block: Pectoralis block   Pre-Anesthetic Checklist: ,, timeout performed, Correct Patient, Correct Site, Correct Laterality, Correct Procedure, Correct Position, site marked, Risks and benefits discussed,  Surgical consent,  Pre-op evaluation,  At surgeon's request and post-op pain management  Laterality: Right  Prep: chloraprep       Needles:  Injection technique: Single-shot  Needle Type: Echogenic Needle     Needle Length: 9cm  Needle Gauge: 21     Additional Needles:   Procedures:,,,, ultrasound used (permanent image in chart),,,,  Narrative:  Start time: 09/01/2017 6:50 AM End time: 09/01/2017 7:00 AM Injection made incrementally with aspirations every 5 mL.  Performed by: Personally  Anesthesiologist: Roberts Gaudy, MD  Additional Notes: 20 cc 0.75% Ropivacaine with 0.3 mg clonidine injected easily

## 2017-09-01 NOTE — Anesthesia Procedure Notes (Addendum)
Procedure Name: Intubation Date/Time: 09/01/2017 7:25 AM Performed by: Julieta Bellini, CRNA Pre-anesthesia Checklist: Patient identified, Emergency Drugs available, Suction available, Patient being monitored and Timeout performed Patient Re-evaluated:Patient Re-evaluated prior to induction Oxygen Delivery Method: Circle system utilized Preoxygenation: Pre-oxygenation with 100% oxygen Induction Type: IV induction Ventilation: Mask ventilation without difficulty Laryngoscope Size: Glidescope and 4 Grade View: Grade I Tube type: Oral Tube size: 7.0 mm Number of attempts: 1 Airway Equipment and Method: Stylet and Video-laryngoscopy Placement Confirmation: ETT inserted through vocal cords under direct vision,  positive ETCO2 and breath sounds checked- equal and bilateral Secured at: 22 cm Tube secured with: Tape Dental Injury: Teeth and Oropharynx as per pre-operative assessment

## 2017-09-01 NOTE — Interval H&P Note (Signed)
History and Physical Interval Note:  09/01/2017 7:13 AM  Heather Conrad  has presented today for surgery, with the diagnosis of RIGHT BREAST CANCER  The various methods of treatment have been discussed with the patient and family.   Daughter at bedside.  Seed in place.  After consideration of risks, benefits and other options for treatment, the patient has consented to  Procedure(s): BREAST LUMPECTOMY WITH RADIOACTIVE SEED AND SENTINEL LYMPH NODE BIOPSY (Right) as a surgical intervention .  The patient's history has been reviewed, patient examined, no change in status, stable for surgery.  I have reviewed the patient's chart and labs.  Questions were answered to the patient's satisfaction.     Shann Medal

## 2017-09-01 NOTE — Anesthesia Postprocedure Evaluation (Signed)
Anesthesia Post Note  Patient: Heather Conrad  Procedure(s) Performed: BREAST LUMPECTOMY WITH RADIOACTIVE SEED AND SENTINEL LYMPH NODE BIOPSY (Right Breast)     Patient location during evaluation: PACU Anesthesia Type: General Level of consciousness: awake and alert Pain management: pain level controlled Vital Signs Assessment: post-procedure vital signs reviewed and stable Respiratory status: spontaneous breathing, nonlabored ventilation, respiratory function stable and patient connected to nasal cannula oxygen Cardiovascular status: blood pressure returned to baseline and stable Postop Assessment: no apparent nausea or vomiting Anesthetic complications: no    Last Vitals:  Vitals:   09/01/17 0924 09/01/17 0945  BP: (!) 123/53 (!) 112/59  Pulse:  72  Resp:  17  Temp:    SpO2:  99%    Last Pain:  Vitals:   09/01/17 0924  TempSrc:   PainSc: 5                  Ceara Wrightson COKER

## 2017-09-01 NOTE — Op Note (Signed)
09/01/2017  8:55 AM  PATIENT:  Heather Conrad DOB: [birth year] MRN: [id number]  PREOP DIAGNOSIS:   RIGHT BREAST CANCER  POSTOP DIAGNOSIS:    Right breast cancer, 10 o'clock position (T1, N0)  PROCEDURE:   Procedure(s): Right BREAST LUMPECTOMY WITH RADIOACTIVE SEED AND right axillary SENTINEL LYMPH NODE BIOPSY, Injection of peri areolar area of breast with methylene blue (1.0 cc), deep sentinel lymph node biopsy  SURGEON:   Alphonsa Overall, M.D.  ANESTHESIA:   general  Anesthesiologist: Roberts Gaudy, MD CRNA: Shirlyn Goltz, CRNA; Julieta Bellini, CRNA Student Nurse Anesthetist: Wess Botts, RN  General  EBL:  minimal  ml  DRAINS:  none   LOCAL MEDICATIONS USED:   15 cc of 1/4% marcaine, right pectoral block by anesthsia  SPECIMEN:   Right breast lumpectomy (painted with 6 colors), right axillary sentinel lymph node (counts - 140, background - 0, blue)  COUNTS CORRECT:  YES  INDICATIONS FOR PROCEDURE:  Heather Conrad is a 57 y.o. (DOB: 1960-06-07) white female whose primary care physician is Heather Maltese, MD and comes for right breast lumpectomy and right axillary sentinel lymph node biopsy.   The options for breast cancer treatment have been discussed with the patient. She has seen Dr. Earlie Conrad for oncology.  She elected to proceed with lumpectomy and axillary sentinel lymph node.     The indications and potential complications of surgery were explained to the patient. Potential complications include, but are not limited to, bleeding, infection, the need for further surgery, and nerve injury.     She had a I131 seed placed on 08/29/2017 in her right breast at [facility].  The seed is in the 10 o'clock position of the right breast.   In the holding area, her right areola was injected with 1 millicurie of Technitium Sulfur Colloid.  OPERATIVE NOTE:   The patient was taken to operating room # 8 at [facility] where she underwent a general anesthesia  supervised by  Anesthesiologist: Roberts Gaudy, MD CRNA: Shirlyn Goltz, CRNA; Julieta Bellini, CRNA Student Nurse Anesthetist: Wess Botts, RN. Her right breast and axilla were prepped with  ChloraPrep and sterilely draped.    A time-out and the surgical check list was reviewed.    I injected about 1.0 mL of 40% methylene blue around her right areola.   I turned attention to the cancer which was about at the 10 o'clock position of the right breast.  I made an incision about 6 cm off the areola, over the seed.   I used the Neoprobe to identify the I131 seed.  I tried to excise an area around the tumor of at least 1 cm.    I excised this block of breast tissue approximately 4 cm by 4 cm  in diameter.   I painted the lumpectomy specimen with the 6 color paint kit and did a specimen mammogram which confirmed the mass, clip, and the seed were all in the right position in the specimen.  The specimen was sent to pathology who called back to confirm that they have the seed and the specimen.   I then started the right deep axillary sentinel lymph node biopsy. I made an incision in the right axilla.  I found a hot area at the junction of the breast and the pectoralis major muscle, deep in the axilla. I cut down and  identified a hot node that had counts of 140 and the background has 0 counts.  The lymph node was blue. I checked her internal mammary nodes and supraclavicular nodes with the neoprobe and found no other hot area. The axillary node was then sent to pathology.    I then irrigated the wound with saline. I infiltrated approximately 15 mL of 1/4% Marcaine between the incisions. I placed 4 clips to mark biopsy cavity, at 12, 3, 6, and 9 o'clock.  I then closed all the wounds in layers using 3-0 Vicryl sutures for the deep layer. At the skin, I closed the incisions with a 4-0 Monocryl suture. The incisions were then painted with Dermabond.  She had gauze place over the wounds and placed in a breast  binder.   The patient tolerated the procedure well, was transported to the recovery room in good condition. Sponge and needle count were correct at the end of the case.   Final pathology is pending.   Alphonsa Overall, MD, [facility] Pager: [phone] Office phone:  [phone]

## 2017-09-01 NOTE — Anesthesia Preprocedure Evaluation (Addendum)
Anesthesia Evaluation  Patient identified by MRN, date of birth, ID band Patient awake    Reviewed: Allergy & Precautions, NPO status , Patient's Chart, lab work & pertinent test results  Airway Mallampati: III  TM Distance: >3 FB     Dental  (+) Edentulous Upper, Edentulous Lower   Pulmonary    breath sounds clear to auscultation       Cardiovascular  Rhythm:Regular Rate:Normal     Neuro/Psych    GI/Hepatic   Endo/Other    Renal/GU      Musculoskeletal   Abdominal (+) + obese,   Peds  Hematology   Anesthesia Other Findings   Reproductive/Obstetrics                            Anesthesia Physical Anesthesia Plan  ASA: III  Anesthesia Plan: General   Post-op Pain Management:    Induction: Intravenous  PONV Risk Score and Plan: Ondansetron and Dexamethasone  Airway Management Planned: Oral ETT  Additional Equipment:   Intra-op Plan:   Post-operative Plan: Extubation in OR  Informed Consent: I have reviewed the patients History and Physical, chart, labs and discussed the procedure including the risks, benefits and alternatives for the proposed anesthesia with the patient or authorized representative who has indicated his/her understanding and acceptance.     Plan Discussed with: Anesthesiologist  Anesthesia Plan Comments:         Anesthesia Quick Evaluation

## 2017-09-02 ENCOUNTER — Encounter (HOSPITAL_COMMUNITY): Payer: Self-pay | Admitting: Surgery

## 2017-09-03 ENCOUNTER — Telehealth: Payer: Self-pay | Admitting: *Deleted

## 2017-09-03 NOTE — Telephone Encounter (Signed)
Lab/MD week of 09/22/17 per Almyra Free 09/03/17 scheduler message Patient is scheduled as requested. She prefers to come on that Monday. She was made aware of the time of her lab/MD appt.

## 2017-09-08 ENCOUNTER — Other Ambulatory Visit: Payer: Self-pay | Admitting: Neurological Surgery

## 2017-09-08 DIAGNOSIS — R258 Other abnormal involuntary movements: Secondary | ICD-10-CM

## 2017-09-08 DIAGNOSIS — M48061 Spinal stenosis, lumbar region without neurogenic claudication: Secondary | ICD-10-CM

## 2017-09-13 ENCOUNTER — Ambulatory Visit
Admission: RE | Admit: 2017-09-13 | Discharge: 2017-09-13 | Disposition: A | Discharge status: Home / Self Care | Payer: 59 | Source: Ambulatory Visit | Attending: Neurological Surgery | Admitting: Neurological Surgery

## 2017-09-13 DIAGNOSIS — M48061 Spinal stenosis, lumbar region without neurogenic claudication: Secondary | ICD-10-CM

## 2017-09-13 DIAGNOSIS — R258 Other abnormal involuntary movements: Secondary | ICD-10-CM

## 2017-09-17 ENCOUNTER — Encounter (HOSPITAL_COMMUNITY): Payer: Self-pay | Admitting: Oncology

## 2017-09-17 ENCOUNTER — Other Ambulatory Visit: Payer: Self-pay | Admitting: Oncology

## 2017-09-17 DIAGNOSIS — Z170 Estrogen receptor positive status [ER+]: Principal | ICD-10-CM

## 2017-09-17 DIAGNOSIS — C50411 Malignant neoplasm of upper-outer quadrant of right female breast: Secondary | ICD-10-CM

## 2017-09-22 ENCOUNTER — Inpatient Hospital Stay (HOSPITAL_BASED_OUTPATIENT_CLINIC_OR_DEPARTMENT_OTHER): Payer: 59 | Admitting: Oncology

## 2017-09-22 ENCOUNTER — Inpatient Hospital Stay: Payer: 59 | Attending: Oncology

## 2017-09-22 ENCOUNTER — Encounter: Payer: Self-pay | Admitting: Oncology

## 2017-09-22 VITALS — BP 168/99 | HR 58 | Temp 96.1°F | Resp 21 | Wt 303.1 lb

## 2017-09-22 DIAGNOSIS — I4891 Unspecified atrial fibrillation: Secondary | ICD-10-CM | POA: Insufficient documentation

## 2017-09-22 DIAGNOSIS — Z79899 Other long term (current) drug therapy: Secondary | ICD-10-CM | POA: Diagnosis not present

## 2017-09-22 DIAGNOSIS — Z803 Family history of malignant neoplasm of breast: Secondary | ICD-10-CM | POA: Diagnosis not present

## 2017-09-22 DIAGNOSIS — Z170 Estrogen receptor positive status [ER+]: Secondary | ICD-10-CM

## 2017-09-22 DIAGNOSIS — Z800 Family history of malignant neoplasm of digestive organs: Secondary | ICD-10-CM | POA: Insufficient documentation

## 2017-09-22 DIAGNOSIS — Z7901 Long term (current) use of anticoagulants: Secondary | ICD-10-CM | POA: Diagnosis not present

## 2017-09-22 DIAGNOSIS — C50411 Malignant neoplasm of upper-outer quadrant of right female breast: Secondary | ICD-10-CM

## 2017-09-22 DIAGNOSIS — E669 Obesity, unspecified: Secondary | ICD-10-CM | POA: Diagnosis not present

## 2017-09-22 LAB — CBC WITH DIFFERENTIAL/PLATELET
Basophils Absolute: 0 10*3/uL (ref 0–0.1)
Basophils Relative: 1 %
EOS ABS: 0.4 10*3/uL (ref 0–0.7)
EOS PCT: 4 %
HCT: 36 % (ref 35.0–47.0)
Hemoglobin: 12.4 g/dL (ref 12.0–16.0)
Lymphocytes Relative: 24 %
Lymphs Abs: 2 10*3/uL (ref 1.0–3.6)
MCH: 30.8 pg (ref 26.0–34.0)
MCHC: 34.5 g/dL (ref 32.0–36.0)
MCV: 89.4 fL (ref 80.0–100.0)
Monocytes Absolute: 0.6 10*3/uL (ref 0.2–0.9)
Monocytes Relative: 7 %
Neutro Abs: 5.4 10*3/uL (ref 1.4–6.5)
Neutrophils Relative %: 64 %
PLATELETS: 235 10*3/uL (ref 150–440)
RBC: 4.03 MIL/uL (ref 3.80–5.20)
RDW: 14.9 % — ABNORMAL HIGH (ref 11.5–14.5)
WBC: 8.4 10*3/uL (ref 3.6–11.0)

## 2017-09-22 LAB — COMPREHENSIVE METABOLIC PANEL
ALBUMIN: 3.5 g/dL (ref 3.5–5.0)
ALT: 17 U/L (ref 0–44)
AST: 17 U/L (ref 15–41)
Alkaline Phosphatase: 74 U/L (ref 38–126)
Anion gap: 8 (ref 5–15)
BUN: 28 mg/dL — AB (ref 6–20)
CO2: 26 mmol/L (ref 22–32)
CREATININE: 1.28 mg/dL — AB (ref 0.44–1.00)
Calcium: 9.5 mg/dL (ref 8.9–10.3)
Chloride: 105 mmol/L (ref 98–111)
GFR calc Af Amer: 54 mL/min — ABNORMAL LOW (ref >60)
GFR, EST NON AFRICAN AMERICAN: 46 mL/min — AB (ref >60)
GLUCOSE: 106 mg/dL — AB (ref 70–99)
POTASSIUM: 4.6 mmol/L (ref 3.5–5.1)
Sodium: 139 mmol/L (ref 135–145)
Total Bilirubin: 0.6 mg/dL (ref 0.3–1.2)
Total Protein: 6.9 g/dL (ref 6.5–8.1)

## 2017-09-22 NOTE — Progress Notes (Signed)
Pt in for follow up after breast surgery on 09/01/17.  Denies any concerns today.

## 2017-09-22 NOTE — Progress Notes (Signed)
Hematology/Oncology follow up note Heather Conrad, Heather Conrad Telephone:([phone] Fax:([phone]) [phone]   Patient Care Team: Perrin Maltese, MD as PCP - General (Internal Medicine) Dionisio David, MD as Consulting Physician (Cardiology) Earlie Server, MD as Consulting Physician (Oncology) Elsie Saas, MD as Consulting Physician (Orthopedic Surgery)  REFERRING PROVIDER: Perrin Maltese, MD CHIEF COMPLAINTS/PURPOSE OF CONSULTATION:  Evaluation of breast cancer  HISTORY OF PRESENTING ILLNESS:  Heather Conrad is a  57 y.o.  female with PMH listed below who was referred to me for evaluation of breast cancer.  Recent screening Mammogram showed 1. Complex mass in the right breast, predominantly cystic, with abnormal tissue along its anterior wall. Biopsy is recommended. 2. Benign left breast calcifications.Sonographic evaluation of the right axilla shows no enlarged or abnormal lymph nodes. 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  She denies any breast pain, nipple discharge or breast skin changes.   INTERVAL HISTORY Heather Conrad is a 57 y.o. female who has above history reviewed by me today presents for follow up visit for management of breast cancer.  During the interval, she is s/p right lumpectomy and sentinel lymph node biopsy. 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   Patient reports feeling well today. Denies any wound discharge or pain.    Review of Systems  Constitutional: Negative for chills, fever, malaise/fatigue and weight loss.  HENT: Negative for congestion, ear discharge, ear pain, nosebleeds, sinus pain and sore throat.   Eyes: Negative for double vision, photophobia, pain, discharge and redness.  Respiratory: Negative for cough, hemoptysis, sputum production, shortness of breath and wheezing.     Cardiovascular: Negative for chest pain, palpitations, orthopnea, claudication and leg swelling.  Gastrointestinal: Negative for abdominal pain, blood in stool, constipation, diarrhea, heartburn, melena, nausea and vomiting.  Genitourinary: Negative for dysuria, flank pain, frequency and hematuria.  Musculoskeletal: Negative for back pain, myalgias and neck pain.  Skin: Negative for itching and rash.  Neurological: Negative for dizziness, tingling, tremors, focal weakness, weakness and headaches.  Endo/Heme/Allergies: Negative for environmental allergies. Does not bruise/bleed easily.  Psychiatric/Behavioral: Negative for depression and hallucinations. The patient is not nervous/anxious.     MEDICAL HISTORY:  Past Medical History:  Diagnosis Date  . Acute renal failure (ARF) ([place]) 01/2012  . Anxiety   . Arthritis   . Dyspnea   . Dysrhythmia 01/2017   atrial fibrillation  . Heart murmur    per pt report  . History of MRSA infection 2013   leg, abdomen, arm  . Hyperlipidemia 2018  . Kidney stone 01/2017  . Obesity 01/2017  . Pyelonephritis   . UTI (urinary tract infection)     SURGICAL HISTORY: Past Surgical History:  Procedure Laterality Date  . BREAST BIOPSY Right 07/16/2017   pending path  . BREAST LUMPECTOMY WITH RADIOACTIVE SEED AND SENTINEL LYMPH NODE BIOPSY Right 09/01/2017   Procedure: BREAST LUMPECTOMY WITH RADIOACTIVE SEED AND SENTINEL LYMPH NODE BIOPSY;  Surgeon: Alphonsa Overall, MD;  Location: [facility];  Service: General;  Laterality: Right;  . CHOLECYSTECTOMY     1992  . EXTRACORPOREAL SHOCK WAVE LITHOTRIPSY Left 02/13/2017   Procedure: EXTRACORPOREAL SHOCK WAVE LITHOTRIPSY (ESWL);  Surgeon: Royston Cowper, MD;  Location: [facility];  Service: Urology;  Laterality: Left;  . FOOT FRACTURE SURGERY Right 2018   repair of trigonem of ankle. endoscopic fasciotomy  . KNEE  ARTHROSCOPY Right 2013   meniscus  . KNEE SURGERY Right    knee scope for torn meniscus     SOCIAL HISTORY: Social History   Socioeconomic History  . Marital status: Divorced    Spouse name: Not on file  . Number of children: 2  . Years of education: Not on file  . Highest education level: Not on file  Occupational History  . Not on file  Social Needs  . Financial resource strain: Not on file  . Food insecurity:    Worry: Not on file    Inability: Not on file  . Transportation needs:    Medical: Not on file    Non-medical: Not on file  Tobacco Use  . Smoking status: Never Smoker  . Smokeless tobacco: Never Used  Substance and Sexual Activity  . Alcohol use: No  . Drug use: No  . Sexual activity: Not on file  Lifestyle  . Physical activity:    Days per week: Not on file    Minutes per session: Not on file  . Stress: Not on file  Relationships  . Social connections:    Talks on phone: Not on file    Gets together: Not on file    Attends religious service: Not on file    Active member of club or organization: Not on file    Attends meetings of clubs or organizations: Not on file    Relationship status: Not on file  . Intimate partner violence:    Fear of current or ex partner: Not on file    Emotionally abused: Not on file    Physically abused: Not on file    Forced sexual activity: Not on file  Other Topics Concern  . Not on file  Social History Narrative  . Not on file    FAMILY HISTORY: Family History  Problem Relation Age of Onset  . Heart disease Father        deceased 53; had 47 siblings  . Hypertension Father   . Colon cancer Paternal Uncle 76       deceased 77s  . Hypertension Paternal Uncle   . Diabetes Maternal Aunt   . Hypertension Maternal Aunt   . Heart disease Mother        deceased at 22; had 7 siblings  . Hypertension Mother   . Uterine cancer Sister 36       currently 70; no children  . Breast cancer Cousin 39       currently 5; daughter of unaffected maternal aunt    ALLERGIES:  has No Known  Allergies.  MEDICATIONS:  Current Outpatient Medications  Medication Sig Dispense Refill  . amiodarone (PACERONE) 200 MG tablet Take 200 mg by mouth 2 (two) times a week. Sunday and Wednesday    . Cholecalciferol (VITAMIN D3) 50000 units CAPS Take 50,000 Units by mouth once a week. On Sunday  2  . citalopram (CELEXA) 40 MG tablet Take 40 mg by mouth daily.  6  . furosemide (LASIX) 20 MG tablet Take 20 mg by mouth daily.  2  . lisinopril-hydrochlorothiazide (PRINZIDE,ZESTORETIC) 10-12.5 MG tablet Take 1 tablet by mouth daily.  2  . XARELTO 20 MG TABS tablet Take 20 mg by mouth daily.  2  . HYDROcodone-acetaminophen (NORCO/VICODIN) 5-325 MG tablet Take 1 tablet by mouth every 6 (six) hours as needed for moderate pain. (Patient not taking: Reported on 09/22/2017) 15 tablet 0   No current facility-administered medications for this visit.  PHYSICAL EXAMINATION: ECOG PERFORMANCE STATUS: 1 - Symptomatic but completely ambulatory Vitals:   09/22/17 1018  BP: (!) 168/99  Pulse: (!) 58  Resp: (!) 21  Temp: (!) 96.1 F (35.6 C)   Filed Weights   09/22/17 1018  Weight: (!) 303 lb 2 oz (137.5 kg)    Physical Exam  Constitutional: She is oriented to person, place, and time. She appears well-developed and well-nourished. No distress.  Obese,   HENT:  Head: Normocephalic and atraumatic.  Right Ear: External ear normal.  Left Ear: External ear normal.  Mouth/Throat: Oropharynx is clear and moist.  Eyes: Pupils are equal, round, and reactive to light. Conjunctivae and EOM are normal. No scleral icterus.  Neck: Normal range of motion. Neck supple.  Cardiovascular: Normal rate, regular rhythm and normal heart sounds.  Pulmonary/Chest: Effort normal and breath sounds normal. No respiratory distress. She has no wheezes. She has no rales. She exhibits no tenderness.  Abdominal: Soft. Bowel sounds are normal. She exhibits no distension and no mass. There is no tenderness.  Musculoskeletal:  Normal range of motion. She exhibits no edema or deformity.  Lymphadenopathy:    She has no cervical adenopathy.  Neurological: She is alert and oriented to person, place, and time. No cranial nerve deficit. Coordination normal.  Skin: Skin is warm and dry. No rash noted.  Psychiatric: She has a normal mood and affect. Her behavior is normal. Thought content normal.  Breast exam was performed in seated and lying down position. Right upper outer quadrant lumpectomy, wound healed well, no erythema or discharge.  No evidence of bilateral axillary adenopathy. No palpable left breast mass.    LABORATORY DATA:  I have reviewed the data as listed Lab Results  Component Value Date   WBC 8.4 09/22/2017   HGB 12.4 09/22/2017   HCT 36.0 09/22/2017   MCV 89.4 09/22/2017   PLT 235 09/22/2017   Recent Labs    06/02/17 1736 07/29/17 0811 08/22/17 1530 09/22/17 0940  NA 131* 137 141 139  K 3.9 4.1 4.3 4.6  CL 97* 99* 104 105  CO2 '24 27 28 26  ' GLUCOSE 93 105* 92 106*  BUN 18 39* 20 28*  CREATININE 1.18* 1.29* 1.28* 1.28*  CALCIUM 8.0* 9.5 9.1 9.5  GFRNONAA 51* 46* 46* 46*  GFRAA 59* 53* 54* 54*  PROT 6.9 7.3  --  6.9  ALBUMIN 2.7* 4.0  --  3.5  AST 16 17  --  17  ALT 16 15  --  17  ALKPHOS 74 66  --  74  BILITOT 0.9 1.0  --  0.6  BILIDIR 0.3  --   --   --   IBILI 0.6  --   --   --        ASSESSMENT & PLAN:  1. Malignant neoplasm of upper-outer quadrant of right breast in female, estrogen receptor positive ([place])   Cancer Staging Malignant neoplasm of upper-outer quadrant of right breast in female, estrogen receptor positive ([place]) Staging form: Breast, AJCC 8th Edition - Clinical stage from 07/27/2017: Stage IA (cT1, cN0, cM0, G2, ER+, PR+, HER2: Equivocal) - Signed by Earlie Server, MD on 07/27/2017 - Pathologic: pT1c, pN0, cM0, ER+, PR+, HER2-, Oncotype DX score: 13 - Signed by Earlie Server, MD on 09/22/2017  Pathology was reviewed and discussed in detail with patient.  She has ER/PR  positive HER2 negative Breast cancer, Stage IA.  Oncotype Dx recurrence score 13, absoluate chemotherapy risk<1%. Discussed with patient that  adjuvant chemotherapy will not be offered.  Advise patient to follow up with RadOnc for adjuvant RT.   # Patient has IUD placed and advise patient follow up with Gyn to determine menopause state. FSH,LH estradiol.  Will start anti estrogen therapy after she finishes RT.  Patient advised to call breast cancer RN navigator when she is close to finish RT.   Patient and her sister had many questions and all questions were answered. The patient knows to call the clinic with any problems questions or concerns.  Return of visit: to be determined.   Total face to face encounter time for this patient visit was40 min. >50% of the time was  spent in counseling and coordination of care.   Earlie Server, MD, PhD Hematology Oncology [facility] Pager- [phone] 09/22/2017

## 2017-10-03 ENCOUNTER — Telehealth: Payer: Self-pay | Admitting: *Deleted

## 2017-10-03 NOTE — Telephone Encounter (Signed)
Patient left message for a return call at [phone].  3 Return calls made to patient without answer and no voicemail set up to leave a message.

## 2017-10-06 ENCOUNTER — Other Ambulatory Visit: Payer: Self-pay | Admitting: Neurological Surgery

## 2017-10-06 ENCOUNTER — Ambulatory Visit: Payer: 59 | Admitting: Radiation Oncology

## 2017-10-09 ENCOUNTER — Ambulatory Visit
Admission: RE | Admit: 2017-10-09 | Discharge: 2017-10-09 | Disposition: A | Discharge status: Home / Self Care | Payer: 59 | Source: Ambulatory Visit | Attending: Radiation Oncology | Admitting: Radiation Oncology

## 2017-10-09 ENCOUNTER — Other Ambulatory Visit: Payer: Self-pay

## 2017-10-09 ENCOUNTER — Encounter: Payer: Self-pay | Admitting: Radiation Oncology

## 2017-10-09 VITALS — BP 146/87 | HR 57 | Temp 98.5°F | Resp 20 | Wt 300.3 lb

## 2017-10-09 DIAGNOSIS — Z9889 Other specified postprocedural states: Secondary | ICD-10-CM | POA: Diagnosis not present

## 2017-10-09 DIAGNOSIS — C50911 Malignant neoplasm of unspecified site of right female breast: Secondary | ICD-10-CM | POA: Insufficient documentation

## 2017-10-09 DIAGNOSIS — Z170 Estrogen receptor positive status [ER+]: Secondary | ICD-10-CM | POA: Insufficient documentation

## 2017-10-09 DIAGNOSIS — C50411 Malignant neoplasm of upper-outer quadrant of right female breast: Secondary | ICD-10-CM

## 2017-10-09 NOTE — Progress Notes (Signed)
Radiation Oncology Follow up Note  Name: Heather Conrad   Date:   10/09/2017 MRN:  [id number] DOB: [birth year]    This 57 y.o. female presents to the clinic today for follow-up status post wide local excision of her right breast for a T1 N0 ER/PR positive HER-2/neu negative invasive mammary carcinoma.  REFERRING PROVIDER: Perrin Maltese, MD  HPI patient is a 57 year old female originally consult back in May 2019 for presumed T1 N0 invasive mammary carcinoma of the right breast. She went on to have a wide local excision June 20 showing a 1.1 cm invasive mammary carcinoma with 3 sentinel lymph nodes negative for metastatic disease.tumor was overall grade 1 margins were clear at greater than 0.2 cm all margins. Again tumor was ER/PR positive HER-2/neu negative.patient is seen medical oncology here and Oncotype DX showed a score of 13 low risk for recurrence. 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 She specifically denies breast tenderness cough or bone pain.  COMPLICATIONS OF TREATMENT: none  FOLLOW UP COMPLIANCE: keeps appointments   PHYSICAL EXAM:  BP (!) 146/87   Pulse (!) 57   Temp 98.5 F (36.9 C)   Resp 20   Wt (!) 300 lb 4.3 oz (136.2 kg)   BMI 51.54 kg/m  Right breast shows a well-healed incision site. No dominant mass or nodularity is noted in either breast in 2 positions examined. No axillary supra clavicular adenopathy is noted. Breasts are large and pendulous.Well-developed well-nourished patient in NAD. HEENT reveals PERLA, EOMI, discs not visualized.  Oral cavity is clear. No oral mucosal lesions are identified. Neck is clear without evidence of cervical or supraclavicular adenopathy. Lungs are clear to A&P. Cardiac examination is essentially unremarkable with regular rate and rhythm without murmur rub or thrill. Abdomen is benign with no organomegaly or masses noted. Motor sensory and DTR levels are equal and symmetric  in the upper and lower extremities. Cranial nerves II through XII are grossly intact. Proprioception is intact. No peripheral adenopathy or edema is identified. No motor or sensory levels are noted. Crude visual fields are within normal range.  RADIOLOGY RESULTS: no current films for review  PLAN: present time I have recommended whole breast radiation. Based on the large breast size do not think she is a candidate for hyperfractionated course of treatment. I would plan on delivering 5040 cGy in 28 fractions to her right breast. Would also boost her scar another 1400 cGy using electron beam. Risks and benefits of treatment including skin reaction fatigue alteration of blood counts possible inclusion of superficial lung all were discussed with the patient. She will have her back surgery next week am setting her up in the end of August for follow-up to see we can start commencing her radiation therapy treatments at that time. Patient also will be candidate for antiestrogen therapy after completion of radiation. Patient knows to call in the meantime with any concerns.  I would like to take this opportunity to thank you for allowing me to participate in the care of your patient.Noreene Filbert, MD

## 2017-10-10 NOTE — Pre-Procedure Instructions (Signed)
Heather Conrad  10/10/2017      [id number] [address], [place] AT [facility] OF [address] [place] [address] Phone: [phone] Fax: [phone]    Your procedure is scheduled on Wed., October 15, 2017 from 12:55PM-3:12PM  Report to [facility] Admitting Entrance "A" at 10:50AM  Call this number if you have problems the morning of surgery:  [phone]   Remember:  Do not eat or drink after midnight on July 30th    Take these medicines the morning of surgery with A SIP OF WATER: Amiodarone (PACERONE) and Citalopram (CELEXA)  If needed Lubricant eye drops   Follow your surgeon's instructions on when to stop Xarelto.  If no instructions were given by your surgeon then you will need to call the office to get those instructions.    As of today, stop taking all Other Aspirin Products, Vitamins, Fish oils, and Herbal medications. Also stop all NSAIDS i.e. Advil, Ibuprofen, Motrin, Aleve, Anaprox, Naproxen, BC, Goody Powders, and all Supplements.    Do not wear jewelry, make-up or nail polish.  Do not wear lotions, powders, or perfumes, or deodorant.  Do not shave 48 hours prior to surgery.    Do not bring valuables to the hospital.  [facility] is not responsible for any belongings or valuables.  Contacts, dentures or bridgework may not be worn into surgery.  Leave your suitcase in the car.  After surgery it may be brought to your room.  For patients admitted to the hospital, discharge time will be determined by your treatment team.  Patients discharged the day of surgery will not be allowed to drive home.   Special instructions:  [place]- Preparing For Surgery  Before surgery, you can play an important role. Because skin is not sterile, your skin needs to be as free of germs as possible. You can reduce the number of germs on your skin by washing with CHG (chlorahexidine gluconate) Soap before surgery.  CHG is  an antiseptic cleaner which kills germs and bonds with the skin to continue killing germs even after washing.    Oral Hygiene is also important to reduce your risk of infection.  Remember - BRUSH YOUR TEETH THE MORNING OF SURGERY WITH YOUR REGULAR TOOTHPASTE  Please do not use if you have an allergy to CHG or antibacterial soaps. If your skin becomes reddened/irritated stop using the CHG.  Do not shave (including legs and underarms) for at least 48 hours prior to first CHG shower. It is OK to shave your face.  Please follow these instructions carefully.   1. Shower the NIGHT BEFORE SURGERY and the MORNING OF SURGERY with CHG.   2. If you chose to wash your hair, wash your hair first as usual with your normal shampoo.  3. After you shampoo, rinse your hair and body thoroughly to remove the shampoo.  4. Use CHG as you would any other liquid soap. You can apply CHG directly to the skin and wash gently with a scrungie or a clean washcloth.   5. Apply the CHG Soap to your body ONLY FROM THE NECK DOWN.  Do not use on open wounds or open sores. Avoid contact with your eyes, ears, mouth and genitals (private parts). Wash Face and genitals (private parts)  with your normal soap.  6. Wash thoroughly, paying special attention to the area where your surgery will be  performed.  7. Thoroughly rinse your body with warm water from the neck down.  8. DO NOT shower/wash with your normal soap after using and rinsing off the CHG Soap.  9. Pat yourself dry with a CLEAN TOWEL.  10. Wear CLEAN PAJAMAS to bed the night before surgery, wear comfortable clothes the morning of surgery  11. Place CLEAN SHEETS on your bed the night of your first shower and DO NOT SLEEP WITH PETS.  Day of Surgery:  Do not apply any deodorants/lotions.  Please wear clean clothes to the hospital/surgery center.   Remember to brush your teeth WITH YOUR REGULAR TOOTHPASTE.  Please read over the following fact sheets that you were  given. Pain Booklet, Coughing and Deep Breathing, MRSA Information and Surgical Site Infection Prevention

## 2017-10-13 ENCOUNTER — Ambulatory Visit (HOSPITAL_COMMUNITY)
Admission: RE | Admit: 2017-10-13 | Discharge: 2017-10-13 | Disposition: A | Discharge status: Home / Self Care | Payer: 59 | Source: Ambulatory Visit | Attending: Neurological Surgery | Admitting: Neurological Surgery

## 2017-10-13 ENCOUNTER — Other Ambulatory Visit: Payer: Self-pay

## 2017-10-13 ENCOUNTER — Encounter (HOSPITAL_COMMUNITY)
Admission: RE | Admit: 2017-10-13 | Discharge: 2017-10-13 | Disposition: A | Discharge status: Home / Self Care | Payer: 59 | Source: Ambulatory Visit | Attending: Neurological Surgery | Admitting: Neurological Surgery

## 2017-10-13 ENCOUNTER — Encounter (HOSPITAL_COMMUNITY): Payer: Self-pay

## 2017-10-13 DIAGNOSIS — M48061 Spinal stenosis, lumbar region without neurogenic claudication: Secondary | ICD-10-CM

## 2017-10-13 HISTORY — DX: Personal history of urinary calculi: Z87.442

## 2017-10-13 HISTORY — DX: Malignant neoplasm of unspecified site of unspecified female breast: C50.919

## 2017-10-13 HISTORY — DX: Malignant (primary) neoplasm, unspecified: C80.1

## 2017-10-13 HISTORY — DX: Spinal stenosis, lumbar region without neurogenic claudication: M48.061

## 2017-10-13 HISTORY — DX: Essential (primary) hypertension: I10

## 2017-10-13 LAB — CBC WITH DIFFERENTIAL/PLATELET
ABS IMMATURE GRANULOCYTES: 0 10*3/uL (ref 0.0–0.1)
BASOS ABS: 0 10*3/uL (ref 0.0–0.1)
Basophils Relative: 1 %
EOS ABS: 0.3 10*3/uL (ref 0.0–0.7)
Eosinophils Relative: 4 %
HCT: 38.1 % (ref 36.0–46.0)
Hemoglobin: 12.4 g/dL (ref 12.0–15.0)
IMMATURE GRANULOCYTES: 0 %
Lymphocytes Relative: 26 %
Lymphs Abs: 2 10*3/uL (ref 0.7–4.0)
MCH: 30.2 pg (ref 26.0–34.0)
MCHC: 32.5 g/dL (ref 30.0–36.0)
MCV: 92.9 fL (ref 78.0–100.0)
MONOS PCT: 7 %
Monocytes Absolute: 0.5 10*3/uL (ref 0.1–1.0)
NEUTROS ABS: 4.7 10*3/uL (ref 1.7–7.7)
NEUTROS PCT: 62 %
PLATELETS: 262 10*3/uL (ref 150–400)
RBC: 4.1 MIL/uL (ref 3.87–5.11)
RDW: 13.8 % (ref 11.5–15.5)
WBC: 7.7 10*3/uL (ref 4.0–10.5)

## 2017-10-13 LAB — SURGICAL PCR SCREEN
MRSA, PCR: NEGATIVE
Staphylococcus aureus: NEGATIVE

## 2017-10-13 LAB — PROTIME-INR
INR: 1.61
PROTHROMBIN TIME: 19 s — AB (ref 11.4–15.2)

## 2017-10-13 LAB — BASIC METABOLIC PANEL
ANION GAP: 10 (ref 5–15)
BUN: 17 mg/dL (ref 6–20)
CO2: 29 mmol/L (ref 22–32)
Calcium: 9.3 mg/dL (ref 8.9–10.3)
Chloride: 103 mmol/L (ref 98–111)
Creatinine, Ser: 1.24 mg/dL — ABNORMAL HIGH (ref 0.44–1.00)
GFR calc non Af Amer: 48 mL/min — ABNORMAL LOW (ref >60)
GFR, EST AFRICAN AMERICAN: 56 mL/min — AB (ref >60)
Glucose, Bld: 99 mg/dL (ref 70–99)
Potassium: 3.8 mmol/L (ref 3.5–5.1)
SODIUM: 142 mmol/L (ref 135–145)

## 2017-10-13 NOTE — Progress Notes (Addendum)
PCP - Dr. Riley Lam  Cardiologist - Dr. Celesta Gentile  Chest x-ray - 10/13/17  EKG - 06/27/17 -In Previous Anes. Consult note  Stress Test - 07/18/17 Previous note  ECHO - 07/22/17- Previous note  Cardiac Cath - Denies  Sleep Study - Denies CPAP - None  LABS- 10/13/17: CBC w/D, BMP, PT, PCR  ASA-  Denies Xarelto- LD- 7/28   Lorriane Shire- Yes- Previous note from 08/22/17- All cardiac results listed.  Pt denies having chest pain, sob, or fever at this time. All instructions explained to the pt, with a verbal understanding of the material. Pt agrees to go over the instructions while at home for a better understanding. The opportunity to ask questions was provided.

## 2017-10-14 MED ORDER — DEXTROSE 5 % IV SOLN
3.0000 g | INTRAVENOUS | Status: AC
  Administered 2017-10-15: 3 g via INTRAVENOUS
  Filled 2017-10-14: qty 3

## 2017-10-14 NOTE — Progress Notes (Signed)
Anesthesia Chart Review:  Case:  [id number] Date/Time:  10/15/17 1240   Procedure:  Laminectomy and Foraminotomy - L1-L2 - L2-L3 - L3-L4 - bilateral (Bilateral Back)   Anesthesia type:  General   Pre-op diagnosis:  Stenosis   Location:  [facility] OR ROOM 19 / [place] OR   Surgeon:  Eustace Moore, MD      DISCUSSION: 57 yo female never smoker for above procedure. Pertinent hx includes Anxiety, Obesity, HTN, HLD, arthritis, atrial fibrillation, moderate mitral regurgitation.   Pt recently had breast lumpectomy 4/69/6295 without complication. Prior to that surgery she had clearance from her cardiologist Dr. Neoma Laming on 08/04/2017 stating she was low risk for surgery, hold Xarelto 3d prior to surgery. Clearance note scanned under Media tab labeled Correspondence date 09/02/2017.  Anticipate she can proceed as scheduled barring acute status change.  VS: BP 139/68   Pulse (!) 55   Temp 36.8 C   Resp 20   Ht 5\' 4"  (1.626 m)   Wt (!) 303 lb 4.8 oz (137.6 kg)   SpO2 98%   BMI 52.06 kg/m   PROVIDERS: Perrin Maltese, MD is PCP  Neoma Laming, MD is Cardiologist last seen 08/04/2017   LABS: Labs reviewed: Acceptable for surgery. (all labs ordered are listed, but only abnormal results are displayed)  Labs Reviewed  BASIC METABOLIC PANEL - Abnormal; Notable for the following components:      Result Value   Creatinine, Ser 1.24 (*)    GFR calc non Af Amer 48 (*)    GFR calc Af Amer 56 (*)    All other components within normal limits  PROTIME-INR - Abnormal; Notable for the following components:   Prothrombin Time 19.0 (*)    All other components within normal limits  SURGICAL PCR SCREEN  CBC WITH DIFFERENTIAL/PLATELET     IMAGES: CHEST - 2 VIEW 10/13/2017  COMPARISON:  None in PACs  FINDINGS: The lungs are adequately inflated. There is no focal infiltrate. There is no pleural effusion. The heart is mildly enlarged. The pulmonary vascularity is normal. The mediastinum is normal in  width. There is mild multilevel degenerative disc disease of the thoracic spine.  IMPRESSION: There is no pneumonia nor other acute cardiopulmonary abnormality.   EKG (scanned under Media tab Correspondence 09/02/2017): 06/27/17 (Dr. Laurelyn Sickle office): sinus bradycardia (48 bpm). LAD. Minimal voltage criteria for LVH, may be normal variant. Anterior infarct, age undetermined. ST & T wave abnormality, consider lateral ischemia  CV (All cardiac studies scanned under Media tab Correspondence 09/02/2017): Cardiac CTA 07/23/17 (Dr. Laurelyn Sickle office):  1.  Quality of study: Excellent 2.  Calcium score 0 3.  Right dominant system. 4.  Normal coronary  Echo 07/22/17 (Dr. Laurelyn Sickle office):  - Technically difficult study due to body habitus - Severely  Dilated LA and mildly dilated RA with ventricles and aorta appearing normal in size - Normal LV systolic function. Normal wall motion. Mild LVH with GRADE I diastolic dysfunction.  - EF 71% -Trace pulmonary regurgitation - Mild tricuspid regurgitation - Normal pulmonary artery pressure - Moderate mitral regurgitation.   - Mild to moderate aortic regurgitation - No pericardial effusion - No evidence of thrombi - Incidental finding on the left lobe of liver; rounded echogenic measuring 3.8 x 3.5 cm  Nuclear stress test 07/18/2017 (Dr. Sharol Roussel office):  - Equivocal stress test with normal EF, submaximal stress. - Cardiac CTA ordered, see above  Past Medical History:  Diagnosis Date  . Acute renal failure (ARF) (HCC)  01/2012  . Anxiety   . Arthritis   . Breast cancer in female [facility])    Right  . Cancer ([place])   . Dyspnea   . Dysrhythmia 01/2017   atrial fibrillation  . Heart murmur    per pt report  . History of kidney stones   . History of MRSA infection 2013   leg, abdomen, arm  . Hyperlipidemia 2018  . Hypertension   . Kidney stone 01/2017  . Lumbar stenosis   . Obesity 01/2017  . Pyelonephritis   . UTI (urinary tract infection)      Past Surgical History:  Procedure Laterality Date  . BREAST BIOPSY Right 07/16/2017   pending path  . BREAST LUMPECTOMY WITH RADIOACTIVE SEED AND SENTINEL LYMPH NODE BIOPSY Right 09/01/2017   Procedure: BREAST LUMPECTOMY WITH RADIOACTIVE SEED AND SENTINEL LYMPH NODE BIOPSY;  Surgeon: Alphonsa Overall, MD;  Location: [place];  Service: General;  Laterality: Right;  . CHOLECYSTECTOMY     1992  . DILATION AND CURETTAGE OF UTERUS    . EXTRACORPOREAL SHOCK WAVE LITHOTRIPSY Left 02/13/2017   Procedure: EXTRACORPOREAL SHOCK WAVE LITHOTRIPSY (ESWL);  Surgeon: Royston Cowper, MD;  Location: [facility];  Service: Urology;  Laterality: Left;  . FOOT FRACTURE SURGERY Right 2018   repair of trigonem of ankle. endoscopic fasciotomy  . KNEE ARTHROSCOPY Right 2013   meniscus  . KNEE SURGERY Right    knee scope for torn meniscus    MEDICATIONS: . amiodarone (PACERONE) 200 MG tablet  . Carboxymethylcellul-Glycerin (LUBRICATING EYE DROPS OP)  . Cholecalciferol (VITAMIN D3) 50000 units CAPS  . citalopram (CELEXA) 40 MG tablet  . furosemide (LASIX) 20 MG tablet  . HYDROcodone-acetaminophen (NORCO/VICODIN) 5-325 MG tablet  . lisinopril-hydrochlorothiazide (PRINZIDE,ZESTORETIC) 10-12.5 MG tablet  . XARELTO 20 MG TABS tablet   No current facility-administered medications for this encounter.     Wynonia Musty [facility] Short Stay Center/Anesthesiology Phone [phone] 10/14/2017 9:25 AM

## 2017-10-15 ENCOUNTER — Encounter (HOSPITAL_COMMUNITY)
Admission: RE | Disposition: A | Discharge status: Home Health | Payer: Self-pay | Source: Home / Self Care | Attending: Neurological Surgery

## 2017-10-15 ENCOUNTER — Inpatient Hospital Stay (HOSPITAL_COMMUNITY): Payer: 59 | Admitting: Certified Registered Nurse Anesthetist

## 2017-10-15 ENCOUNTER — Inpatient Hospital Stay (HOSPITAL_COMMUNITY): Payer: 59 | Admitting: Physician Assistant

## 2017-10-15 ENCOUNTER — Other Ambulatory Visit: Payer: Self-pay

## 2017-10-15 ENCOUNTER — Inpatient Hospital Stay (HOSPITAL_COMMUNITY): Payer: 59

## 2017-10-15 ENCOUNTER — Encounter (HOSPITAL_COMMUNITY): Payer: Self-pay | Admitting: Surgery

## 2017-10-15 ENCOUNTER — Inpatient Hospital Stay (HOSPITAL_COMMUNITY)
Admission: RE | Admit: 2017-10-15 | Discharge: 2017-10-17 | DRG: 516 | Disposition: A | Discharge status: Home Health | Payer: 59 | Attending: Neurological Surgery | Admitting: Neurological Surgery

## 2017-10-15 DIAGNOSIS — Z8249 Family history of ischemic heart disease and other diseases of the circulatory system: Secondary | ICD-10-CM

## 2017-10-15 DIAGNOSIS — R269 Unspecified abnormalities of gait and mobility: Secondary | ICD-10-CM | POA: Diagnosis present

## 2017-10-15 DIAGNOSIS — Z419 Encounter for procedure for purposes other than remedying health state, unspecified: Secondary | ICD-10-CM

## 2017-10-15 DIAGNOSIS — M48061 Spinal stenosis, lumbar region without neurogenic claudication: Principal | ICD-10-CM | POA: Diagnosis present

## 2017-10-15 DIAGNOSIS — Z9889 Other specified postprocedural states: Secondary | ICD-10-CM

## 2017-10-15 DIAGNOSIS — I10 Essential (primary) hypertension: Secondary | ICD-10-CM | POA: Diagnosis present

## 2017-10-15 DIAGNOSIS — Z6841 Body Mass Index (BMI) 40.0 and over, adult: Secondary | ICD-10-CM

## 2017-10-15 DIAGNOSIS — Z7901 Long term (current) use of anticoagulants: Secondary | ICD-10-CM

## 2017-10-15 HISTORY — PX: LUMBAR LAMINECTOMY/DECOMPRESSION MICRODISCECTOMY: SHX5026

## 2017-10-15 LAB — PROTIME-INR
INR: 1.01
Prothrombin Time: 13.2 seconds (ref 11.4–15.2)

## 2017-10-15 LAB — POCT PREGNANCY, URINE: PREG TEST UR: NEGATIVE

## 2017-10-15 SURGERY — LUMBAR LAMINECTOMY/DECOMPRESSION MICRODISCECTOMY 3 LEVELS
Anesthesia: General | Site: Back | Laterality: Bilateral

## 2017-10-15 MED ORDER — BUPIVACAINE HCL (PF) 0.25 % IJ SOLN
INTRAMUSCULAR | Status: AC
  Filled 2017-10-15: qty 30

## 2017-10-15 MED ORDER — SENNA 8.6 MG PO TABS
1.0000 | ORAL_TABLET | Freq: Two times a day (BID) | ORAL | Status: DC
  Administered 2017-10-15 – 2017-10-17 (×4): 8.6 mg via ORAL
  Filled 2017-10-15 (×4): qty 1

## 2017-10-15 MED ORDER — HYDROMORPHONE HCL 1 MG/ML IJ SOLN
0.5000 mg | INTRAMUSCULAR | Status: DC | PRN
  Administered 2017-10-15: 0.5 mg via INTRAVENOUS
  Filled 2017-10-15: qty 0.5

## 2017-10-15 MED ORDER — PHENYLEPHRINE 40 MCG/ML (10ML) SYRINGE FOR IV PUSH (FOR BLOOD PRESSURE SUPPORT)
PREFILLED_SYRINGE | INTRAVENOUS | Status: AC
  Filled 2017-10-15: qty 10

## 2017-10-15 MED ORDER — LIDOCAINE 2% (20 MG/ML) 5 ML SYRINGE
INTRAMUSCULAR | Status: AC
  Filled 2017-10-15: qty 5

## 2017-10-15 MED ORDER — ONDANSETRON HCL 4 MG/2ML IJ SOLN: 4.0000 mg | Freq: Four times a day (QID) | INTRAMUSCULAR | Status: DC | PRN

## 2017-10-15 MED ORDER — POTASSIUM CHLORIDE IN NACL 20-0.9 MEQ/L-% IV SOLN: INTRAVENOUS | Status: DC

## 2017-10-15 MED ORDER — 0.9 % SODIUM CHLORIDE (POUR BTL) OPTIME
TOPICAL | Status: DC | PRN
  Administered 2017-10-15: 1000 mL

## 2017-10-15 MED ORDER — PROPOFOL 10 MG/ML IV BOLUS
INTRAVENOUS | Status: AC
Start: 2017-10-15 — End: ?
  Filled 2017-10-15: qty 20

## 2017-10-15 MED ORDER — SUCCINYLCHOLINE CHLORIDE 200 MG/10ML IV SOSY
PREFILLED_SYRINGE | INTRAVENOUS | Status: DC | PRN
  Administered 2017-10-15: 100 mg via INTRAVENOUS

## 2017-10-15 MED ORDER — LACTATED RINGERS IV SOLN
INTRAVENOUS | Status: DC | PRN
  Administered 2017-10-15: 12:00:00 via INTRAVENOUS

## 2017-10-15 MED ORDER — METHOCARBAMOL 500 MG PO TABS
ORAL_TABLET | ORAL | Status: AC
  Filled 2017-10-15: qty 1

## 2017-10-15 MED ORDER — CEFAZOLIN SODIUM-DEXTROSE 2-4 GM/100ML-% IV SOLN
2.0000 g | Freq: Three times a day (TID) | INTRAVENOUS | Status: AC
  Administered 2017-10-15 – 2017-10-16 (×2): 2 g via INTRAVENOUS
  Filled 2017-10-15 (×2): qty 100

## 2017-10-15 MED ORDER — CHLORHEXIDINE GLUCONATE CLOTH 2 % EX PADS: 6.0000 | MEDICATED_PAD | Freq: Once | CUTANEOUS | Status: DC

## 2017-10-15 MED ORDER — ACETAMINOPHEN 650 MG RE SUPP: 650.0000 mg | RECTAL | Status: DC | PRN

## 2017-10-15 MED ORDER — DEXAMETHASONE SODIUM PHOSPHATE 4 MG/ML IJ SOLN
4.0000 mg | Freq: Four times a day (QID) | INTRAMUSCULAR | Status: DC
  Administered 2017-10-15 (×2): 4 mg via INTRAVENOUS
  Filled 2017-10-15 (×2): qty 1

## 2017-10-15 MED ORDER — PHENYLEPHRINE 40 MCG/ML (10ML) SYRINGE FOR IV PUSH (FOR BLOOD PRESSURE SUPPORT)
PREFILLED_SYRINGE | INTRAVENOUS | Status: DC | PRN
  Administered 2017-10-15 (×2): 40 ug via INTRAVENOUS

## 2017-10-15 MED ORDER — HEMOSTATIC AGENTS (NO CHARGE) OPTIME
TOPICAL | Status: DC | PRN
  Administered 2017-10-15 (×3): 1

## 2017-10-15 MED ORDER — SUCCINYLCHOLINE CHLORIDE 200 MG/10ML IV SOSY
PREFILLED_SYRINGE | INTRAVENOUS | Status: AC
Start: 2017-10-15 — End: ?
  Filled 2017-10-15: qty 10

## 2017-10-15 MED ORDER — FENTANYL CITRATE (PF) 250 MCG/5ML IJ SOLN
INTRAMUSCULAR | Status: AC
  Filled 2017-10-15: qty 5

## 2017-10-15 MED ORDER — MENTHOL 3 MG MT LOZG: 1.0000 | LOZENGE | OROMUCOSAL | Status: DC | PRN

## 2017-10-15 MED ORDER — DEXAMETHASONE 4 MG PO TABS
4.0000 mg | ORAL_TABLET | Freq: Four times a day (QID) | ORAL | Status: DC
  Administered 2017-10-16 – 2017-10-17 (×6): 4 mg via ORAL
  Filled 2017-10-15 (×6): qty 1

## 2017-10-15 MED ORDER — PHENYLEPHRINE 40 MCG/ML (10ML) SYRINGE FOR IV PUSH (FOR BLOOD PRESSURE SUPPORT)
PREFILLED_SYRINGE | INTRAVENOUS | Status: AC
Start: 2017-10-15 — End: ?
  Filled 2017-10-15: qty 10

## 2017-10-15 MED ORDER — FUROSEMIDE 20 MG PO TABS
20.0000 mg | ORAL_TABLET | Freq: Every day | ORAL | Status: DC
  Administered 2017-10-16 – 2017-10-17 (×2): 20 mg via ORAL
  Filled 2017-10-15 (×2): qty 1

## 2017-10-15 MED ORDER — THROMBIN 5000 UNITS EX SOLR
CUTANEOUS | Status: AC
  Filled 2017-10-15: qty 5000

## 2017-10-15 MED ORDER — PROPOFOL 10 MG/ML IV BOLUS
INTRAVENOUS | Status: DC | PRN
  Administered 2017-10-15: 50 mg via INTRAVENOUS
  Administered 2017-10-15: 150 mg via INTRAVENOUS

## 2017-10-15 MED ORDER — EPHEDRINE 5 MG/ML INJ
INTRAVENOUS | Status: AC
Start: 2017-10-15 — End: ?
  Filled 2017-10-15: qty 10

## 2017-10-15 MED ORDER — ACETAMINOPHEN 325 MG PO TABS
650.0000 mg | ORAL_TABLET | ORAL | Status: DC | PRN
  Administered 2017-10-16 – 2017-10-17 (×2): 650 mg via ORAL
  Filled 2017-10-15 (×2): qty 2

## 2017-10-15 MED ORDER — MIDAZOLAM HCL 5 MG/5ML IJ SOLN
INTRAMUSCULAR | Status: DC | PRN
  Administered 2017-10-15: 2 mg via INTRAVENOUS

## 2017-10-15 MED ORDER — EPHEDRINE SULFATE-NACL 50-0.9 MG/10ML-% IV SOSY
PREFILLED_SYRINGE | INTRAVENOUS | Status: DC | PRN
  Administered 2017-10-15 (×3): 5 mg via INTRAVENOUS

## 2017-10-15 MED ORDER — LISINOPRIL 10 MG PO TABS
10.0000 mg | ORAL_TABLET | Freq: Every day | ORAL | Status: DC
  Administered 2017-10-16 – 2017-10-17 (×2): 10 mg via ORAL
  Filled 2017-10-15 (×2): qty 1

## 2017-10-15 MED ORDER — SODIUM CHLORIDE 0.9 % IV SOLN
250.0000 mL | INTRAVENOUS | Status: DC
  Administered 2017-10-15: 250 mL via INTRAVENOUS

## 2017-10-15 MED ORDER — LISINOPRIL-HYDROCHLOROTHIAZIDE 10-12.5 MG PO TABS: 1.0000 | ORAL_TABLET | Freq: Every day | ORAL | Status: DC

## 2017-10-15 MED ORDER — SUGAMMADEX SODIUM 500 MG/5ML IV SOLN
INTRAVENOUS | Status: AC
  Filled 2017-10-15: qty 5

## 2017-10-15 MED ORDER — FENTANYL CITRATE (PF) 100 MCG/2ML IJ SOLN
INTRAMUSCULAR | Status: AC
  Filled 2017-10-15: qty 2

## 2017-10-15 MED ORDER — ONDANSETRON HCL 4 MG PO TABS: 4.0000 mg | ORAL_TABLET | Freq: Four times a day (QID) | ORAL | Status: DC | PRN

## 2017-10-15 MED ORDER — LIDOCAINE 2% (20 MG/ML) 5 ML SYRINGE
INTRAMUSCULAR | Status: DC | PRN
  Administered 2017-10-15: 100 mg via INTRAVENOUS

## 2017-10-15 MED ORDER — DEXAMETHASONE SODIUM PHOSPHATE 10 MG/ML IJ SOLN
INTRAMUSCULAR | Status: AC
Start: 2017-10-15 — End: ?
  Filled 2017-10-15: qty 1

## 2017-10-15 MED ORDER — METHOCARBAMOL 1000 MG/10ML IJ SOLN
500.0000 mg | Freq: Four times a day (QID) | INTRAVENOUS | Status: DC | PRN
  Filled 2017-10-15: qty 5

## 2017-10-15 MED ORDER — SODIUM CHLORIDE 0.9% FLUSH
3.0000 mL | Freq: Two times a day (BID) | INTRAVENOUS | Status: DC
  Administered 2017-10-16 – 2017-10-17 (×3): 3 mL via INTRAVENOUS

## 2017-10-15 MED ORDER — METHOCARBAMOL 500 MG PO TABS
500.0000 mg | ORAL_TABLET | Freq: Four times a day (QID) | ORAL | Status: DC | PRN
  Administered 2017-10-15: 500 mg via ORAL

## 2017-10-15 MED ORDER — CITALOPRAM HYDROBROMIDE 40 MG PO TABS
40.0000 mg | ORAL_TABLET | Freq: Every day | ORAL | Status: DC
  Administered 2017-10-16 – 2017-10-17 (×2): 40 mg via ORAL
  Filled 2017-10-15 (×2): qty 1

## 2017-10-15 MED ORDER — THROMBIN 5000 UNITS EX SOLR
CUTANEOUS | Status: DC | PRN
  Administered 2017-10-15: 14:00:00

## 2017-10-15 MED ORDER — BUPIVACAINE HCL (PF) 0.25 % IJ SOLN
INTRAMUSCULAR | Status: DC | PRN
  Administered 2017-10-15: 5 mL

## 2017-10-15 MED ORDER — SODIUM CHLORIDE 0.9 % IV SOLN
INTRAVENOUS | Status: DC | PRN
  Administered 2017-10-15: 10 ug/min via INTRAVENOUS

## 2017-10-15 MED ORDER — HYDROCODONE-ACETAMINOPHEN 7.5-325 MG PO TABS
ORAL_TABLET | ORAL | Status: AC
  Filled 2017-10-15: qty 1

## 2017-10-15 MED ORDER — THROMBIN 5000 UNITS EX SOLR
CUTANEOUS | Status: AC
Start: 2017-10-15 — End: ?
  Filled 2017-10-15: qty 10000

## 2017-10-15 MED ORDER — HYDROMORPHONE HCL 1 MG/ML IJ SOLN
INTRAMUSCULAR | Status: AC
  Filled 2017-10-15: qty 1

## 2017-10-15 MED ORDER — ONDANSETRON HCL 4 MG/2ML IJ SOLN
INTRAMUSCULAR | Status: AC
  Filled 2017-10-15: qty 2

## 2017-10-15 MED ORDER — FENTANYL CITRATE (PF) 100 MCG/2ML IJ SOLN
25.0000 ug | INTRAMUSCULAR | Status: DC | PRN
  Administered 2017-10-15 (×3): 50 ug via INTRAVENOUS

## 2017-10-15 MED ORDER — PHENOL 1.4 % MT LIQD: 1.0000 | OROMUCOSAL | Status: DC | PRN

## 2017-10-15 MED ORDER — HYDROCODONE-ACETAMINOPHEN 7.5-325 MG PO TABS
1.0000 | ORAL_TABLET | Freq: Four times a day (QID) | ORAL | Status: DC
  Administered 2017-10-15 – 2017-10-17 (×8): 1 via ORAL
  Filled 2017-10-15 (×7): qty 1

## 2017-10-15 MED ORDER — PROMETHAZINE HCL 25 MG/ML IJ SOLN: 6.2500 mg | INTRAMUSCULAR | Status: DC | PRN

## 2017-10-15 MED ORDER — HYDROMORPHONE HCL 1 MG/ML IJ SOLN
0.5000 mg | INTRAMUSCULAR | Status: AC | PRN
  Administered 2017-10-15 (×2): 0.5 mg via INTRAVENOUS

## 2017-10-15 MED ORDER — DEXAMETHASONE SODIUM PHOSPHATE 10 MG/ML IJ SOLN
INTRAMUSCULAR | Status: AC
  Filled 2017-10-15: qty 1

## 2017-10-15 MED ORDER — FENTANYL CITRATE (PF) 250 MCG/5ML IJ SOLN
INTRAMUSCULAR | Status: DC | PRN
  Administered 2017-10-15: 50 ug via INTRAVENOUS
  Administered 2017-10-15: 100 ug via INTRAVENOUS
  Administered 2017-10-15 (×2): 50 ug via INTRAVENOUS

## 2017-10-15 MED ORDER — SODIUM CHLORIDE 0.9% FLUSH
3.0000 mL | INTRAVENOUS | Status: DC | PRN
Start: 2017-10-15 — End: 2017-10-17

## 2017-10-15 MED ORDER — AMIODARONE HCL 200 MG PO TABS
200.0000 mg | ORAL_TABLET | ORAL | Status: DC
  Administered 2017-10-16: 200 mg via ORAL

## 2017-10-15 MED ORDER — CELECOXIB 200 MG PO CAPS
200.0000 mg | ORAL_CAPSULE | Freq: Two times a day (BID) | ORAL | Status: DC
  Administered 2017-10-15 – 2017-10-17 (×4): 200 mg via ORAL
  Filled 2017-10-15 (×4): qty 1

## 2017-10-15 MED ORDER — HYDROCHLOROTHIAZIDE 12.5 MG PO CAPS
12.5000 mg | ORAL_CAPSULE | Freq: Every day | ORAL | Status: DC
  Administered 2017-10-16 – 2017-10-17 (×2): 12.5 mg via ORAL
  Filled 2017-10-15 (×2): qty 1

## 2017-10-15 MED ORDER — SODIUM CHLORIDE 0.9 % IV SOLN
INTRAVENOUS | Status: DC | PRN
  Administered 2017-10-15: 500 mL

## 2017-10-15 MED ORDER — ROCURONIUM BROMIDE 10 MG/ML (PF) SYRINGE
PREFILLED_SYRINGE | INTRAVENOUS | Status: DC | PRN
  Administered 2017-10-15: 60 mg via INTRAVENOUS
  Administered 2017-10-15: 20 mg via INTRAVENOUS

## 2017-10-15 MED ORDER — DEXAMETHASONE SODIUM PHOSPHATE 10 MG/ML IJ SOLN
10.0000 mg | INTRAMUSCULAR | Status: AC
Start: 2017-10-15 — End: 2017-10-15
  Administered 2017-10-15: 10 mg via INTRAVENOUS
  Filled 2017-10-15: qty 1

## 2017-10-15 MED ORDER — ONDANSETRON HCL 4 MG/2ML IJ SOLN
INTRAMUSCULAR | Status: DC | PRN
  Administered 2017-10-15: 4 mg via INTRAVENOUS

## 2017-10-15 MED ORDER — MIDAZOLAM HCL 2 MG/2ML IJ SOLN
INTRAMUSCULAR | Status: AC
  Filled 2017-10-15: qty 2

## 2017-10-15 MED ORDER — SUGAMMADEX SODIUM 200 MG/2ML IV SOLN
INTRAVENOUS | Status: DC | PRN
  Administered 2017-10-15: 275.2 mg via INTRAVENOUS

## 2017-10-15 SURGICAL SUPPLY — 47 items
BAG DECANTER FOR FLEXI CONT (MISCELLANEOUS) ×3 IMPLANT
BENZOIN TINCTURE PRP APPL 2/3 (GAUZE/BANDAGES/DRESSINGS) ×3 IMPLANT
BUR MATCHSTICK NEURO 3.0 LAGG (BURR) ×3 IMPLANT
CANISTER SUCT 3000ML PPV (MISCELLANEOUS) ×3 IMPLANT
CARTRIDGE OIL MAESTRO DRILL (MISCELLANEOUS) ×1 IMPLANT
CLOSURE WOUND 1/2 X4 (GAUZE/BANDAGES/DRESSINGS) ×1
DIFFUSER DRILL AIR PNEUMATIC (MISCELLANEOUS) ×3 IMPLANT
DRAPE LAPAROTOMY 100X72X124 (DRAPES) ×3 IMPLANT
DRAPE MICROSCOPE LEICA (MISCELLANEOUS) IMPLANT
DRAPE SURG 17X23 STRL (DRAPES) ×3 IMPLANT
DRSG OPSITE POSTOP 4X6 (GAUZE/BANDAGES/DRESSINGS) ×3 IMPLANT
DURAPREP 26ML APPLICATOR (WOUND CARE) ×3 IMPLANT
ELECT REM PT RETURN 9FT ADLT (ELECTROSURGICAL) ×3
ELECTRODE REM PT RTRN 9FT ADLT (ELECTROSURGICAL) ×1 IMPLANT
EVACUATOR 1/8 PVC DRAIN (DRAIN) ×3 IMPLANT
GAUZE SPONGE 4X4 16PLY XRAY LF (GAUZE/BANDAGES/DRESSINGS) IMPLANT
GLOVE BIO SURGEON STRL SZ7 (GLOVE) ×6 IMPLANT
GLOVE BIO SURGEON STRL SZ8 (GLOVE) ×6 IMPLANT
GLOVE BIOGEL PI IND STRL 7.0 (GLOVE) ×1 IMPLANT
GLOVE BIOGEL PI IND STRL 7.5 (GLOVE) ×3 IMPLANT
GLOVE BIOGEL PI INDICATOR 7.0 (GLOVE) ×2
GLOVE BIOGEL PI INDICATOR 7.5 (GLOVE) ×6
GLOVE SURG SS PI 7.0 STRL IVOR (GLOVE) ×9 IMPLANT
GOWN STRL REUS W/ TWL LRG LVL3 (GOWN DISPOSABLE) ×1 IMPLANT
GOWN STRL REUS W/ TWL XL LVL3 (GOWN DISPOSABLE) ×3 IMPLANT
GOWN STRL REUS W/TWL 2XL LVL3 (GOWN DISPOSABLE) IMPLANT
GOWN STRL REUS W/TWL LRG LVL3 (GOWN DISPOSABLE) ×2
GOWN STRL REUS W/TWL XL LVL3 (GOWN DISPOSABLE) ×6
HEMOSTAT POWDER KIT SURGIFOAM (HEMOSTASIS) ×3 IMPLANT
KIT BASIN OR (CUSTOM PROCEDURE TRAY) ×3 IMPLANT
KIT TURNOVER KIT B (KITS) ×3 IMPLANT
NEEDLE HYPO 25X1 1.5 SAFETY (NEEDLE) ×3 IMPLANT
NEEDLE SPNL 20GX3.5 QUINCKE YW (NEEDLE) ×3 IMPLANT
NS IRRIG 1000ML POUR BTL (IV SOLUTION) ×3 IMPLANT
OIL CARTRIDGE MAESTRO DRILL (MISCELLANEOUS) ×3
PACK LAMINECTOMY NEURO (CUSTOM PROCEDURE TRAY) ×3 IMPLANT
PAD ARMBOARD 7.5X6 YLW CONV (MISCELLANEOUS) ×9 IMPLANT
RUBBERBAND STERILE (MISCELLANEOUS) IMPLANT
SPONGE SURGIFOAM ABS GEL SZ50 (HEMOSTASIS) IMPLANT
STRIP CLOSURE SKIN 1/2X4 (GAUZE/BANDAGES/DRESSINGS) ×2 IMPLANT
SUT VIC AB 0 CT1 18XCR BRD8 (SUTURE) ×1 IMPLANT
SUT VIC AB 0 CT1 8-18 (SUTURE) ×2
SUT VIC AB 2-0 CP2 18 (SUTURE) ×3 IMPLANT
SUT VIC AB 3-0 SH 8-18 (SUTURE) ×3 IMPLANT
TOWEL GREEN STERILE (TOWEL DISPOSABLE) ×3 IMPLANT
TOWEL GREEN STERILE FF (TOWEL DISPOSABLE) ×3 IMPLANT
WATER STERILE IRR 1000ML POUR (IV SOLUTION) ×3 IMPLANT

## 2017-10-15 NOTE — H&P (Signed)
Subjective: Patient is a 57 y.o. female admitted for difficulty with gait and back and leg pain. Onset of symptoms was several months ago, gradually worsening since that time.  The pain is rated severe, and is located at the across the lower back and radiates to legs. The pain is described as aching and occurs intermittently. The symptoms have been progressive. Symptoms are exacerbated by exercise. MRI or CT showed spinal stenosis L1-L3   Past Medical History:  Diagnosis Date  . Acute renal failure (ARF) ([place]) 01/2012  . Anxiety   . Arthritis   . Breast cancer in female [facility])    Right  . Cancer ([place])   . Dyspnea   . Dysrhythmia 01/2017   atrial fibrillation  . Heart murmur    per pt report  . History of kidney stones   . History of MRSA infection 2013   leg, abdomen, arm  . Hyperlipidemia 2018  . Hypertension   . Kidney stone 01/2017  . Lumbar stenosis   . Obesity 01/2017  . Pyelonephritis   . UTI (urinary tract infection)     Past Surgical History:  Procedure Laterality Date  . BREAST BIOPSY Right 07/16/2017   pending path  . BREAST LUMPECTOMY WITH RADIOACTIVE SEED AND SENTINEL LYMPH NODE BIOPSY Right 09/01/2017   Procedure: BREAST LUMPECTOMY WITH RADIOACTIVE SEED AND SENTINEL LYMPH NODE BIOPSY;  Surgeon: Alphonsa Overall, MD;  Location: [place];  Service: General;  Laterality: Right;  . CHOLECYSTECTOMY     1992  . DILATION AND CURETTAGE OF UTERUS    . EXTRACORPOREAL SHOCK WAVE LITHOTRIPSY Left 02/13/2017   Procedure: EXTRACORPOREAL SHOCK WAVE LITHOTRIPSY (ESWL);  Surgeon: Royston Cowper, MD;  Location: [facility];  Service: Urology;  Laterality: Left;  . FOOT FRACTURE SURGERY Right 2018   repair of trigonem of ankle. endoscopic fasciotomy  . KNEE ARTHROSCOPY Right 2013   meniscus  . KNEE SURGERY Right    knee scope for torn meniscus    Prior to Admission medications   Medication Sig Start Date End Date Taking? Authorizing Provider  amiodarone (PACERONE) 200 MG tablet Take  200 mg by mouth 2 (two) times a week. Sunday and Wednesday   Yes Neoma Laming A, MD  Cholecalciferol (VITAMIN D3) 50000 units CAPS Take 50,000 Units by mouth every Sunday.  06/27/17  Yes [provider]  citalopram (CELEXA) 40 MG tablet Take 40 mg by mouth daily. 07/02/17  Yes [provider]  furosemide (LASIX) 20 MG tablet Take 20 mg by mouth daily. 08/19/17  Yes [provider]  lisinopril-hydrochlorothiazide (PRINZIDE,ZESTORETIC) 10-12.5 MG tablet Take 1 tablet by mouth daily. 08/19/17  Yes [provider]  XARELTO 20 MG TABS tablet Take 20 mg by mouth daily. 07/15/17  Yes [provider]  Carboxymethylcellul-Glycerin (LUBRICATING EYE DROPS OP) Place 1 drop into both eyes daily as needed (dry eyes).    [provider]  HYDROcodone-acetaminophen (NORCO/VICODIN) 5-325 MG tablet Take 1 tablet by mouth every 6 (six) hours as needed for moderate pain. Patient not taking: Reported on 09/22/2017 09/01/17   Alphonsa Overall, MD   No Known Allergies  Social History   Tobacco Use  . Smoking status: Never Smoker  . Smokeless tobacco: Never Used  Substance Use Topics  . Alcohol use: No    Family History  Problem Relation Age of Onset  . Heart disease Father        deceased 67; had 34 siblings  . Hypertension Father   . Colon cancer  Paternal Uncle 63       deceased 61s  . Hypertension Paternal Uncle   . Diabetes Maternal Aunt   . Hypertension Maternal Aunt   . Heart disease Mother        deceased at 55; had 7 siblings  . Hypertension Mother   . Uterine cancer Sister 33       currently 68; no children  . Breast cancer Cousin 28       currently 78; daughter of unaffected maternal aunt     Review of Systems  Positive ROS: negative  All other systems have been reviewed and were otherwise negative with the exception of those mentioned in the HPI and as above.  Objective: Vital signs in last 24 hours: Temp:  [97.5 F (36.4 C)] 97.5 F (36.4  C) (07/31 1114) Pulse Rate:  [50] 50 (07/31 1114) Resp:  [20] 20 (07/31 1114) BP: (149)/(65) 149/65 (07/31 1114) SpO2:  [95 %] 95 % (07/31 1114)  General Appearance: Alert, cooperative, no distress, appears stated age Head: Normocephalic, without obvious abnormality, atraumatic Eyes: PERRL, conjunctiva/corneas clear, EOM's intact    Neck: Supple, symmetrical, trachea midline Back: Symmetric, no curvature, ROM normal, no CVA tenderness Lungs:  respirations unlabored Heart: Regular rate and rhythm Abdomen: Soft, non-tender Extremities: Extremities normal, atraumatic, no cyanosis or edema Pulses: 2+ and symmetric all extremities Skin: Skin color, texture, turgor normal, no rashes or lesions  NEUROLOGIC:   Mental status: Alert and oriented x4,  no aphasia, good attention span, fund of knowledge, and memory Motor Exam - grossly normal Sensory Exam - grossly normal Reflexes: trace Coordination - grossly normal Gait - antalgic Balance - grossly normal Cranial Nerves: I: smell Not tested  II: visual acuity  OS: nl    OD: nl  II: visual fields Full to confrontation  II: pupils Equal, round, reactive to light  III,VII: ptosis None  III,IV,VI: extraocular muscles  Full ROM  V: mastication Normal  V: facial light touch sensation  Normal  V,VII: corneal reflex  Present  VII: facial muscle function - upper  Normal  VII: facial muscle function - lower Normal  VIII: hearing Not tested  IX: soft palate elevation  Normal  IX,X: gag reflex Present  XI: trapezius strength  5/5  XI: sternocleidomastoid strength 5/5  XI: neck flexion strength  5/5  XII: tongue strength  Normal    Data Review Lab Results  Component Value Date   WBC 7.7 10/13/2017   HGB 12.4 10/13/2017   HCT 38.1 10/13/2017   MCV 92.9 10/13/2017   PLT 262 10/13/2017   Lab Results  Component Value Date   NA 142 10/13/2017   K 3.8 10/13/2017   CL 103 10/13/2017   CO2 29 10/13/2017   BUN 17 10/13/2017    CREATININE 1.24 (H) 10/13/2017   GLUCOSE 99 10/13/2017   Lab Results  Component Value Date   INR 1.61 10/13/2017    Assessment/Plan:  Estimated body mass index is 52.06 kg/m as calculated from the following:   Height as of 10/13/17: 5\' 4"  (1.626 m).   Weight as of 10/13/17: 137.6 kg (303 lb 4.8 oz). morbid obesity Patient admitted for compressive lumbar laminectomy for spinal stenosis L1-L3. Patient has failed a reasonable attempt at conservative therapy.  I explained the condition and procedure to the patient and answered any questions.  Patient wishes to proceed with procedure as planned. Understands risks/ benefits and typical outcomes of procedure.   JONES,DAVID S 10/15/2017 11:43 AM

## 2017-10-15 NOTE — Op Note (Signed)
10/15/2017  2:57 PM  PATIENT:  Heather Conrad  57 y.o. female  PRE-OPERATIVE DIAGNOSIS:  Severe spinal stenosis L1-3, gait instability  POST-OPERATIVE DIAGNOSIS:  same  PROCEDURE:  Decompressive lumbar laminectomy, medial facetectomy and foraminotomy L1-2 L2-3 L3-4  SURGEON:  Sherley Bounds, MD  ASSISTANTS: Meyran FNP  ANESTHESIA:   General  EBL: 250 ml  Total I/O In: -  Out: 250 [Blood:250]  BLOOD ADMINISTERED: none  DRAINS: med hemovac  SPECIMEN:  none  INDICATION FOR PROCEDURE: This patient presented with gait instability and back and leg pain. Imaging showed severe stenosis. The patient tried conservative measures without relief. Pain was debilitating. Recommended DLL l1-3. Patient understood the risks, benefits, and alternatives and potential outcomes and wished to proceed.  PROCEDURE DETAILS: The patient was taken to the operating room and after induction of adequate generalized endotracheal anesthesia, the patient was rolled into the prone position on the Schoonmaker frame and all pressure points were padded. The lumbar region was cleaned and then prepped with DuraPrep and draped in the usual sterile fashion. 5 cc of local anesthesia was injected and then a dorsal midline incision was made and carried down to the lumbo sacral fascia. The fascia was opened and the paraspinous musculature was taken down in a subperiosteal fashion to expose L1-4 bilitaerally. Intraoperative x-ray confirmed my level, and then I removed the spinous processes L1-3 and used a combination of the high-speed drill and the Kerrison punches to perform a  Full laminectomy, medial facetectomy, and foraminotomy at L1-2 L2-3 L3-4 Bilaterally. The underlying yellow ligament was opened and removed in a piecemeal fashion to expose the underlying dura and exiting nerve root at each level. She had very severe stenosis, esp at L3-4. I undercut the lateral recess and dissected down until I was medial to and distal to the  pedicle at each level. The nerve root was well decompressed.  I then palpated with a coronary dilator along the nerve root and into the foramen to assure adequate decompression. I felt no more compression of the nerve root. I irrigated with saline solution containing bacitracin. Achieved hemostasis with bipolar cautery, lined the dura with Gelfoam, placed a med hemovac drain and then closed the fascia with 0 Vicryl. I closed the subcutaneous tissues with 2-0 Vicryl and the subcuticular tissues with 3-0 Vicryl. The skin was then closed with benzoin and Steri-Strips. The drapes were removed, a sterile dressing was applied. The patient was awakened from general anesthesia and transferred to the recovery room in stable condition. At the end of the procedure all sponge, needle and instrument counts were correct.    PLAN OF CARE: Admit to inpatient   PATIENT DISPOSITION:  PACU - hemodynamically stable.   Delay start of Pharmacological VTE agent (>24hrs) due to surgical blood loss or risk of bleeding:  yes

## 2017-10-15 NOTE — H&P (Deleted)
10/15/2017  11:41 AM  PATIENT:  Heather Conrad  57 y.o. female  PRE-OPERATIVE DIAGNOSIS:  Cervical spondylosis with cervical disc herniation C4-5 and cervical stenosis with foraminal stenosis C5-6, neck and arm pain  POST-OPERATIVE DIAGNOSIS:  same  PROCEDURE:  1. Decompressive anterior cervical discectomy C4-5 and C5-6, 2. Anterior cervical arthrodesis C4-5 C5-6 utilizing a porous titanium interbody cage packed with locally harvested morcellized autologous bone graft, 3. Anterior cervical plating C4-C6 utilizing a Alphatec plate  SURGEON:  Sherley Bounds, MD  ASSISTANTS: Glenford Peers FNP  ANESTHESIA:   General  EBL: less than 50 ml  No intake/output data recorded.  BLOOD ADMINISTERED: none  DRAINS: none  SPECIMEN:  none  INDICATION FOR PROCEDURE: This patient presented with neck and arm pain left greater than right. Imaging showed cervical stenosis. The patient tried conservative measures without relief. Pain was debilitating. Recommended ACDF with plating. Patient understood the risks, benefits, and alternatives and potential outcomes and wished to proceed.  PROCEDURE DETAILS: Patient was brought to the operating room placed under general endotracheal anesthesia. Patient was placed in the supine position on the operating room table. The neck was prepped with Duraprep and draped in a sterile fashion.   Three cc of local anesthesia was injected and a transverse incision was made on the right side of the neck.  Dissection was carried down thru the subcutaneous tissue and the platysma was  elevated, opened, and undermined with Metzenbaum scissors.  Dissection was then carried out thru an avascular plane leaving the sternocleidomastoid carotid artery and jugular vein laterally and the trachea and esophagus medially. The ventral aspect of the vertebral column was identified and a localizing x-ray was taken. The C4-5 level was identified. The longus colli muscles were then elevated and the  retractor was placed. The annulus at C4-5 and C5-6was incised and the disc space entered. Discectomy was performed the same at both levels andwith micro-curettes and pituitary rongeurs. I then used the high-speed drill to drill the endplates down to the level of the posterior longitudinal ligament. The drill shavings were saved in a mucous trap for later arthrodesis. The operating microscope was draped and brought into the field provided additional magnification, illumination and visualization. Discectomy was continued posteriorly thru the disc space. Posterior longitudinal ligament was opened with a nerve hook, and then removed along with disc herniation and osteophytes, decompressing the spinal canal and thecal sac. We then continued to remove osteophytic overgrowth and disc material decompressing the neural foramina and exiting nerve roots bilaterally at both levels. The scope was angled up and down to help decompress and undercut the vertebral bodies. Once the decompression was completed at both levels we could pass a nerve hook circumferentially to assure adequate decompression in the midline and in the neural foramina. So by both visualization and palpation we felt we had an adequate decompression of the neural elements. We then measured the height of the intravertebral disc space and selected a 7 millimeter porous titanium interbody cage packed with autograft. It was then gently positioned in the intravertebral disc space(s) and countersunk. I then used a Alphatec 30 mm plate and placed variable angle screws into the vertebral bodies of each level and locked them into position. The wound was irrigated with bacitracin solution, checked for hemostasis which was established and confirmed. Once meticulous hemostasis was achieved, we then proceeded with closure. The platysma was closed with interrupted 3-0 undyed Vicryl suture, the subcuticular layer was closed with interrupted 3-0 undyed Vicryl suture. The skin  edges were approximated with steristrips. The drapes were removed. A sterile dressing was applied. The patient was then awakened from general anesthesia and transferred to the recovery room in stable condition. At the end of the procedure all sponge, needle and instrument counts were correct.   PLAN OF CARE: Admit to inpatient   PATIENT DISPOSITION:  PACU - hemodynamically stable.   Delay start of Pharmacological VTE agent (>24hrs) due to surgical blood loss or risk of bleeding:  yes

## 2017-10-15 NOTE — Anesthesia Procedure Notes (Signed)
Procedure Name: Intubation Date/Time: 10/15/2017 12:26 PM Performed by: Colin Benton, CRNA Pre-anesthesia Checklist: Patient identified, Emergency Drugs available, Suction available and Patient being monitored Patient Re-evaluated:Patient Re-evaluated prior to induction Oxygen Delivery Method: Circle system utilized Preoxygenation: Pre-oxygenation with 100% oxygen Induction Type: IV induction Ventilation: Mask ventilation without difficulty Laryngoscope Size: Miller and 2 Grade View: Grade I Tube type: Oral Tube size: 7.0 mm Number of attempts: 1 Airway Equipment and Method: Stylet Placement Confirmation: ETT inserted through vocal cords under direct vision,  positive ETCO2 and breath sounds checked- equal and bilateral Secured at: 23 cm Tube secured with: Tape Dental Injury: Teeth and Oropharynx as per pre-operative assessment

## 2017-10-15 NOTE — Transfer of Care (Signed)
Immediate Anesthesia Transfer of Care Note  Patient: KATALEYAH CARDUCCI  Procedure(s) Performed: Laminectomy and Foraminotomy - Lumbar one-two, Lumbar two-three, Lumbar three-four  - bilateral (Bilateral Back)  Patient Location: PACU  Anesthesia Type:General  Level of Consciousness: awake, alert , oriented and patient cooperative  Airway & Oxygen Therapy: Patient Spontanous Breathing and Patient connected to nasal cannula oxygen  Post-op Assessment: Report given to RN, Post -op Vital signs reviewed and stable and Patient moving all extremities X 4  Post vital signs: Reviewed and stable  Last Vitals:  Vitals Value Taken Time  BP    Temp    Pulse 71 10/15/2017  3:11 PM  Resp 13 10/15/2017  3:11 PM  SpO2 96 % 10/15/2017  3:11 PM  Vitals shown include unvalidated device data.  Last Pain:  Vitals:   10/15/17 1131  TempSrc:   PainSc: 0-No pain      Patients Stated Pain Goal: 3 (76/54/65 0354)  Complications: No apparent anesthesia complications

## 2017-10-15 NOTE — Anesthesia Preprocedure Evaluation (Signed)
Anesthesia Evaluation  Patient identified by MRN, date of birth, ID band Patient awake    Reviewed: Allergy & Precautions, NPO status , Patient's Chart, lab work & pertinent test results  Airway Mallampati: III  TM Distance: >3 FB Neck ROM: Full    Dental  (+) Edentulous Upper, Edentulous Lower   Pulmonary    breath sounds clear to auscultation       Cardiovascular hypertension,  Rhythm:Irregular Rate:Normal     Neuro/Psych    GI/Hepatic   Endo/Other    Renal/GU      Musculoskeletal   Abdominal (+) + obese,   Peds  Hematology   Anesthesia Other Findings   Reproductive/Obstetrics                             Anesthesia Physical Anesthesia Plan  ASA: III  Anesthesia Plan: General   Post-op Pain Management:    Induction: Intravenous  PONV Risk Score and Plan: Ondansetron and Dexamethasone  Airway Management Planned: Oral ETT  Additional Equipment:   Intra-op Plan:   Post-operative Plan: Extubation in OR  Informed Consent: I have reviewed the patients History and Physical, chart, labs and discussed the procedure including the risks, benefits and alternatives for the proposed anesthesia with the patient or authorized representative who has indicated his/her understanding and acceptance.     Plan Discussed with: CRNA and Anesthesiologist  Anesthesia Plan Comments:         Anesthesia Quick Evaluation

## 2017-10-15 NOTE — Anesthesia Postprocedure Evaluation (Signed)
Anesthesia Post Note  Patient: Heather Conrad  Procedure(s) Performed: Laminectomy and Foraminotomy - Lumbar one-two, Lumbar two-three, Lumbar three-four  - bilateral (Bilateral Back)     Patient location during evaluation: PACU Anesthesia Type: General Level of consciousness: awake and alert Pain management: pain level controlled Vital Signs Assessment: post-procedure vital signs reviewed and stable Respiratory status: spontaneous breathing, nonlabored ventilation and respiratory function stable Cardiovascular status: blood pressure returned to baseline and stable Postop Assessment: no apparent nausea or vomiting Anesthetic complications: no    Last Vitals:  Vitals:   10/15/17 1114 10/15/17 1515  BP: (!) 149/65   Pulse: (!) 50 66  Resp: 20 13  Temp: (!) 36.4 C 36.4 C  SpO2: 95% 96%    Last Pain:  Vitals:   10/15/17 1515  TempSrc:   PainSc: 10-Worst pain ever                 Catalina Gravel

## 2017-10-15 NOTE — Progress Notes (Signed)
Pt admitted to 3W11 from PACU.  Alert and oriented.  Honeycomb dressing CDI. Hemovac charged. Daughter at bedside.  Bed in lowest position, call bell within reach, and pt verbalizes understanding to call before attempting to get OOB.  Pain 7/10 at present.  Medicated in PACU before transport to floor.  Will continue to monitor.

## 2017-10-16 ENCOUNTER — Encounter (HOSPITAL_COMMUNITY): Payer: Self-pay | Admitting: Neurological Surgery

## 2017-10-16 DIAGNOSIS — M48061 Spinal stenosis, lumbar region without neurogenic claudication: Secondary | ICD-10-CM | POA: Diagnosis not present

## 2017-10-16 DIAGNOSIS — Z6841 Body Mass Index (BMI) 40.0 and over, adult: Secondary | ICD-10-CM | POA: Diagnosis not present

## 2017-10-16 DIAGNOSIS — I10 Essential (primary) hypertension: Secondary | ICD-10-CM | POA: Diagnosis not present

## 2017-10-16 DIAGNOSIS — R269 Unspecified abnormalities of gait and mobility: Secondary | ICD-10-CM | POA: Diagnosis not present

## 2017-10-16 DIAGNOSIS — Z8249 Family history of ischemic heart disease and other diseases of the circulatory system: Secondary | ICD-10-CM | POA: Diagnosis not present

## 2017-10-16 DIAGNOSIS — Z7901 Long term (current) use of anticoagulants: Secondary | ICD-10-CM | POA: Diagnosis not present

## 2017-10-16 NOTE — Progress Notes (Signed)
Subjective: Patient reports appropriate back soreness, no leg pain, has only been to bedside cammode, denies NT  Objective: Vital signs in last 24 hours: Temp:  [97.5 F (36.4 C)-98.3 F (36.8 C)] 97.7 F (36.5 C) (08/01 0411) Pulse Rate:  [50-73] 59 (08/01 0411) Resp:  [7-23] 18 (08/01 0411) BP: (118-149)/(41-67) 118/58 (08/01 0411) SpO2:  [90 %-99 %] 90 % (08/01 0411)  Intake/Output from previous day: 07/31 0701 - 08/01 0700 In: 1179.7 [P.O.:120; I.V.:709.7; IV Piggyback:250] Out: 300 [Drains:50; Blood:250] Intake/Output this shift: No intake/output data recorded.  Neurologic: Grossly normal to in bed exam  Lab Results: Lab Results  Component Value Date   WBC 7.7 10/13/2017   HGB 12.4 10/13/2017   HCT 38.1 10/13/2017   MCV 92.9 10/13/2017   PLT 262 10/13/2017   Lab Results  Component Value Date   INR 1.01 10/15/2017   BMET Lab Results  Component Value Date   NA 142 10/13/2017   K 3.8 10/13/2017   CL 103 10/13/2017   CO2 29 10/13/2017   GLUCOSE 99 10/13/2017   BUN 17 10/13/2017   CREATININE 1.24 (H) 10/13/2017   CALCIUM 9.3 10/13/2017    Studies/Results: Dg Lumbar Spine 2-3 Views  Result Date: 10/15/2017 CLINICAL DATA:  Localization radiographs prior to laminectomy and foraminotomy at L1-2, L2-3, and L3-4. EXAM: LUMBAR SPINE - 2-3 VIEW COMPARISON:  MRI of the lumbar spine of August 16, 2017. FINDINGS: The initial image timed at 1245 hours reveals a metallic needle which projects approximately 5 cm posterior to the posterior margin of the L2-3 disc space. The second image timed at 1259 hours of today's date reveals the metallic trocar to project approximately 3.2 cm posterior to the posterior margin of the L2-3 disc. A tissue spreader device is present. IMPRESSION: Lateral localization cross-table radiographs of the lumbar spine with findings as described. Electronically Signed   By: Sarie Stall  [place] M.D.   On: 10/15/2017 13:26    Assessment/Plan: Mobilize  today  Estimated body mass index is 52.06 kg/m as calculated from the following:   Height as of 10/13/17: 5\' 4"  (1.626 m).   Weight as of 10/13/17: 137.6 kg (303 lb 4.8 oz).    LOS: 1 day    Moana Munford S 10/16/2017, 7:41 AM

## 2017-10-16 NOTE — Progress Notes (Signed)
Patient out of bed to the bedside commode three times through out the shift

## 2017-10-16 NOTE — Evaluation (Signed)
Occupational Therapy Evaluation Patient Details Name: Heather Conrad MRN: [id number] DOB: April 15, 1960 Today's Date: 10/16/2017    History of Present Illness pt s/p Laminectomy and Foraminotomy - Lumbar one-two, Lumbar two-three, Lumbar three-four  - bilateral on 10/15/17   Clinical Impression   This 57 y/o female presents with the above. At baseline pt reports using RW for functional mobility, and reports was completing ADLs independently. Pt demonstrating functioanl mobility this session using RW with minguard-minA. She currently requires modA for LB and toileting ADLs secondary to adhering to back precautions. Educated pt on back precautions, AE, safety and compensatory strategies for completing ADLs after return home while maintaining precautions. Pt will benefit from continued OT services while in acute setting to further review use of AE for LB ADLs and to maximize her overall safety and independence with ADLs and mobility. Pt reports she will only have intermittent assist from family after discharge home. Recommend follow up [place] services at time of discharge to further progress pt towards PLOF. Will follow.     Follow Up Recommendations  Home health OT;Supervision/Assistance - 24 hour    Equipment Recommendations  3 in 1 bedside commode;Other (comment)(will need bariatric 3:1 )           Precautions / Restrictions Precautions Precautions: Back Precaution Booklet Issued: Yes (comment) Precaution Comments: educated pt on back precautions, no brace required Restrictions Weight Bearing Restrictions: No      Mobility Bed Mobility Overal bed mobility: Needs Assistance Bed Mobility: Supine to Sit     Supine to sit: Supervision     General bed mobility comments: OOB upon arrival   Transfers Overall transfer level: Needs assistance Equipment used: Rolling walker (2 wheeled) Transfers: Sit to/from Stand Sit to Stand: Min guard Stand pivot transfers: Min guard       General  transfer comment: cues for UE placement, min guard for balance upon initial standing    Balance Overall balance assessment: Needs assistance           Standing balance-Leahy Scale: Fair Standing balance comment: requires RW for balance during mobility                            ADL either performed or assessed with clinical judgement   ADL Overall ADL's : Needs assistance/impaired Eating/Feeding: Independent;Sitting   Grooming: Min guard;Standing;Oral care;Wash/dry face;Wash/dry Nurse, mental health Details (indicate cue type and reason): pt demonstrating good use of compensatory strategies  Upper Body Bathing: Min guard;Sitting   Lower Body Bathing: Moderate assistance;Sit to/from stand   Upper Body Dressing : Min guard;Sitting   Lower Body Dressing: Moderate assistance;Sit to/from stand   Toilet Transfer: Min guard;Minimal assistance;Ambulation;Regular Toilet;Grab bars;RW   Toileting- Clothing Manipulation and Hygiene: Minimal assistance;Sit to/from stand Toileting - Clothing Manipulation Details (indicate cue type and reason): assist for gown management; educated on use of toilet aide for use at home as pt reports having difficulty with peri-care prior to surgery    Tub/Shower Transfer Details (indicate cue type and reason): educated on use of 3:1 as shower seat during task completion  Functional mobility during ADLs: Min guard;Minimal assistance;Rolling walker General ADL Comments: educated no back precautions, AE, safety and compensatory strategies for completing ADLs while adhering to precautions; verbally reviewed how to use AE, pt will benefit from further practice/review      Vision         Perception     Praxis      Pertinent  Vitals/Pain Pain Assessment: 0-10 Pain Score: 6  Faces Pain Scale: Hurts little more Pain Location: back Pain Descriptors / Indicators: Aching;Grimacing;Guarding Pain Intervention(s): Monitored during session;Repositioned      Hand Dominance     Extremity/Trunk Assessment Upper Extremity Assessment Upper Extremity Assessment: Overall WFL for tasks assessed   Lower Extremity Assessment Lower Extremity Assessment: Defer to PT evaluation   Cervical / Trunk Assessment Cervical / Trunk Assessment: Other exceptions Cervical / Trunk Exceptions: s/p lumbar surgery    Communication Communication Communication: No difficulties   Cognition Arousal/Alertness: Awake/alert Behavior During Therapy: WFL for tasks assessed/performed Overall Cognitive Status: Within Functional Limits for tasks assessed                                     General Comments       Exercises     Shoulder Instructions      Home Living Family/patient expects to be discharged to:: Private residence Living Arrangements: Children Available Help at Discharge: Available PRN/intermittently;Family Type of Home: House Home Access: Stairs to enter Technical brewer of Steps: 1 Entrance Stairs-Rails: None Home Layout: One level               Home Equipment: Walker - 2 wheels          Prior Functioning/Environment Level of Independence: Independent with assistive device(s)        Comments: has been using RW x 6 months due to pain        OT Problem List: Decreased strength;Impaired balance (sitting and/or standing);Decreased knowledge of precautions;Pain;Decreased activity tolerance;Decreased knowledge of use of DME or AE      OT Treatment/Interventions: Self-care/ADL training;DME and/or AE instruction;Therapeutic activities;Balance training;Therapeutic exercise;Patient/family education    OT Goals(Current goals can be found in the care plan section) Acute Rehab OT Goals Patient Stated Goal: go home soon OT Goal Formulation: With patient Time For Goal Achievement: 10/30/17 Potential to Achieve Goals: Good  OT Frequency: Min 2X/week   Barriers to D/C:            Co-evaluation               AM-PAC PT "6 Clicks" Daily Activity     Outcome Measure Help from another person eating meals?: None Help from another person taking care of personal grooming?: A Little Help from another person toileting, which includes using toliet, bedpan, or urinal?: A Little Help from another person bathing (including washing, rinsing, drying)?: A Lot Help from another person to put on and taking off regular upper body clothing?: None Help from another person to put on and taking off regular lower body clothing?: A Lot 6 Click Score: 18   End of Session Equipment Utilized During Treatment: Gait belt;Rolling walker Nurse Communication: Mobility status  Activity Tolerance: Patient tolerated treatment well Patient left: in chair;with call bell/phone within reach  OT Visit Diagnosis: Other abnormalities of gait and mobility (R26.89);Pain Pain - part of body: (back )                Time: 5885-0277 OT Time Calculation (min): 36 min Charges:  OT General Charges $OT Visit: 1 Visit OT Evaluation $OT Eval Moderate Complexity: 1 Mod OT Treatments $Self Care/Home Management : 8-22 mins  Lou Cal, OT Pager [phone] 10/16/2017   [name] 10/16/2017, 11:44 AM

## 2017-10-16 NOTE — Evaluation (Signed)
Physical Therapy Evaluation Patient Details Name: Heather Conrad MRN: [id number] DOB: 26-May-1960 Today's Date: 10/16/2017   History of Present Illness  pt s/p Laminectomy and Foraminotomy - Lumbar one-two, Lumbar two-three, Lumbar three-four  - bilateral on 10/15/17  Clinical Impression  Pt presents with back pain, decreased strength and mobility and will continue to benefit from skilled PT services to address deficits and improve functional independence. Pt was educated on energy conservation and home set up techniques for safety at d/c.    Follow Up Recommendations Home health PT    Equipment Recommendations  None recommended by PT(pt owns RW)    Recommendations for Other Services       Precautions / Restrictions Precautions Precautions: Back Precaution Comments: educated pt on back precautions, no brace required Restrictions Weight Bearing Restrictions: No      Mobility  Bed Mobility Overal bed mobility: Needs Assistance Bed Mobility: Supine to Sit     Supine to sit: Supervision     General bed mobility comments: cues for log roll, use of bed rails  Transfers Overall transfer level: Needs assistance Equipment used: Rolling walker (2 wheeled) Transfers: Sit to/from [employer] Transfers Sit to Stand: Min guard Stand pivot transfers: Min guard       General transfer comment: cues for UE placement, min guard for balance upon initial standing  Ambulation/Gait Ambulation/Gait assistance: Min guard Gait Distance (Feet): 80 Feet Assistive device: Rolling walker (2 wheeled)       General Gait Details: pt with decreased cadence and guarded steps, cues for breathing, min guard/min A with turns, cues for RW use when sidestepping in tight spaces  Stairs            Wheelchair Mobility    Modified Rankin (Stroke Patients Only)       Balance Overall balance assessment: Needs assistance           Standing balance-Leahy Scale: Fair Standing  balance comment: requires RW for balance                             Pertinent Vitals/Pain Pain Assessment: Faces Faces Pain Scale: Hurts little more Pain Location: back Pain Descriptors / Indicators: Aching Pain Intervention(s): Limited activity within patient's tolerance;Monitored during session    Home Living Family/patient expects to be discharged to:: Private residence Living Arrangements: Children Available Help at Discharge: Available PRN/intermittently;Family Type of Home: House Home Access: Stairs to enter Entrance Stairs-Rails: None Entrance Stairs-Number of Steps: 1 Home Layout: One level Home Equipment: Walker - 2 wheels      Prior Function Level of Independence: Independent with assistive device(s)         Comments: has been using RW x 6 months due to pain     Hand Dominance        Extremity/Trunk Assessment   Upper Extremity Assessment Upper Extremity Assessment: Generalized weakness    Lower Extremity Assessment Lower Extremity Assessment: Generalized weakness    Cervical / Trunk Assessment Cervical / Trunk Assessment: (lumbar surgery)  Communication   Communication: No difficulties  Cognition Arousal/Alertness: Awake/alert Behavior During Therapy: WFL for tasks assessed/performed Overall Cognitive Status: Within Functional Limits for tasks assessed                                        General Comments      Exercises  Assessment/Plan    PT Assessment Patient needs continued PT services  PT Problem List Decreased activity tolerance;Decreased knowledge of use of DME;Decreased strength;Decreased mobility;Pain;Decreased balance       PT Treatment Interventions DME instruction;Therapeutic activities;Modalities;Gait training;Therapeutic exercise;Patient/family education;Stair training;Balance training;Functional mobility training;Neuromuscular re-education    PT Goals (Current goals can be found in the  Care Plan section)  Acute Rehab PT Goals Patient Stated Goal: go home soon PT Goal Formulation: With patient Time For Goal Achievement: 10/30/17 Potential to Achieve Goals: Good    Frequency Min 5X/week   Barriers to discharge Decreased caregiver support pt will be alone while daughter is working    Co-evaluation               AM-PAC PT "6 Clicks" Daily Activity  Outcome Measure Difficulty turning over in bed (including adjusting bedclothes, sheets and blankets)?: A Little Difficulty moving from lying on back to sitting on the side of the bed? : A Little Difficulty sitting down on and standing up from a chair with arms (e.g., wheelchair, bedside commode, etc,.)?: A Little Help needed moving to and from a bed to chair (including a wheelchair)?: A Little Help needed walking in hospital room?: A Little Help needed climbing 3-5 steps with a railing? : A Lot 6 Click Score: 17    End of Session Equipment Utilized During Treatment: Gait belt Activity Tolerance: Patient tolerated treatment well Patient left: in chair;with call bell/phone within reach Nurse Communication: Mobility status PT Visit Diagnosis: Muscle weakness (generalized) (M62.81);Other abnormalities of gait and mobility (R26.89);Pain Pain - part of body: (back)    Time: 2575-0518 PT Time Calculation (min) (ACUTE ONLY): 20 min   Charges:   PT Evaluation $PT Eval Moderate Complexity: 1 Mod PT Treatments $Gait Training: 8-22 mins        Isabelle Course, PT, DPT  Heather Conrad 10/16/2017, 9:58 AM

## 2017-10-17 ENCOUNTER — Other Ambulatory Visit: Payer: Self-pay

## 2017-10-17 MED ORDER — METHOCARBAMOL 500 MG PO TABS: 500.0000 mg | ORAL_TABLET | Freq: Four times a day (QID) | ORAL | 0 refills | Status: DC | PRN

## 2017-10-17 MED ORDER — MANAGING BACK PAIN BOOK
Freq: Once | Status: DC
  Filled 2017-10-17: qty 1

## 2017-10-17 MED ORDER — HYDROCODONE-ACETAMINOPHEN 7.5-325 MG PO TABS: 1.0000 | ORAL_TABLET | Freq: Four times a day (QID) | ORAL | 0 refills | Status: DC

## 2017-10-17 NOTE — Discharge Summary (Signed)
Physician Discharge Summary  Patient ID: Heather Conrad MRN: [id number] DOB/AGE: 1960-09-07 57 y.o.  Admit date: 10/15/2017 Discharge date: 10/17/2017  Admission Diagnoses: Severe spinal stenosis L1-3, gait instability      Discharge Diagnoses: same   Discharged Condition: good  Hospital Course: The patient was admitted on 10/15/2017 and taken to the operating room where the patient underwent lumbar laminectomy L1-2,2-3,3-4. The patient tolerated the procedure well and was taken to the recovery room and then to the floor in stable condition. The hospital course was routine. There were no complications. The wound remained clean dry and intact. Pt had appropriate back soreness. No complaints of leg pain or new N/T/W. The patient remained afebrile with stable vital signs, and tolerated a regular diet. The patient continued to increase activities, and pain was well controlled with oral pain medications.   Consults: None  Significant Diagnostic Studies:  Results for orders placed or performed during the hospital encounter of 10/15/17  Protime-INR  Result Value Ref Range   Prothrombin Time 13.2 11.4 - 15.2 seconds   INR 1.01   Pregnancy, urine POC  Result Value Ref Range   Preg Test, Ur NEGATIVE NEGATIVE    Chest 2 View  Result Date: 10/13/2017 CLINICAL DATA:  Pre-admission study prior to lumbar fusion procedure. History of hypertension, nonsmoker, spinal stenosis. EXAM: CHEST - 2 VIEW COMPARISON:  None in PACs FINDINGS: The lungs are adequately inflated. There is no focal infiltrate. There is no pleural effusion. The heart is mildly enlarged. The pulmonary vascularity is normal. The mediastinum is normal in width. There is mild multilevel degenerative disc disease of the thoracic spine. IMPRESSION: There is no pneumonia nor other acute cardiopulmonary abnormality. Electronically Signed   By: David  Martinique M.D.   On: 10/13/2017 10:39   Dg Lumbar Spine 2-3 Views  Result Date:  10/15/2017 CLINICAL DATA:  Localization radiographs prior to laminectomy and foraminotomy at L1-2, L2-3, and L3-4. EXAM: LUMBAR SPINE - 2-3 VIEW COMPARISON:  MRI of the lumbar spine of August 16, 2017. FINDINGS: The initial image timed at 1245 hours reveals a metallic needle which projects approximately 5 cm posterior to the posterior margin of the L2-3 disc space. The second image timed at 1259 hours of today's date reveals the metallic trocar to project approximately 3.2 cm posterior to the posterior margin of the L2-3 disc. A tissue spreader device is present. IMPRESSION: Lateral localization cross-table radiographs of the lumbar spine with findings as described. Electronically Signed   By: David  Martinique M.D.   On: 10/15/2017 13:26    Antibiotics:  Anti-infectives (From admission, onward)   Start     Dose/Rate Route Frequency Ordered Stop   10/15/17 1830  ceFAZolin (ANCEF) IVPB 2g/100 mL premix     2 g 200 mL/hr over 30 Minutes Intravenous Every 8 hours 10/15/17 1818 10/16/17 0437   10/15/17 1333  bacitracin 50,000 Units in sodium chloride 0.9 % 500 mL irrigation  Status:  Discontinued       As needed 10/15/17 1334 10/15/17 1501   10/15/17 1000  ceFAZolin (ANCEF) 3 g in dextrose 5 % 50 mL IVPB     3 g 100 mL/hr over 30 Minutes Intravenous To Short Stay 10/14/17 1108 10/15/17 1233      Discharge Exam: Blood pressure (!) 110/53, pulse (!) 59, temperature (!) 97.4 F (36.3 C), temperature source Oral, resp. rate 20, height 5' 2.99" (1.6 m), weight (!) 137.6 kg (303 lb 5.7 oz), SpO2 95 %. Neurologic: Grossly normal  Ambulating and voiding well  Discharge Medications:   Allergies as of 10/17/2017   No Known Allergies     Medication List    STOP taking these medications   XARELTO 20 MG Tabs tablet Generic drug:  rivaroxaban     TAKE these medications   amiodarone 200 MG tablet Commonly known as:  PACERONE Take 200 mg by mouth 2 (two) times a week. Sunday and Wednesday   citalopram 40  MG tablet Commonly known as:  CELEXA Take 40 mg by mouth daily.   furosemide 20 MG tablet Commonly known as:  LASIX Take 20 mg by mouth daily.   HYDROcodone-acetaminophen 5-325 MG tablet Commonly known as:  NORCO/VICODIN Take 1 tablet by mouth every 6 (six) hours as needed for moderate pain. What changed:  Another medication with the same name was added. Make sure you understand how and when to take each.   HYDROcodone-acetaminophen 7.5-325 MG tablet Commonly known as:  NORCO Take 1 tablet by mouth every 6 (six) hours. What changed:  You were already taking a medication with the same name, and this prescription was added. Make sure you understand how and when to take each.   lisinopril-hydrochlorothiazide 10-12.5 MG tablet Commonly known as:  PRINZIDE,ZESTORETIC Take 1 tablet by mouth daily.   LUBRICATING EYE DROPS OP Place 1 drop into both eyes daily as needed (dry eyes).   methocarbamol 500 MG tablet Commonly known as:  ROBAXIN Take 1 tablet (500 mg total) by mouth every 6 (six) hours as needed for muscle spasms.   Vitamin D3 50000 units Caps Take 50,000 Units by mouth every Sunday.            Durable Medical Equipment  (From admission, onward)        Start     Ordered   10/17/17 1252  For home use only DME Gilford Rile  [facility])  Once    Question:  Patient needs a walker to treat with the following condition  Answer:  Spine pain   10/17/17 1253   10/15/17 1819  DME Walker rolling  Once    Question:  Patient needs a walker to treat with the following condition  Answer:  Gait instability   10/15/17 1818      Disposition: home   Final Dx: lumbar laminectomy  Discharge Instructions    Call MD for:  difficulty breathing, headache or visual disturbances   Complete by:  As directed    Call MD for:  hives   Complete by:  As directed    Call MD for:  persistant dizziness or light-headedness   Complete by:  As directed    Call MD for:  persistant nausea and vomiting    Complete by:  As directed    Call MD for:  redness, tenderness, or signs of infection (pain, swelling, redness, odor or green/yellow discharge around incision site)   Complete by:  As directed    Call MD for:  severe uncontrolled pain   Complete by:  As directed    Call MD for:  temperature >100.4   Complete by:  As directed    Diet - low sodium heart healthy   Complete by:  As directed    Face-to-face encounter (required for Medicare/Medicaid patients)   Complete by:  As directed    I Eleonore Chiquito certify that this patient is under my care and that I, or a nurse practitioner or physician's assistant working with me, had a face-to-face encounter that meets the  physician face-to-face encounter requirements with this patient on 10/17/2017. The encounter with the patient was in whole, or in part for the following medical condition(s) which is the primary reason for home health care (List medical condition): postoperative spine surgery   The encounter with the patient was in whole, or in part, for the following medical condition, which is the primary reason for home health care:  postop spine surgery   I certify that, based on my findings, the following services are medically necessary home health services:  Physical therapy   Reason for Medically Necessary Home Health Services:  Therapy- Personnel officer, Public librarian   My clinical findings support the need for the above services:  Unsafe ambulation due to balance issues   Further, I certify that my clinical findings support that this patient is homebound due to:  Unsafe ambulation due to balance issues   Home Health   Complete by:  As directed    To provide the following care/treatments:  PT   Increase activity slowly   Complete by:  As directed    Lifting restrictions   Complete by:  As directed    Nothing heavier than 8 lbs   Remove dressing in 48 hours   Complete by:  As directed           Signed: Ocie Cornfield Ediel Unangst 10/17/2017, 12:53 PM

## 2017-10-17 NOTE — Progress Notes (Signed)
Occupational Therapy Treatment Patient Details Name: Heather Conrad MRN: [id number] DOB: 01-Sep-1960 Today's Date: 10/17/2017    History of present illness pt s/p Laminectomy and Foraminotomy - Lumbar one-two, Lumbar two-three, Lumbar three-four  - bilateral on 10/15/17   OT comments  Pt making good progress towards OT goals. Demonstrated room level functional mobility and standing grooming ADLs using RW with overall minguard assist and with good maintenance of back precautions throughout. Pt continues to require min-modA for toileting and LB ADLs. Reviewed and educated pt on AE for increasing independence with these ADL tasks with pt verbalizing and return demonstrating understanding. Feel POC remains appropriate. Will continue to follow acutely to progress pt towards established OT goals.   Follow Up Recommendations  Home health OT;Supervision/Assistance - 24 hour    Equipment Recommendations  3 in 1 bedside commode;Other (comment)(will need bariatric 3:1)          Precautions / Restrictions Precautions Precautions: Back Precaution Comments: reviewed back precautions, no brace required Restrictions Weight Bearing Restrictions: No       Mobility Bed Mobility Overal bed mobility: Needs Assistance Bed Mobility: Rolling;Sidelying to Sit Rolling: Min guard Sidelying to sit: Min guard       General bed mobility comments: minguard for safety, increased effort to complete; pt completing from flat bed, use of bedrails   Transfers Overall transfer level: Needs assistance Equipment used: Rolling walker (2 wheeled) Transfers: Sit to/from Stand Sit to Stand: Min guard         General transfer comment: min guard for balance upon initial standing; pt with good maintenance of back precautions during sit<>stand     Balance Overall balance assessment: Needs assistance Sitting-balance support: Feet supported Sitting balance-Leahy Scale: Good       Standing balance-Leahy Scale:  Fair Standing balance comment: requires RW for balance during mobility; able to static stand during grooming ADLs with supervision                            ADL either performed or assessed with clinical judgement   ADL Overall ADL's : Needs assistance/impaired     Grooming: Min guard;Supervision/safety;Wash/dry face;Oral care;Wash/dry hands;Standing Grooming Details (indicate cue type and reason): pt demonstrating good carryover using compensatory strategies              Lower Body Dressing: Minimal assistance;With adaptive equipment;Sit to/from stand Lower Body Dressing Details (indicate cue type and reason): educated on use of sock aide and reacher for LB ADLs with pt return demonstrating understanding  Toilet Transfer: Min guard;Ambulation;Regular Toilet;Grab bars;RW   Toileting- Clothing Manipulation and Hygiene: Minimal assistance;Sit to/from stand Toileting - Clothing Manipulation Details (indicate cue type and reason): assist for peri-care; pt plans to purchase toilet aide      Functional mobility during ADLs: Min guard;Rolling walker General ADL Comments: pt with good recall and carryover of back precautions this session      Vision       Perception     Praxis      Cognition Arousal/Alertness: Awake/alert Behavior During Therapy: WFL for tasks assessed/performed Overall Cognitive Status: Within Functional Limits for tasks assessed                                          Exercises     Shoulder Instructions       General Comments  Pertinent Vitals/ Pain       Pain Assessment: 0-10 Pain Score: 4  Pain Location: back Pain Descriptors / Indicators: Aching;Grimacing;Guarding Pain Intervention(s): Monitored during session  Home Living                                          Prior Functioning/Environment              Frequency  Min 2X/week        Progress Toward Goals  OT Goals(current  goals can now be found in the care plan section)  Progress towards OT goals: Progressing toward goals  Acute Rehab OT Goals Patient Stated Goal: go home soon OT Goal Formulation: With patient Time For Goal Achievement: 10/30/17 Potential to Achieve Goals: Good  Plan Discharge plan remains appropriate    Co-evaluation                 AM-PAC PT "6 Clicks" Daily Activity     Outcome Measure   Help from another person eating meals?: None Help from another person taking care of personal grooming?: None Help from another person toileting, which includes using toliet, bedpan, or urinal?: A Little Help from another person bathing (including washing, rinsing, drying)?: A Little Help from another person to put on and taking off regular upper body clothing?: None Help from another person to put on and taking off regular lower body clothing?: A Little 6 Click Score: 21    End of Session Equipment Utilized During Treatment: Gait belt;Rolling walker  OT Visit Diagnosis: Other abnormalities of gait and mobility (R26.89);Pain Pain - part of body: (back )   Activity Tolerance Patient tolerated treatment well   Patient Left in chair;with call bell/phone within reach   Nurse Communication Mobility status        Time: 8182-9937 OT Time Calculation (min): 31 min  Charges: OT General Charges $OT Visit: 1 Visit OT Treatments $Self Care/Home Management : 8-22 mins  Lou Cal, OT Pager [phone] 10/17/2017    [name] 10/17/2017, 11:31 AM

## 2017-10-17 NOTE — Progress Notes (Signed)
Physical Therapy Treatment Patient Details Name: Heather Conrad MRN: [id number] DOB: [birth year] Today's Date: 10/17/2017    History of Present Illness pt s/p Laminectomy and Foraminotomy - Lumbar one-two, Lumbar two-three, Lumbar three-four  - bilateral on 10/15/17    PT Comments    Pt progressing well with mobility. Min guard assist provided for transfers and supervision ambulation 120 feet with RW.    Follow Up Recommendations  Home health PT     Equipment Recommendations  None recommended by PT    Recommendations for Other Services       Precautions / Restrictions Precautions Precautions: Back Precaution Comments: reviewed back precautions, no brace required Restrictions Weight Bearing Restrictions: No    Mobility  Bed Mobility Overal bed mobility: Needs Assistance Bed Mobility: Rolling;Sidelying to Sit Rolling: Min guard Sidelying to sit: Min guard       General bed mobility comments: Pt received in recliner.  Transfers Overall transfer level: Needs assistance Equipment used: Rolling walker (2 wheeled) Transfers: Sit to/from Stand Sit to Stand: Min guard         General transfer comment: min guard for safety, increased time to stabilize initial standing balance  Ambulation/Gait Ambulation/Gait assistance: Supervision Gait Distance (Feet): 120 Feet Assistive device: Rolling walker (2 wheeled) Gait Pattern/deviations: Step-through pattern;Decreased stride length Gait velocity: decreased Gait velocity interpretation: <1.31 ft/sec, indicative of household ambulator General Gait Details: slow, guarded gait   Stairs             Wheelchair Mobility    Modified Rankin (Stroke Patients Only)       Balance Overall balance assessment: Needs assistance Sitting-balance support: Feet supported Sitting balance-Leahy Scale: Good     Standing balance support: During functional activity;Bilateral upper extremity supported Standing balance-Leahy  Scale: Fair Standing balance comment: RW for ambulation                            Cognition Arousal/Alertness: Awake/alert Behavior During Therapy: WFL for tasks assessed/performed Overall Cognitive Status: Within Functional Limits for tasks assessed                                        Exercises      General Comments        Pertinent Vitals/Pain Pain Assessment: 0-10 Pain Score: 5  Pain Location: back Pain Descriptors / Indicators: Sore;Grimacing;Guarding Pain Intervention(s): Monitored during session;Repositioned    Home Living                      Prior Function            PT Goals (current goals can now be found in the care plan section) Acute Rehab PT Goals Patient Stated Goal: go home soon PT Goal Formulation: With patient Time For Goal Achievement: 10/30/17 Potential to Achieve Goals: Good Progress towards PT goals: Progressing toward goals    Frequency    Min 5X/week      PT Plan Current plan remains appropriate    Co-evaluation              AM-PAC PT "6 Clicks" Daily Activity  Outcome Measure  Difficulty turning over in bed (including adjusting bedclothes, sheets and blankets)?: A Little Difficulty moving from lying on back to sitting on the side of the bed? : A Little Difficulty sitting down on and standing  up from a chair with arms (e.g., wheelchair, bedside commode, etc,.)?: A Little Help needed moving to and from a bed to chair (including a wheelchair)?: A Little Help needed walking in hospital room?: A Little Help needed climbing 3-5 steps with a railing? : A Little 6 Click Score: 18    End of Session Equipment Utilized During Treatment: Gait belt Activity Tolerance: Patient tolerated treatment well Patient left: in chair;with call bell/phone within reach Nurse Communication: Mobility status PT Visit Diagnosis: Muscle weakness (generalized) (M62.81);Other abnormalities of gait and mobility  (R26.89);Pain     Time: 3167-4255 PT Time Calculation (min) (ACUTE ONLY): 16 min  Charges:  $Gait Training: 8-22 mins                     Lorrin Goodell, [place]  Office # [phone] Pager [phone]    Lorriane Shire 10/17/2017, 11:46 AM

## 2017-10-17 NOTE — Plan of Care (Signed)

## 2017-10-17 NOTE — Care Management Note (Signed)
Case Management Note  Patient Details  Name: Heather Conrad MRN: [id number] Date of Birth: [birth year]  Subjective/Objective:                    Action/Plan: Pt discharging home with orders for [facility] services. CM provided choice of [place] agencies and she selected [facility]. Jermaine with [facility] notified and accepted the referral.  Patient states her daughter will provide supervision at home and transportation to home.   Expected Discharge Date:  10/17/17               Expected Discharge Plan:  [facility]  In-House Referral:     Discharge planning Services  CM Consult  Post Acute Care Choice:  Home Health, Durable Medical Equipment Choice offered to:  Patient  DME Arranged:  Walker rolling DME Agency:  [place]:  PT [place]:  [place]  Status of Service:  Completed, signed off  If discussed at [place] of Stay Meetings, dates discussed:    Additional Comments:  [name], RN 10/17/2017, 1:42 PM

## 2017-10-17 NOTE — Progress Notes (Signed)
Patient is for discharge to home.  IV discontinued.  Hemovac removed and dressing changed.  AVS discussed. Patient comfortable and vital sign stable.

## 2017-10-20 ENCOUNTER — Ambulatory Visit: Payer: 59 | Admitting: Certified Nurse Midwife

## 2017-11-07 ENCOUNTER — Encounter: Payer: Self-pay | Admitting: Radiation Oncology

## 2017-11-07 ENCOUNTER — Other Ambulatory Visit: Payer: Self-pay

## 2017-11-07 ENCOUNTER — Ambulatory Visit
Admission: RE | Admit: 2017-11-07 | Discharge: 2017-11-07 | Disposition: A | Discharge status: Home / Self Care | Payer: 59 | Source: Ambulatory Visit | Attending: Radiation Oncology | Admitting: Radiation Oncology

## 2017-11-07 DIAGNOSIS — Z170 Estrogen receptor positive status [ER+]: Secondary | ICD-10-CM | POA: Diagnosis not present

## 2017-11-07 DIAGNOSIS — C50411 Malignant neoplasm of upper-outer quadrant of right female breast: Secondary | ICD-10-CM | POA: Diagnosis present

## 2017-11-07 NOTE — Progress Notes (Signed)
Radiation Oncology Follow up Note  Name: Heather Conrad   Date:   11/07/2017 MRN:  [id number] DOB: 1961-01-26    This 57 y.o. 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  REFERRING PROVIDER: Perrin Maltese, MD  HPI: Heather Conrad is a 57 year old female originally consult back in May 2019. She recently was reconsult to have whole breast radiation for a T1 N0 ER/PR positive HER-2/neu negative invasive mammary carcinoma..she went on to have back surgery recovering from [place] at this point and is now ready to commence with her radiation therapy treatments. She is otherwise doing well specifically denies any breast tenderness cough or bone pain.  COMPLICATIONS OF TREATMENT: none  FOLLOW UP COMPLIANCE: keeps appointments   PHYSICAL EXAM:  BP (P) 134/82 (BP Location: Left Arm)   Pulse (!) (P) 52   Temp (P) 98.5 F (36.9 C) (Tympanic)   Wt (!) (P) 300 lb 13.1 oz (136.4 kg)   BMI (P) 53.30 kg/m  Lungs are clear to A&P cardiac examination essentially unremarkable with regular rate and rhythm. No dominant mass or nodularity is noted in either breast in 2 positions examined. Incision is well-healed. No axillary or supraclavicular adenopathy is appreciated. Cosmetic result is excellent.Well-developed well-nourished patient in NAD. HEENT reveals PERLA, EOMI, discs not visualized.  Oral cavity is clear. No oral mucosal lesions are identified. Neck is clear without evidence of cervical or supraclavicular adenopathy. Lungs are clear to A&P. Cardiac examination is essentially unremarkable with regular rate and rhythm without murmur rub or thrill. Abdomen is benign with no organomegaly or masses noted. Motor sensory and DTR levels are equal and symmetric in the upper and lower extremities. Cranial nerves II through XII are grossly intact. Proprioception is intact. No peripheral adenopathy  or edema is identified. No motor or sensory levels are noted. Crude visual fields are within normal range.  RADIOLOGY RESULTS: no current films for review  PLAN: at this time again reviewed the risks and benefits as previously outlined for whole breast radiation. Patient comprehend my treatment plan well have personally set up and ordered CT simulation for next week. Patient Has my treatment plan well.  I would like to take this opportunity to thank you for allowing me to participate in the care of your patient.Noreene Filbert, MD

## 2017-11-12 ENCOUNTER — Ambulatory Visit
Admission: RE | Admit: 2017-11-12 | Discharge: 2017-11-12 | Disposition: A | Discharge status: Home / Self Care | Payer: 59 | Source: Ambulatory Visit | Attending: Radiation Oncology | Admitting: Radiation Oncology

## 2017-11-12 DIAGNOSIS — Z170 Estrogen receptor positive status [ER+]: Secondary | ICD-10-CM | POA: Insufficient documentation

## 2017-11-12 DIAGNOSIS — C50911 Malignant neoplasm of unspecified site of right female breast: Secondary | ICD-10-CM | POA: Insufficient documentation

## 2017-11-13 ENCOUNTER — Other Ambulatory Visit: Payer: Self-pay | Admitting: *Deleted

## 2017-11-13 DIAGNOSIS — C50911 Malignant neoplasm of unspecified site of right female breast: Secondary | ICD-10-CM | POA: Diagnosis not present

## 2017-11-13 DIAGNOSIS — Z170 Estrogen receptor positive status [ER+]: Principal | ICD-10-CM

## 2017-11-13 DIAGNOSIS — C50411 Malignant neoplasm of upper-outer quadrant of right female breast: Secondary | ICD-10-CM

## 2017-11-18 ENCOUNTER — Encounter (INDEPENDENT_AMBULATORY_CARE_PROVIDER_SITE_OTHER): Payer: 59

## 2017-11-20 ENCOUNTER — Ambulatory Visit
Admission: RE | Admit: 2017-11-20 | Discharge: 2017-11-20 | Disposition: A | Discharge status: Home / Self Care | Payer: 59 | Source: Ambulatory Visit | Attending: Radiation Oncology | Admitting: Radiation Oncology

## 2017-11-20 DIAGNOSIS — Z170 Estrogen receptor positive status [ER+]: Secondary | ICD-10-CM | POA: Diagnosis present

## 2017-11-20 DIAGNOSIS — C50911 Malignant neoplasm of unspecified site of right female breast: Secondary | ICD-10-CM | POA: Diagnosis present

## 2017-11-23 ENCOUNTER — Encounter (INDEPENDENT_AMBULATORY_CARE_PROVIDER_SITE_OTHER): Payer: Self-pay | Admitting: Bariatrics

## 2017-11-24 ENCOUNTER — Ambulatory Visit: Payer: 59

## 2017-11-24 ENCOUNTER — Encounter (INDEPENDENT_AMBULATORY_CARE_PROVIDER_SITE_OTHER): Payer: Self-pay

## 2017-11-24 ENCOUNTER — Encounter (INDEPENDENT_AMBULATORY_CARE_PROVIDER_SITE_OTHER): Payer: Self-pay | Admitting: Bariatrics

## 2017-11-24 ENCOUNTER — Ambulatory Visit (INDEPENDENT_AMBULATORY_CARE_PROVIDER_SITE_OTHER): Payer: 59 | Admitting: Bariatrics

## 2017-11-24 NOTE — Telephone Encounter (Signed)
Please advise 

## 2017-11-24 NOTE — Telephone Encounter (Signed)
Please address

## 2017-11-25 ENCOUNTER — Ambulatory Visit
Admission: RE | Admit: 2017-11-25 | Discharge: 2017-11-25 | Disposition: A | Discharge status: Home / Self Care | Payer: 59 | Source: Ambulatory Visit | Attending: Radiation Oncology | Admitting: Radiation Oncology

## 2017-11-25 ENCOUNTER — Ambulatory Visit (INDEPENDENT_AMBULATORY_CARE_PROVIDER_SITE_OTHER): Payer: 59 | Admitting: Bariatrics

## 2017-11-25 ENCOUNTER — Encounter (INDEPENDENT_AMBULATORY_CARE_PROVIDER_SITE_OTHER): Payer: Self-pay | Admitting: Bariatrics

## 2017-11-25 VITALS — BP 125/80 | HR 52 | Temp 98.0°F | Ht 62.0 in | Wt 291.0 lb

## 2017-11-25 DIAGNOSIS — C50911 Malignant neoplasm of unspecified site of right female breast: Secondary | ICD-10-CM | POA: Diagnosis not present

## 2017-11-25 DIAGNOSIS — E559 Vitamin D deficiency, unspecified: Secondary | ICD-10-CM

## 2017-11-25 DIAGNOSIS — Z6841 Body Mass Index (BMI) 40.0 and over, adult: Secondary | ICD-10-CM

## 2017-11-25 DIAGNOSIS — I10 Essential (primary) hypertension: Secondary | ICD-10-CM | POA: Diagnosis not present

## 2017-11-25 DIAGNOSIS — F3289 Other specified depressive episodes: Secondary | ICD-10-CM

## 2017-11-25 DIAGNOSIS — R5383 Other fatigue: Secondary | ICD-10-CM

## 2017-11-25 DIAGNOSIS — Z0289 Encounter for other administrative examinations: Secondary | ICD-10-CM

## 2017-11-25 DIAGNOSIS — Z9189 Other specified personal risk factors, not elsewhere classified: Secondary | ICD-10-CM

## 2017-11-25 DIAGNOSIS — R0602 Shortness of breath: Secondary | ICD-10-CM | POA: Insufficient documentation

## 2017-11-26 ENCOUNTER — Ambulatory Visit
Admission: RE | Admit: 2017-11-26 | Discharge: 2017-11-26 | Disposition: A | Discharge status: Home / Self Care | Payer: 59 | Source: Ambulatory Visit | Attending: Radiation Oncology | Admitting: Radiation Oncology

## 2017-11-26 DIAGNOSIS — C50911 Malignant neoplasm of unspecified site of right female breast: Secondary | ICD-10-CM | POA: Diagnosis not present

## 2017-11-26 LAB — COMPREHENSIVE METABOLIC PANEL
A/G RATIO: 1.6 (ref 1.2–2.2)
ALT: 12 IU/L (ref 0–32)
AST: 13 IU/L (ref 0–40)
Albumin: 4.3 g/dL (ref 3.5–5.5)
Alkaline Phosphatase: 94 IU/L (ref 39–117)
BUN/Creatinine Ratio: 16 (ref 9–23)
BUN: 22 mg/dL (ref 6–24)
Bilirubin Total: 0.7 mg/dL (ref 0.0–1.2)
CALCIUM: 9.6 mg/dL (ref 8.7–10.2)
CO2: 22 mmol/L (ref 20–29)
CREATININE: 1.39 mg/dL — AB (ref 0.57–1.00)
Chloride: 100 mmol/L (ref 96–106)
GFR calc Af Amer: 49 mL/min/{1.73_m2} — ABNORMAL LOW (ref >59)
GFR, EST NON AFRICAN AMERICAN: 42 mL/min/{1.73_m2} — AB (ref >59)
GLOBULIN, TOTAL: 2.7 g/dL (ref 1.5–4.5)
Glucose: 83 mg/dL (ref 65–99)
POTASSIUM: 4.5 mmol/L (ref 3.5–5.2)
SODIUM: 139 mmol/L (ref 134–144)
TOTAL PROTEIN: 7 g/dL (ref 6.0–8.5)

## 2017-11-26 LAB — CBC WITH DIFFERENTIAL
BASOS: 0 %
Basophils Absolute: 0 10*3/uL (ref 0.0–0.2)
EOS (ABSOLUTE): 0.2 10*3/uL (ref 0.0–0.4)
Eos: 2 %
HEMATOCRIT: 41.3 % (ref 34.0–46.6)
HEMOGLOBIN: 13.6 g/dL (ref 11.1–15.9)
IMMATURE GRANS (ABS): 0 10*3/uL (ref 0.0–0.1)
IMMATURE GRANULOCYTES: 1 %
Lymphocytes Absolute: 1.8 10*3/uL (ref 0.7–3.1)
Lymphs: 21 %
MCH: 30.2 pg (ref 26.6–33.0)
MCHC: 32.9 g/dL (ref 31.5–35.7)
MCV: 92 fL (ref 79–97)
MONOCYTES: 6 %
MONOS ABS: 0.5 10*3/uL (ref 0.1–0.9)
Neutrophils Absolute: 5.9 10*3/uL (ref 1.4–7.0)
Neutrophils: 70 %
RBC: 4.51 x10E6/uL (ref 3.77–5.28)
RDW: 13.1 % (ref 12.3–15.4)
WBC: 8.4 10*3/uL (ref 3.4–10.8)

## 2017-11-26 LAB — TSH: TSH: 2.43 u[IU]/mL (ref 0.450–4.500)

## 2017-11-26 LAB — LIPID PANEL WITH LDL/HDL RATIO
Cholesterol, Total: 184 mg/dL (ref 100–199)
HDL: 48 mg/dL (ref >39)
LDL Calculated: 121 mg/dL — ABNORMAL HIGH (ref 0–99)
LDL/HDL RATIO: 2.5 ratio (ref 0.0–3.2)
TRIGLYCERIDES: 77 mg/dL (ref 0–149)
VLDL CHOLESTEROL CAL: 15 mg/dL (ref 5–40)

## 2017-11-26 LAB — INSULIN, RANDOM: INSULIN: 18.6 u[IU]/mL (ref 2.6–24.9)

## 2017-11-26 LAB — T3: T3, Total: 76 ng/dL (ref 71–180)

## 2017-11-26 LAB — T4, FREE: Free T4: 1.64 ng/dL (ref 0.82–1.77)

## 2017-11-26 LAB — VITAMIN D 25 HYDROXY (VIT D DEFICIENCY, FRACTURES): VIT D 25 HYDROXY: 51.6 ng/mL (ref 30.0–100.0)

## 2017-11-26 LAB — HEMOGLOBIN A1C
Est. average glucose Bld gHb Est-mCnc: 114 mg/dL
Hgb A1c MFr Bld: 5.6 % (ref 4.8–5.6)

## 2017-11-27 ENCOUNTER — Ambulatory Visit
Admission: RE | Admit: 2017-11-27 | Discharge: 2017-11-27 | Disposition: A | Discharge status: Home / Self Care | Payer: 59 | Source: Ambulatory Visit | Attending: Radiation Oncology | Admitting: Radiation Oncology

## 2017-11-27 DIAGNOSIS — C50911 Malignant neoplasm of unspecified site of right female breast: Secondary | ICD-10-CM | POA: Diagnosis not present

## 2017-11-27 NOTE — Progress Notes (Signed)
Office: [phone]  /  Fax: [phone]   Dear Dr. Noemi Chapel,   Thank you for referring KEIRI SOLANO to our clinic. The following note includes my evaluation and treatment recommendations.  HPI:   Chief Complaint: OBESITY    Heather Conrad has been referred by Elsie Saas, MD for consultation regarding her obesity and obesity related comorbidities.    LEGNA MAUSOLF (MR# [id number]) is a 57 y.o. female who presents on 11/25/2017 for obesity evaluation and treatment. Current BMI is Body mass index is 53.22 kg/m.Marland Kitchen Nalany has been struggling with her weight for many years and has been unsuccessful in either losing weight, maintaining weight loss, or reaching her healthy weight goal.     Meena needs bilateral knee replacement and she needs to get her BMI to approximately 40. Derian struggles with skipping meals and lack of meal planning.     Iqra attended our information session and states she is currently in the action stage of change and ready to dedicate time achieving and maintaining a healthier weight. Noor is interested in becoming our patient and working on intensive lifestyle modifications including (but not limited to) diet, exercise and weight loss.    Raaga states her family eats meals together she thinks her family will eat healthier with  her her desired weight loss is 91 lbs she has been heavy most of  her life she started gaining weight after her second child her heaviest weight ever was 305 lbs she is a picky eater and doesn't like to eat healthier foods  she has significant food cravings issues  she snacks frequently in the evenings she wakes up frquently in the middle of the night to eat she is frequently drinking liquids with calories she frequently makes poor food choices she has problems with excessive hunger  she frequently eats larger portions than normal  she has binge eating behaviors she struggles with emotional eating    Fatigue Tiffani feels her energy  is lower than it should be. This has worsened with weight gain and has not worsened recently. Kiora admits to daytime somnolence and  admits to waking up still tired. Patient is at risk for obstructive sleep apnea. Patent has a history of symptoms of daytime fatigue. Patient generally gets 7 hours of sleep per night, and states they generally have generally restful sleep. Snoring is not present. Apneic episodes are not present. Epworth Sleepiness Score is 4.  Dyspnea on exertion Glenn notes increasing shortness of breath with exercising and seems to be worsening over time with weight gain. She notes getting out of breath sooner with activity than she used to. This has not gotten worse recently. Cherl denies orthopnea.  Hypertension SKYLER DUSING is a 58 y.o. female with hypertension. Ellie's blood pressure is controlled. No hypotension or lightheadedness. She is taking lisinopril-hydrochlorothiazide and Lasix and she denies side effects with the medications. She is working weight loss to help control her blood pressure with the goal of decreasing her risk of heart attack and stroke.   Vitamin D Deficiency Shaniya has a diagnosis of vitamin D deficiency. She is taking Vit D3 50,000 IU and denies nausea, vomiting or muscle weakness.  Depression with emotional eating behaviors Shaquanta states she is possible binge eating, no official diagnosis, and she states she is eating until she is stuffed. Abcde struggles with emotional eating and using food for comfort to the extent that it is negatively impacting her health. She often snacks when she is not hungry.  Alyssandra sometimes feels she is out of control and then feels guilty that she made poor food choices. She has been working on behavior modification techniques to help reduce her emotional eating and has been somewhat successful. She shows no sign of suicidal or homicidal ideations.  Depression screen [facility] 2/9 11/25/2017 10/09/2017  Decreased Interest 3 0  Down,  Depressed, Hopeless 3 0  PHQ - 2 Score 6 0  Altered sleeping 1 -  Tired, decreased energy 3 -  Change in appetite 3 -  Feeling bad or failure about yourself  3 -  Trouble concentrating 2 -  Moving slowly or fidgety/restless 0 -  Suicidal thoughts 0 -  PHQ-9 Score 18 -  Difficult doing work/chores Very difficult -    Depression Screen Jenicka's Food and Mood (modified PHQ-9) score was  Depression screen PHQ 2/9 11/25/2017  Decreased Interest 3  Down, Depressed, Hopeless 3  PHQ - 2 Score 6  Altered sleeping 1  Tired, decreased energy 3  Change in appetite 3  Feeling bad or failure about yourself  3  Trouble concentrating 2  Moving slowly or fidgety/restless 0  Suicidal thoughts 0  PHQ-9 Score 18  Difficult doing work/chores Very difficult   At risk for diabetes Litha is at higher than average risk for developing diabetes due to her obesity. She currently denies polyuria or polydipsia.  ALLERGIES: No Known Allergies  MEDICATIONS: Current Outpatient Medications on File Prior to Visit  Medication Sig Dispense Refill  . amiodarone (PACERONE) 200 MG tablet Take 200 mg by mouth 2 (two) times a week. Sunday and Wednesday    . Cholecalciferol (VITAMIN D3) 50000 units CAPS Take 50,000 Units by mouth every Sunday.   2  . citalopram (CELEXA) 40 MG tablet Take 40 mg by mouth daily.  6  . furosemide (LASIX) 20 MG tablet Take 20 mg by mouth daily.  2  . HYDROcodone-acetaminophen (NORCO) 7.5-325 MG tablet Take 1 tablet by mouth every 6 (six) hours. 30 tablet 0  . lisinopril-hydrochlorothiazide (PRINZIDE,ZESTORETIC) 10-12.5 MG tablet Take 1 tablet by mouth daily.  2  . meloxicam (MOBIC) 15 MG tablet Take 15 mg by mouth daily.    . methocarbamol (ROBAXIN) 500 MG tablet Take 1 tablet (500 mg total) by mouth every 6 (six) hours as needed for muscle spasms. 30 tablet 0  . XARELTO 20 MG TABS tablet TK 1 T PO QD  2   No current facility-administered medications on file prior to visit.      PAST MEDICAL HISTORY: Past Medical History:  Diagnosis Date  . Acute renal failure (ARF) ([place]) 01/2012  . Anxiety   . Arthritis   . Breast cancer in female [facility])    Right  . Cancer ([place])   . Cough   . Dry skin   . Dyspnea   . Dysrhythmia 01/2017   atrial fibrillation  . Fatigue   . Heart murmur    per pt report  . History of kidney stones   . History of MRSA infection 2013   leg, abdomen, arm  . Hyperlipidemia 2018  . Hypertension   . Kidney stone 01/2017  . Leg cramping   . Leg pain   . Lumbar stenosis   . Nervousness   . Obesity 01/2017  . Palpitations   . Pyelonephritis   . Shortness of breath on exertion   . Stress   . Swelling of both lower extremities   . UTI (urinary tract infection)   . Vitamin  D deficiency   . Weakness     PAST SURGICAL HISTORY: Past Surgical History:  Procedure Laterality Date  . BREAST BIOPSY Right 07/16/2017   pending path  . BREAST LUMPECTOMY WITH RADIOACTIVE SEED AND SENTINEL LYMPH NODE BIOPSY Right 09/01/2017   Procedure: BREAST LUMPECTOMY WITH RADIOACTIVE SEED AND SENTINEL LYMPH NODE BIOPSY;  Surgeon: Alphonsa Overall, MD;  Location: [place];  Service: General;  Laterality: Right;  . CHOLECYSTECTOMY     1992  . DILATION AND CURETTAGE OF UTERUS    . EXTRACORPOREAL SHOCK WAVE LITHOTRIPSY Left 02/13/2017   Procedure: EXTRACORPOREAL SHOCK WAVE LITHOTRIPSY (ESWL);  Surgeon: Royston Cowper, MD;  Location: [facility];  Service: Urology;  Laterality: Left;  . FOOT FRACTURE SURGERY Right 2018   repair of trigonem of ankle. endoscopic fasciotomy  . KNEE ARTHROSCOPY Right 2013   meniscus  . KNEE SURGERY Right    knee scope for torn meniscus  . LUMBAR LAMINECTOMY/DECOMPRESSION MICRODISCECTOMY Bilateral 10/15/2017   Procedure: Laminectomy and Foraminotomy - Lumbar one-two, Lumbar two-three, Lumbar three-four  - bilateral;  Surgeon: Eustace Moore, MD;  Location: [place];  Service: Neurosurgery;  Laterality: Bilateral;    SOCIAL  HISTORY: Social History   Tobacco Use  . Smoking status: Never Smoker  . Smokeless tobacco: Never Used  Substance Use Topics  . Alcohol use: No  . Drug use: No    FAMILY HISTORY: Family History  Problem Relation Age of Onset  . Heart disease Father        deceased 63; had 80 siblings  . Hypertension Father   . Kidney disease Father   . Colon cancer Paternal Uncle 49       deceased 44s  . Hypertension Paternal Uncle   . Diabetes Maternal Aunt   . Hypertension Maternal Aunt   . Heart disease Mother        deceased at 90; had 7 siblings  . Hypertension Mother   . Hyperlipidemia Mother   . Depression Mother   . Anxiety disorder Mother   . Sleep apnea Mother   . Obesity Mother   . Uterine cancer Sister 35       currently 32; no children  . Breast cancer Cousin 46       currently 34; daughter of unaffected maternal aunt    ROS: Review of Systems  Constitutional: Positive for malaise/fatigue. Negative for weight loss.  HENT:       + Dentures  Eyes:       + Wear glasses or contacts  Respiratory: Positive for cough and shortness of breath (with exertion).   Cardiovascular: Negative for chest pain and orthopnea.       + Calf/leg pain with walking + Leg cramping  Gastrointestinal: Negative for nausea and vomiting.  Genitourinary: Negative for frequency.  Musculoskeletal:       Negative muscle weakness  Skin:       + Dryness  Neurological: Positive for weakness.  Endo/Heme/Allergies: Negative for polydipsia. Bruises/bleeds easily.  Psychiatric/Behavioral: Positive for depression. Negative for suicidal ideas. The patient is nervous/anxious.        + Stress    PHYSICAL EXAM: Blood pressure 125/80, pulse (!) 52, temperature 98 F (36.7 C), temperature source Oral, height 5\' 2"  (1.575 m), weight 291 lb (132 kg), SpO2 96 %. Body mass index is 53.22 kg/m. Physical Exam  Constitutional: She is oriented to person, place, and time. She appears well-developed and  well-nourished.  Using a walker for ambulation (outside of home)  HENT:  Head: Normocephalic and atraumatic.  Nose: Nose normal.  Mallampati +3  Eyes: EOM are normal. No scleral icterus.  Neck: Normal range of motion. Neck supple. No thyromegaly present.  Cardiovascular: Normal rate and regular rhythm.  Pulmonary/Chest: Effort normal. No respiratory distress.  Abdominal: Soft. There is no tenderness.  + Obesity  Musculoskeletal:  Range of Motion normal in all 4 extremities Trace edema noted in bilateral lower extremities  Neurological: She is alert and oriented to person, place, and time. No cranial nerve deficit.  Skin: Skin is warm and dry.  Psychiatric: She has a normal mood and affect. Her behavior is normal.  Vitals reviewed.   RECENT LABS AND TESTS: BMET    Component Value Date/Time   NA 139 11/25/2017 1106   NA 133 (L) 05/06/2011 2025   K 4.5 11/25/2017 1106   K 4.0 05/06/2011 2025   CL 100 11/25/2017 1106   CL 98 05/06/2011 2025   CO2 22 11/25/2017 1106   CO2 22 05/06/2011 2025   GLUCOSE 83 11/25/2017 1106   GLUCOSE 99 10/13/2017 0919   GLUCOSE 95 05/06/2011 2025   BUN 22 11/25/2017 1106   BUN 38 (H) 05/06/2011 2025   CREATININE 1.39 (H) 11/25/2017 1106   CREATININE 3.09 (H) 05/06/2011 2025   CALCIUM 9.6 11/25/2017 1106   CALCIUM 7.5 (L) 05/06/2011 2025   GFRNONAA 42 (L) 11/25/2017 1106   GFRNONAA 17 (L) 05/06/2011 2025   GFRAA 49 (L) 11/25/2017 1106   GFRAA 21 (L) 05/06/2011 2025   Lab Results  Component Value Date   HGBA1C 5.6 11/25/2017   Lab Results  Component Value Date   INSULIN 18.6 11/25/2017   CBC    Component Value Date/Time   WBC 8.4 11/25/2017 1106   WBC 7.7 10/13/2017 0919   RBC 4.51 11/25/2017 1106   RBC 4.10 10/13/2017 0919   HGB 13.6 11/25/2017 1106   HCT 41.3 11/25/2017 1106   PLT 262 10/13/2017 0919   PLT 318 02/24/2015 1352   MCV 92 11/25/2017 1106   MCV 90 05/06/2011 2025   MCH 30.2 11/25/2017 1106   MCH 30.2 10/13/2017  0919   MCHC 32.9 11/25/2017 1106   MCHC 32.5 10/13/2017 0919   RDW 13.1 11/25/2017 1106   RDW 13.8 05/06/2011 2025   LYMPHSABS 1.8 11/25/2017 1106   MONOABS 0.5 10/13/2017 0919   EOSABS 0.2 11/25/2017 1106   BASOSABS 0.0 11/25/2017 1106   Iron/TIBC/Ferritin/ %Sat No results found for: IRON, TIBC, FERRITIN, IRONPCTSAT Lipid Panel     Component Value Date/Time   CHOL 184 11/25/2017 1106   TRIG 77 11/25/2017 1106   HDL 48 11/25/2017 1106   LDLCALC 121 (H) 11/25/2017 1106   Hepatic Function Panel     Component Value Date/Time   PROT 7.0 11/25/2017 1106   PROT 6.3 (L) 05/06/2011 2025   ALBUMIN 4.3 11/25/2017 1106   ALBUMIN 2.8 (L) 05/06/2011 2025   AST 13 11/25/2017 1106   AST 28 05/06/2011 2025   ALT 12 11/25/2017 1106   ALT 22 05/06/2011 2025   ALKPHOS 94 11/25/2017 1106   ALKPHOS 76 05/06/2011 2025   BILITOT 0.7 11/25/2017 1106   BILITOT 3.1 (H) 05/06/2011 2025   BILIDIR 0.3 06/02/2017 1736   BILIDIR 0.11 02/24/2015 1352   IBILI 0.6 06/02/2017 1736      Component Value Date/Time   TSH 2.430 11/25/2017 1106    ECG  shows NSR with a rate of 52 BPM INDIRECT CALORIMETER done today shows  a VO2 of 259 and a REE of 1800.  Her calculated basal metabolic rate is 5993 thus her basal metabolic rate is worse than expected.    ASSESSMENT AND PLAN: Other fatigue - Plan: EKG 12-Lead, Hemoglobin A1c, Insulin, random, Lipid Panel With LDL/HDL Ratio, T3, T4, free, TSH  Shortness of breath on exertion  Essential hypertension - Plan: Comprehensive metabolic panel  Vitamin D deficiency - Plan: VITAMIN D 25 Hydroxy (Vit-D Deficiency, Fractures)  Other depression - with emotional eating  At risk for diabetes mellitus  Class 3 severe obesity with serious comorbidity and body mass index (BMI) of 50.0 to 59.9 in adult, unspecified obesity type (HCC)  PLAN:  Fatigue Cheria was informed that her fatigue may be related to obesity, depression or many other causes. Labs will be  ordered, and in the meanwhile Wilmarie has agreed to work on diet, exercise and weight loss to help with fatigue. Proper sleep hygiene was discussed including the need for 7-8 hours of quality sleep each night. A sleep study was not ordered based on symptoms and Epworth score.  Dyspnea on exertion Mihaela's shortness of breath appears to be obesity related and exercise induced. She has agreed to work on weight loss and gradually increase exercise to treat her exercise induced shortness of breath. She will continue to use her walker and follow up with her Orthopedist. If Yan follows our instructions and loses weight without improvement of her shortness of breath, we will plan to refer to pulmonology. We will monitor this condition regularly. Whittley agrees to this plan.  Hypertension We discussed sodium restriction, working on healthy weight loss, and a regular exercise program as the means to achieve improved blood pressure control. Tiarrah agreed with this plan and agreed to follow up as directed. We will continue to monitor her blood pressure as well as her progress with the above lifestyle modifications. She will continue her medications and will watch for signs of hypotension as she continues her lifestyle modifications. We will check labs and Sable agrees to follow up with our clinic in 2 weeks.  Vitamin D Deficiency Avaya was informed that low vitamin D levels contributes to fatigue and are associated with obesity, breast, and colon cancer. Eisa agrees to continue taking Vit D @50 ,000 IU every week and will follow up for routine testing of vitamin D, at least 2-3 times per year. She will increase vitamin D rich foods. She was informed of the risk of over-replacement of vitamin D and agrees to not increase her dose unless she discusses this with [place] first. We will check labs and Julina agrees to follow up with our clinic in 2 weeks.  Depression with Emotional Eating Behaviors We discussed behavior  modification techniques today to help Harlee deal with her emotional eating and depression. We will refer to Dr. Mallie Mussel, our bariatric psychologist. We discussed cognitive behavioral therapy strategies and may consider medication in  The future. Alexarae agrees to follow up with our clinic in 2 weeks.  Depression Screen Blue had a strongly positive depression screening. Depression is commonly associated with obesity and often results in emotional eating behaviors. We will monitor this closely and work on CBT to help improve the non-hunger eating patterns. Referral to Psychology may be required if no improvement is seen as she continues in our clinic.  Diabetes risk counselling Miski was given extended (15 minutes) diabetes prevention counseling today. She is 57 y.o. female and has risk factors for diabetes including obesity. We discussed intensive lifestyle  modifications today with an emphasis on weight loss as well as increasing exercise and decreasing simple carbohydrates in her diet.  Obesity Ahlana is currently in the action stage of change and her goal is to continue with weight loss efforts. I recommend Pacey begin the structured treatment plan as follows:  She has agreed to follow the Category 3 plan Davona has been instructed to eventually work up to a goal of 150 minutes of combined cardio and strengthening exercise per week for weight loss and overall health benefits. We discussed the following Behavioral Modification Strategies today: increasing lean protein intake, decreasing simple carbohydrates, increasing vegetables, decrease eating out, work on meal planning and easy cooking plans, increase H20 intake, and no skipping meals Ameila will begin Category 3, increase protein, and no skipping meals.   She was informed of the importance of frequent follow up visits to maximize her success with intensive lifestyle modifications for her multiple health conditions. She was informed we would discuss  her lab results at her next visit unless there is a critical issue that needs to be addressed sooner. Tateanna agreed to keep her next visit at the agreed upon time to discuss these results.    OBESITY BEHAVIORAL INTERVENTION VISIT  Today's visit was # 1   Starting weight: 291 lbs Starting date: 11/25/17 Today's weight : 291 lbs Today's date: 11/25/2017 Total lbs lost to date: 0    ASK: We discussed the diagnosis of obesity with [name] today and Bea agreed to give [place] permission to discuss obesity behavioral modification therapy today.  ASSESS: Mallori has the diagnosis of obesity and her BMI today is 53.21 Joelly is in the action stage of change   ADVISE: Miryah was educated on the multiple health risks of obesity as well as the benefit of weight loss to improve her health. She was advised of the need for long term treatment and the importance of lifestyle modifications to improve her current health and to decrease her risk of future health problems.  AGREE: Multiple dietary modification options and treatment options were discussed and  Mariell agreed to follow the recommendations documented in the above note.  ARRANGE: Lavaya was educated on the importance of frequent visits to treat obesity as outlined per CMS and USPSTF guidelines and agreed to schedule her next follow up appointment today.  Wilhemena Durie, am acting as transcriptionist for [employer], DO  I have reviewed the above documentation for accuracy and completeness, and I agree with the above. -Jearld Lesch, DO

## 2017-11-28 ENCOUNTER — Ambulatory Visit
Admission: RE | Admit: 2017-11-28 | Discharge: 2017-11-28 | Disposition: A | Discharge status: Home / Self Care | Payer: 59 | Source: Ambulatory Visit | Attending: Radiation Oncology | Admitting: Radiation Oncology

## 2017-11-28 DIAGNOSIS — C50911 Malignant neoplasm of unspecified site of right female breast: Secondary | ICD-10-CM | POA: Diagnosis not present

## 2017-12-01 ENCOUNTER — Ambulatory Visit
Admission: RE | Admit: 2017-12-01 | Discharge: 2017-12-01 | Disposition: A | Discharge status: Home / Self Care | Payer: 59 | Source: Ambulatory Visit | Attending: Radiation Oncology | Admitting: Radiation Oncology

## 2017-12-01 DIAGNOSIS — C50911 Malignant neoplasm of unspecified site of right female breast: Secondary | ICD-10-CM | POA: Diagnosis not present

## 2017-12-02 ENCOUNTER — Ambulatory Visit
Admission: RE | Admit: 2017-12-02 | Discharge: 2017-12-02 | Disposition: A | Discharge status: Home / Self Care | Payer: 59 | Source: Ambulatory Visit | Attending: Radiation Oncology | Admitting: Radiation Oncology

## 2017-12-02 DIAGNOSIS — C50911 Malignant neoplasm of unspecified site of right female breast: Secondary | ICD-10-CM | POA: Diagnosis not present

## 2017-12-03 ENCOUNTER — Ambulatory Visit
Admission: RE | Admit: 2017-12-03 | Discharge: 2017-12-03 | Disposition: A | Discharge status: Home / Self Care | Payer: 59 | Source: Ambulatory Visit | Attending: Radiation Oncology | Admitting: Radiation Oncology

## 2017-12-03 DIAGNOSIS — C50911 Malignant neoplasm of unspecified site of right female breast: Secondary | ICD-10-CM | POA: Diagnosis not present

## 2017-12-04 ENCOUNTER — Ambulatory Visit
Admission: RE | Admit: 2017-12-04 | Discharge: 2017-12-04 | Disposition: A | Discharge status: Home / Self Care | Payer: 59 | Source: Ambulatory Visit | Attending: Radiation Oncology | Admitting: Radiation Oncology

## 2017-12-04 DIAGNOSIS — C50911 Malignant neoplasm of unspecified site of right female breast: Secondary | ICD-10-CM | POA: Diagnosis not present

## 2017-12-05 ENCOUNTER — Ambulatory Visit
Admission: RE | Admit: 2017-12-05 | Discharge: 2017-12-05 | Disposition: A | Discharge status: Home / Self Care | Payer: 59 | Source: Ambulatory Visit | Attending: Radiation Oncology | Admitting: Radiation Oncology

## 2017-12-05 DIAGNOSIS — C50911 Malignant neoplasm of unspecified site of right female breast: Secondary | ICD-10-CM | POA: Diagnosis not present

## 2017-12-07 ENCOUNTER — Encounter (INDEPENDENT_AMBULATORY_CARE_PROVIDER_SITE_OTHER): Payer: Self-pay | Admitting: Bariatrics

## 2017-12-08 ENCOUNTER — Ambulatory Visit
Admission: RE | Admit: 2017-12-08 | Discharge: 2017-12-08 | Disposition: A | Discharge status: Home / Self Care | Payer: 59 | Source: Ambulatory Visit | Attending: Radiation Oncology | Admitting: Radiation Oncology

## 2017-12-08 DIAGNOSIS — C50911 Malignant neoplasm of unspecified site of right female breast: Secondary | ICD-10-CM | POA: Diagnosis not present

## 2017-12-09 ENCOUNTER — Ambulatory Visit
Admission: RE | Admit: 2017-12-09 | Discharge: 2017-12-09 | Disposition: A | Discharge status: Home / Self Care | Payer: 59 | Source: Ambulatory Visit | Attending: Radiation Oncology | Admitting: Radiation Oncology

## 2017-12-09 ENCOUNTER — Encounter (INDEPENDENT_AMBULATORY_CARE_PROVIDER_SITE_OTHER): Payer: Self-pay | Admitting: Bariatrics

## 2017-12-09 ENCOUNTER — Ambulatory Visit (INDEPENDENT_AMBULATORY_CARE_PROVIDER_SITE_OTHER): Payer: 59 | Admitting: Bariatrics

## 2017-12-09 ENCOUNTER — Ambulatory Visit (INDEPENDENT_AMBULATORY_CARE_PROVIDER_SITE_OTHER): Payer: 59 | Admitting: Psychology

## 2017-12-09 ENCOUNTER — Inpatient Hospital Stay: Payer: 59 | Attending: Radiation Oncology

## 2017-12-09 VITALS — BP 134/81 | HR 55 | Temp 98.1°F | Ht 62.0 in | Wt 298.0 lb

## 2017-12-09 DIAGNOSIS — I10 Essential (primary) hypertension: Secondary | ICD-10-CM | POA: Diagnosis not present

## 2017-12-09 DIAGNOSIS — F3289 Other specified depressive episodes: Secondary | ICD-10-CM | POA: Diagnosis not present

## 2017-12-09 DIAGNOSIS — Z170 Estrogen receptor positive status [ER+]: Secondary | ICD-10-CM | POA: Diagnosis not present

## 2017-12-09 DIAGNOSIS — N183 Chronic kidney disease, stage 3 unspecified: Secondary | ICD-10-CM

## 2017-12-09 DIAGNOSIS — C50411 Malignant neoplasm of upper-outer quadrant of right female breast: Secondary | ICD-10-CM | POA: Insufficient documentation

## 2017-12-09 DIAGNOSIS — C50911 Malignant neoplasm of unspecified site of right female breast: Secondary | ICD-10-CM | POA: Diagnosis not present

## 2017-12-09 DIAGNOSIS — Z6841 Body Mass Index (BMI) 40.0 and over, adult: Secondary | ICD-10-CM

## 2017-12-09 LAB — CBC
HEMATOCRIT: 36.1 % (ref 35.0–47.0)
Hemoglobin: 12.3 g/dL (ref 12.0–16.0)
MCH: 31 pg (ref 26.0–34.0)
MCHC: 34 g/dL (ref 32.0–36.0)
MCV: 91 fL (ref 80.0–100.0)
Platelets: 244 10*3/uL (ref 150–440)
RBC: 3.97 MIL/uL (ref 3.80–5.20)
RDW: 14.2 % (ref 11.5–14.5)
WBC: 8.6 10*3/uL (ref 3.6–11.0)

## 2017-12-10 ENCOUNTER — Ambulatory Visit
Admission: RE | Admit: 2017-12-10 | Discharge: 2017-12-10 | Disposition: A | Discharge status: Home / Self Care | Payer: 59 | Source: Ambulatory Visit | Attending: Radiation Oncology | Admitting: Radiation Oncology

## 2017-12-10 ENCOUNTER — Encounter (INDEPENDENT_AMBULATORY_CARE_PROVIDER_SITE_OTHER): Payer: Self-pay | Admitting: Bariatrics

## 2017-12-10 DIAGNOSIS — C50911 Malignant neoplasm of unspecified site of right female breast: Secondary | ICD-10-CM | POA: Diagnosis not present

## 2017-12-10 NOTE — Progress Notes (Signed)
Office: [phone]  /  Fax: [phone]   HPI:   Chief Complaint: OBESITY Heather Conrad is here to discuss her progress with her obesity treatment plan. She is on the Category 3 plan and is following her eating plan approximately 50 % of the time. She states she is exercising 0 minutes 0 times per week. [name] has been out on FMLA (for surgery) and doing radiation daily for breast cancer. She is not having chemotherapy. She has a decreased appetite from radiation and has to force herself to eat.  Her weight is 298 lb (135.2 kg) today and has not lost weight since her last visit. She has lost 0 lbs since starting treatment with [place].  Hypertension Heather Conrad is a 57 y.o. female with hypertension. Heather Conrad denies hypotension or lightheadedness. She is working on weight loss to help control her blood pressure with the goal of decreasing her risk of heart attack and stroke. Anamari will continue medications for hypertension.   Chronic Kidney Disease Class 3B Heather Conrad is not getting enough water. She is on Lasix, HCTZ, and Meloxicam.  Depression with emotional eating behaviors Heather Conrad is struggling with emotional eating and using food for comfort to the extent that it is negatively impacting her health. She shows no sign of suicidal or homicidal ideations.  Depression screen Heather Conrad 2/9 11/25/2017 10/09/2017  Decreased Interest 3 0  Down, Depressed, Hopeless 3 0  PHQ - 2 Score 6 0  Altered sleeping 1 -  Tired, decreased energy 3 -  Change in appetite 3 -  Feeling bad or failure about yourself  3 -  Trouble concentrating 2 -  Moving slowly or fidgety/restless 0 -  Suicidal thoughts 0 -  PHQ-9 Score 18 -  Difficult doing work/chores Very difficult -   ALLERGIES: No Known Allergies  MEDICATIONS: Current Outpatient Medications on File Prior to Visit  Medication Sig Dispense Refill  . amiodarone (PACERONE) 200 MG tablet Take 200 mg by mouth 2 (two) times a week. Sunday and Wednesday    .  Cholecalciferol (VITAMIN D3) 50000 units CAPS Take 50,000 Units by mouth every Sunday.   2  . citalopram (CELEXA) 40 MG tablet Take 40 mg by mouth daily.  6  . furosemide (LASIX) 20 MG tablet Take 20 mg by mouth daily.  2  . HYDROcodone-acetaminophen (NORCO) 7.5-325 MG tablet Take 1 tablet by mouth every 6 (six) hours. 30 tablet 0  . lisinopril-hydrochlorothiazide (PRINZIDE,ZESTORETIC) 10-12.5 MG tablet Take 1 tablet by mouth daily.  2  . meloxicam (MOBIC) 15 MG tablet Take 15 mg by mouth daily.    . methocarbamol (ROBAXIN) 500 MG tablet Take 1 tablet (500 mg total) by mouth every 6 (six) hours as needed for muscle spasms. 30 tablet 0  . XARELTO 20 MG TABS tablet TK 1 T PO QD  2   No current facility-administered medications on file prior to visit.     PAST MEDICAL HISTORY: Past Medical History:  Diagnosis Date  . Acute renal failure (ARF) ([place]) 01/2012  . Anxiety   . Arthritis   . Breast cancer in female [facility])    Right  . Cancer ([place])   . Cough   . Dry skin   . Dyspnea   . Dysrhythmia 01/2017   atrial fibrillation  . Fatigue   . Heart murmur    per pt report  . History of kidney stones   . History of MRSA infection 2013   leg, abdomen, arm  .  Hyperlipidemia 2018  . Hypertension   . Kidney stone 01/2017  . Leg cramping   . Leg pain   . Lumbar stenosis   . Nervousness   . Obesity 01/2017  . Palpitations   . Pyelonephritis   . Shortness of breath on exertion   . Stress   . Swelling of both lower extremities   . UTI (urinary tract infection)   . Vitamin D deficiency   . Weakness     PAST SURGICAL HISTORY: Past Surgical History:  Procedure Laterality Date  . BREAST BIOPSY Right 07/16/2017   pending path  . BREAST LUMPECTOMY WITH RADIOACTIVE SEED AND SENTINEL LYMPH NODE BIOPSY Right 09/01/2017   Procedure: BREAST LUMPECTOMY WITH RADIOACTIVE SEED AND SENTINEL LYMPH NODE BIOPSY;  Surgeon: Heather Overall, MD;  Location: [facility];  Service: General;  Laterality: Right;  .  CHOLECYSTECTOMY     1992  . DILATION AND CURETTAGE OF UTERUS    . EXTRACORPOREAL SHOCK WAVE LITHOTRIPSY Left 02/13/2017   Procedure: EXTRACORPOREAL SHOCK WAVE LITHOTRIPSY (ESWL);  Surgeon: Heather Cowper, MD;  Location: [facility];  Service: Urology;  Laterality: Left;  . FOOT FRACTURE SURGERY Right 2018   repair of trigonem of ankle. endoscopic fasciotomy  . KNEE ARTHROSCOPY Right 2013   meniscus  . KNEE SURGERY Right    knee scope for torn meniscus  . LUMBAR LAMINECTOMY/DECOMPRESSION MICRODISCECTOMY Bilateral 10/15/2017   Procedure: Laminectomy and Foraminotomy - Lumbar one-two, Lumbar two-three, Lumbar three-four  - bilateral;  Surgeon: Heather Moore, MD;  Location: [facility];  Service: Neurosurgery;  Laterality: Bilateral;    SOCIAL HISTORY: Social History   Tobacco Use  . Smoking status: Never Smoker  . Smokeless tobacco: Never Used  Substance Use Topics  . Alcohol use: No  . Drug use: No    FAMILY HISTORY: Family History  Problem Relation Age of Onset  . Heart disease Father        deceased 35; had 75 siblings  . Hypertension Father   . Kidney disease Father   . Colon cancer Paternal Uncle 37       deceased 59s  . Hypertension Paternal Uncle   . Diabetes Maternal Aunt   . Hypertension Maternal Aunt   . Heart disease Mother        deceased at 59; had 7 siblings  . Hypertension Mother   . Hyperlipidemia Mother   . Depression Mother   . Anxiety disorder Mother   . Sleep apnea Mother   . Obesity Mother   . Uterine cancer Sister 85       currently 24; no children  . Breast cancer Cousin 60       currently 8; daughter of unaffected maternal aunt    ROS: Review of Systems  Constitutional: Negative for weight loss.  Cardiovascular:       Negative for hypotension.  Neurological:       Negative for lightheadedness.  Psychiatric/Behavioral: Negative for suicidal ideas.       Negative for homicidal ideations.    PHYSICAL EXAM: Blood pressure 134/81, pulse (!)  55, temperature 98.1 F (36.7 C), temperature source Oral, height 5\' 2"  (1.575 m), weight 298 lb (135.2 kg), SpO2 97 %. Body mass index is 54.5 kg/m. Physical Exam  Constitutional: She is oriented to person, place, and time. She appears well-developed and well-nourished.  Cardiovascular: Normal rate.  Pulmonary/Chest: Effort normal.  Musculoskeletal: Normal range of motion.  Neurological: She is oriented to person, place, and time.  Skin:  Skin is warm and dry.  Psychiatric: She has a normal mood and affect. Her behavior is normal.  Vitals reviewed.   RECENT LABS AND TESTS: BMET    Component Value Date/Time   NA 139 11/25/2017 1106   NA 133 (L) 05/06/2011 2025   K 4.5 11/25/2017 1106   K 4.0 05/06/2011 2025   CL 100 11/25/2017 1106   CL 98 05/06/2011 2025   CO2 22 11/25/2017 1106   CO2 22 05/06/2011 2025   GLUCOSE 83 11/25/2017 1106   GLUCOSE 99 10/13/2017 0919   GLUCOSE 95 05/06/2011 2025   BUN 22 11/25/2017 1106   BUN 38 (H) 05/06/2011 2025   CREATININE 1.39 (H) 11/25/2017 1106   CREATININE 3.09 (H) 05/06/2011 2025   CALCIUM 9.6 11/25/2017 1106   CALCIUM 7.5 (L) 05/06/2011 2025   GFRNONAA 42 (L) 11/25/2017 1106   GFRNONAA 17 (L) 05/06/2011 2025   GFRAA 49 (L) 11/25/2017 1106   GFRAA 21 (L) 05/06/2011 2025   Lab Results  Component Value Date   HGBA1C 5.6 11/25/2017   Lab Results  Component Value Date   INSULIN 18.6 11/25/2017   CBC    Component Value Date/Time   WBC 8.6 12/09/2017 1511   RBC 3.97 12/09/2017 1511   HGB 12.3 12/09/2017 1511   HGB 13.6 11/25/2017 1106   HCT 36.1 12/09/2017 1511   HCT 41.3 11/25/2017 1106   PLT 244 12/09/2017 1511   PLT 318 02/24/2015 1352   MCV 91.0 12/09/2017 1511   MCV 92 11/25/2017 1106   MCV 90 05/06/2011 2025   MCH 31.0 12/09/2017 1511   MCHC 34.0 12/09/2017 1511   RDW 14.2 12/09/2017 1511   RDW 13.1 11/25/2017 1106   RDW 13.8 05/06/2011 2025   LYMPHSABS 1.8 11/25/2017 1106   MONOABS 0.5 10/13/2017 0919   EOSABS  0.2 11/25/2017 1106   BASOSABS 0.0 11/25/2017 1106   Iron/TIBC/Ferritin/ %Sat No results found for: IRON, TIBC, FERRITIN, IRONPCTSAT Lipid Panel     Component Value Date/Time   CHOL 184 11/25/2017 1106   TRIG 77 11/25/2017 1106   HDL 48 11/25/2017 1106   LDLCALC 121 (H) 11/25/2017 1106   Hepatic Function Panel     Component Value Date/Time   PROT 7.0 11/25/2017 1106   PROT 6.3 (L) 05/06/2011 2025   ALBUMIN 4.3 11/25/2017 1106   ALBUMIN 2.8 (L) 05/06/2011 2025   AST 13 11/25/2017 1106   AST 28 05/06/2011 2025   ALT 12 11/25/2017 1106   ALT 22 05/06/2011 2025   ALKPHOS 94 11/25/2017 1106   ALKPHOS 76 05/06/2011 2025   BILITOT 0.7 11/25/2017 1106   BILITOT 3.1 (H) 05/06/2011 2025   BILIDIR 0.3 06/02/2017 1736   BILIDIR 0.11 02/24/2015 1352   IBILI 0.6 06/02/2017 1736      Component Value Date/Time   TSH 2.430 11/25/2017 1106   Results for TREONNA, KLEE (MRN [id number]) as of 12/10/2017 10:00  Ref. Range 11/25/2017 11:06  Vitamin D, 25-Hydroxy Latest Ref Range: 30.0 - 100.0 ng/mL 51.6   ASSESSMENT AND PLAN: Essential hypertension  CKD (chronic kidney disease) stage 3, GFR 30-59 ml/min (HCC)  PLAN:  Hypertension We discussed sodium restriction, working on healthy weight loss, and a regular exercise program as the means to achieve improved blood pressure control. Heather Conrad agreed with this plan and agreed to follow up as directed. We will continue to monitor her blood pressure as well as her progress with the above lifestyle modifications. She agrees to continue her  medications as prescribed for hypertension, decrease her sodium intake, and will watch for signs of hypotension as she continues her lifestyle modifications. Heather Conrad agrees to follow up as directed in 2 weeks.   Chronic Kidney Disease Class 3B Heather Conrad will minimize diuretics (Lasix) and consider changes in Meloxicam. She will talk with her orthopedist about changing her medication.  Depression with Emotional Eating  Behaviors We discussed cognitive behavior therapy modification techniques today to help Heather Conrad deal with her emotional eating and depression.   Obesity Heather Conrad is currently in the action stage of change. As such, her goal is to continue with weight loss efforts. She has agreed to change to the Category 2 plan. We will discuss palatable foods, since she has a decreased appetite due to radiation. She could not get in all the food in Category 3 with radiation daily for breast cancer. Heather Conrad has been instructed to work up to a goal of 150 minutes of combined cardio and strengthening exercise per week for weight loss and Conrad health benefits. We discussed the following Behavioral Modification Strategies today: increasing lean protein intake, decreasing simple carbohydrates, increasing vegetables, increase H2O intake, decreasing sodium intake, no skipping meals, work on meal planning and easy cooking plans, and keeping healthy foods in the home.  Heather Conrad has agreed to follow up with our clinic in 2 weeks. She was informed of the importance of frequent follow up visits to maximize her success with intensive lifestyle modifications for her multiple health conditions.  OBESITY BEHAVIORAL INTERVENTION VISIT  Today's visit was # 2   Starting weight: 291 lbs Starting date: 11/25/17 Today's weight : Weight: 298 lb (135.2 kg)  Today's date: 12/09/2017 Total lbs lost to date: 0  ASK: We discussed the diagnosis of obesity with Heather Conrad today and Heather Conrad agreed to give [place] permission to discuss obesity behavioral modification therapy today.  ASSESS: Heather Conrad has the diagnosis of obesity and her BMI today is 54.49. Heather Conrad is in the action stage of change.   ADVISE: Heather Conrad was educated on the multiple health risks of obesity as well as the benefit of weight loss to improve her health. She was advised of the need for long term treatment and the importance of lifestyle modifications to improve her current health  and to decrease her risk of future health problems.  AGREE: Multiple dietary modification options and treatment options were discussed and Heather Conrad agreed to follow the recommendations documented in the above note.  ARRANGE: Heather Conrad was educated on the importance of frequent visits to treat obesity as outlined per CMS and USPSTF guidelines and agreed to schedule her next follow up appointment today.  I, Heather Conrad Blanco, am acting as Location manager for [employer]. Owens Shark, DO  I have reviewed the above documentation for accuracy and completeness, and I agree with the above. -Jearld Lesch, DO

## 2017-12-11 ENCOUNTER — Ambulatory Visit
Admission: RE | Admit: 2017-12-11 | Discharge: 2017-12-11 | Disposition: A | Discharge status: Home / Self Care | Payer: 59 | Source: Ambulatory Visit | Attending: Radiation Oncology | Admitting: Radiation Oncology

## 2017-12-11 DIAGNOSIS — C50911 Malignant neoplasm of unspecified site of right female breast: Secondary | ICD-10-CM | POA: Diagnosis not present

## 2017-12-12 ENCOUNTER — Ambulatory Visit
Admission: RE | Admit: 2017-12-12 | Discharge: 2017-12-12 | Disposition: A | Discharge status: Home / Self Care | Payer: 59 | Source: Ambulatory Visit | Attending: Radiation Oncology | Admitting: Radiation Oncology

## 2017-12-12 DIAGNOSIS — C50911 Malignant neoplasm of unspecified site of right female breast: Secondary | ICD-10-CM | POA: Diagnosis not present

## 2017-12-15 ENCOUNTER — Ambulatory Visit: Payer: 59

## 2017-12-16 ENCOUNTER — Ambulatory Visit: Payer: 59

## 2017-12-17 ENCOUNTER — Ambulatory Visit
Admission: RE | Admit: 2017-12-17 | Discharge: 2017-12-17 | Disposition: A | Discharge status: Home / Self Care | Payer: 59 | Source: Ambulatory Visit | Attending: Radiation Oncology | Admitting: Radiation Oncology

## 2017-12-17 DIAGNOSIS — C50911 Malignant neoplasm of unspecified site of right female breast: Secondary | ICD-10-CM | POA: Insufficient documentation

## 2017-12-17 DIAGNOSIS — Z170 Estrogen receptor positive status [ER+]: Secondary | ICD-10-CM | POA: Diagnosis present

## 2017-12-18 ENCOUNTER — Ambulatory Visit
Admission: RE | Admit: 2017-12-18 | Discharge: 2017-12-18 | Disposition: A | Discharge status: Home / Self Care | Payer: 59 | Source: Ambulatory Visit | Attending: Radiation Oncology | Admitting: Radiation Oncology

## 2017-12-18 DIAGNOSIS — C50911 Malignant neoplasm of unspecified site of right female breast: Secondary | ICD-10-CM | POA: Diagnosis not present

## 2017-12-19 ENCOUNTER — Ambulatory Visit
Admission: RE | Admit: 2017-12-19 | Discharge: 2017-12-19 | Disposition: A | Discharge status: Home / Self Care | Payer: 59 | Source: Ambulatory Visit | Attending: Radiation Oncology | Admitting: Radiation Oncology

## 2017-12-19 DIAGNOSIS — C50911 Malignant neoplasm of unspecified site of right female breast: Secondary | ICD-10-CM | POA: Diagnosis not present

## 2017-12-20 ENCOUNTER — Ambulatory Visit: Payer: 59

## 2017-12-22 ENCOUNTER — Ambulatory Visit
Admission: RE | Admit: 2017-12-22 | Discharge: 2017-12-22 | Disposition: A | Discharge status: Home / Self Care | Payer: 59 | Source: Ambulatory Visit | Attending: Radiation Oncology | Admitting: Radiation Oncology

## 2017-12-22 ENCOUNTER — Ambulatory Visit: Payer: 59

## 2017-12-22 DIAGNOSIS — C50911 Malignant neoplasm of unspecified site of right female breast: Secondary | ICD-10-CM | POA: Diagnosis not present

## 2017-12-23 ENCOUNTER — Ambulatory Visit (INDEPENDENT_AMBULATORY_CARE_PROVIDER_SITE_OTHER): Payer: 59 | Admitting: Bariatrics

## 2017-12-23 ENCOUNTER — Other Ambulatory Visit: Payer: Self-pay | Admitting: *Deleted

## 2017-12-23 ENCOUNTER — Inpatient Hospital Stay: Payer: 59 | Attending: Radiation Oncology

## 2017-12-23 ENCOUNTER — Ambulatory Visit
Admission: RE | Admit: 2017-12-23 | Discharge: 2017-12-23 | Disposition: A | Discharge status: Home / Self Care | Payer: 59 | Source: Ambulatory Visit | Attending: Radiation Oncology | Admitting: Radiation Oncology

## 2017-12-23 ENCOUNTER — Ambulatory Visit: Payer: 59

## 2017-12-23 VITALS — BP 119/77 | HR 55 | Ht 62.0 in | Wt 295.0 lb

## 2017-12-23 DIAGNOSIS — Z6841 Body Mass Index (BMI) 40.0 and over, adult: Secondary | ICD-10-CM

## 2017-12-23 DIAGNOSIS — Z170 Estrogen receptor positive status [ER+]: Secondary | ICD-10-CM | POA: Insufficient documentation

## 2017-12-23 DIAGNOSIS — C50411 Malignant neoplasm of upper-outer quadrant of right female breast: Secondary | ICD-10-CM | POA: Insufficient documentation

## 2017-12-23 DIAGNOSIS — I10 Essential (primary) hypertension: Secondary | ICD-10-CM

## 2017-12-23 DIAGNOSIS — E559 Vitamin D deficiency, unspecified: Secondary | ICD-10-CM

## 2017-12-23 DIAGNOSIS — C50911 Malignant neoplasm of unspecified site of right female breast: Secondary | ICD-10-CM | POA: Diagnosis not present

## 2017-12-23 MED ORDER — SILVER SULFADIAZINE 1 % EX CREA: 1.0000 "application " | TOPICAL_CREAM | Freq: Every day | CUTANEOUS | 0 refills | Status: DC

## 2017-12-24 ENCOUNTER — Ambulatory Visit
Admission: RE | Admit: 2017-12-24 | Discharge: 2017-12-24 | Disposition: A | Discharge status: Home / Self Care | Payer: 59 | Source: Ambulatory Visit | Attending: Radiation Oncology | Admitting: Radiation Oncology

## 2017-12-24 DIAGNOSIS — C50911 Malignant neoplasm of unspecified site of right female breast: Secondary | ICD-10-CM | POA: Diagnosis not present

## 2017-12-24 NOTE — Progress Notes (Signed)
Office: [phone]  /  Fax: [phone]   HPI:   Chief Complaint: OBESITY Heather Conrad is here to discuss her progress with her obesity treatment plan. She is on the Category 2 plan and is following her eating plan approximately 85 to 90 % of the time. She states she is exercising 0 minutes 0 times per week. Heather Conrad is "discouraged" going through radiation five times a week. Her last treatment is on 01/15/18. Heather Conrad has an increase in fatigue overall. She has a decrease in appetite and even more fatigue in the evening. Her weight is 295 lb (133.8 kg) today and has had a weight loss of 3 pounds over a period of 2 weeks since her last visit. She has lost 0 lbs since starting treatment with [place].  Hypertension Heather Conrad is a 57 y.o. female with hypertension. She is currently taking Lisinopril-HCTZ and Furosemide. Heather Conrad denies lightheadedness. She is working weight loss to help control her blood pressure with the goal of decreasing her risk of heart attack and stroke. Heather Conrad blood pressure is well controlled.  Vitamin D deficiency Heather Conrad has a diagnosis of vitamin D deficiency. She is currently taking high dose prescription vit D and denies nausea, vomiting or muscle weakness.  ALLERGIES: No Known Allergies  MEDICATIONS: Current Outpatient Medications on File Prior to Visit  Medication Sig Dispense Refill  . amiodarone (PACERONE) 200 MG tablet Take 200 mg by mouth 2 (two) times a week. Sunday and Wednesday    . Cholecalciferol (VITAMIN D3) 50000 units CAPS Take 50,000 Units by mouth every Sunday.   2  . citalopram (CELEXA) 40 MG tablet Take 40 mg by mouth daily.  6  . furosemide (LASIX) 20 MG tablet Take 20 mg by mouth daily.  2  . HYDROcodone-acetaminophen (NORCO) 7.5-325 MG tablet Take 1 tablet by mouth every 6 (six) hours. 30 tablet 0  . lisinopril-hydrochlorothiazide (PRINZIDE,ZESTORETIC) 10-12.5 MG tablet Take 1 tablet by mouth daily.  2  . meloxicam (MOBIC) 15 MG tablet Take 15  mg by mouth daily.    . methocarbamol (ROBAXIN) 500 MG tablet Take 1 tablet (500 mg total) by mouth every 6 (six) hours as needed for muscle spasms. 30 tablet 0  . XARELTO 20 MG TABS tablet TK 1 T PO QD  2   No current facility-administered medications on file prior to visit.     PAST MEDICAL HISTORY: Past Medical History:  Diagnosis Date  . Acute renal failure (ARF) ([place]) 01/2012  . Anxiety   . Arthritis   . Breast cancer in female [facility])    Right  . Cancer ([place])   . Cough   . Dry skin   . Dyspnea   . Dysrhythmia 01/2017   atrial fibrillation  . Fatigue   . Heart murmur    per pt report  . History of kidney stones   . History of MRSA infection 2013   leg, abdomen, arm  . Hyperlipidemia 2018  . Hypertension   . Kidney stone 01/2017  . Leg cramping   . Leg pain   . Lumbar stenosis   . Nervousness   . Obesity 01/2017  . Palpitations   . Pyelonephritis   . Shortness of breath on exertion   . Stress   . Swelling of both lower extremities   . UTI (urinary tract infection)   . Vitamin D deficiency   . Weakness     PAST SURGICAL HISTORY: Past Surgical History:  Procedure Laterality Date  .  BREAST BIOPSY Right 07/16/2017   pending path  . BREAST LUMPECTOMY WITH RADIOACTIVE SEED AND SENTINEL LYMPH NODE BIOPSY Right 09/01/2017   Procedure: BREAST LUMPECTOMY WITH RADIOACTIVE SEED AND SENTINEL LYMPH NODE BIOPSY;  Surgeon: Alphonsa Overall, MD;  Location: [place];  Service: General;  Laterality: Right;  . CHOLECYSTECTOMY     1992  . DILATION AND CURETTAGE OF UTERUS    . EXTRACORPOREAL SHOCK WAVE LITHOTRIPSY Left 02/13/2017   Procedure: EXTRACORPOREAL SHOCK WAVE LITHOTRIPSY (ESWL);  Surgeon: Royston Cowper, MD;  Location: [facility];  Service: Urology;  Laterality: Left;  . FOOT FRACTURE SURGERY Right 2018   repair of trigonem of ankle. endoscopic fasciotomy  . KNEE ARTHROSCOPY Right 2013   meniscus  . KNEE SURGERY Right    knee scope for torn meniscus  . LUMBAR  LAMINECTOMY/DECOMPRESSION MICRODISCECTOMY Bilateral 10/15/2017   Procedure: Laminectomy and Foraminotomy - Lumbar one-two, Lumbar two-three, Lumbar three-four  - bilateral;  Surgeon: Eustace Moore, MD;  Location: [place];  Service: Neurosurgery;  Laterality: Bilateral;    SOCIAL HISTORY: Social History   Tobacco Use  . Smoking status: Never Smoker  . Smokeless tobacco: Never Used  Substance Use Topics  . Alcohol use: No  . Drug use: No    FAMILY HISTORY: Family History  Problem Relation Age of Onset  . Heart disease Father        deceased 40; had 26 siblings  . Hypertension Father   . Kidney disease Father   . Colon cancer Paternal Uncle 83       deceased 82s  . Hypertension Paternal Uncle   . Diabetes Maternal Aunt   . Hypertension Maternal Aunt   . Heart disease Mother        deceased at 4; had 7 siblings  . Hypertension Mother   . Hyperlipidemia Mother   . Depression Mother   . Anxiety disorder Mother   . Sleep apnea Mother   . Obesity Mother   . Uterine cancer Sister 15       currently 60; no children  . Breast cancer Cousin 40       currently 7; daughter of unaffected maternal aunt    ROS: Review of Systems  Constitutional: Positive for weight loss.  Gastrointestinal: Negative for nausea and vomiting.  Musculoskeletal:       Negative for muscle weakness  Neurological:       Negative for lightheadedness    PHYSICAL EXAM: Blood pressure 119/77, pulse (!) 55, height 5\' 2"  (1.575 m), weight 295 lb (133.8 kg), SpO2 97 %. Body mass index is 53.96 kg/m. Physical Exam  Constitutional: She is oriented to person, place, and time. She appears well-developed and well-nourished.  Cardiovascular: Normal rate.  Pulmonary/Chest: Effort normal.  Musculoskeletal: Normal range of motion.  Neurological: She is oriented to person, place, and time.  Skin: Skin is warm and dry.  Psychiatric: She has a normal mood and affect. Her behavior is normal.  Vitals  reviewed.   RECENT LABS AND TESTS: BMET    Component Value Date/Time   NA 139 11/25/2017 1106   NA 133 (L) 05/06/2011 2025   K 4.5 11/25/2017 1106   K 4.0 05/06/2011 2025   CL 100 11/25/2017 1106   CL 98 05/06/2011 2025   CO2 22 11/25/2017 1106   CO2 22 05/06/2011 2025   GLUCOSE 83 11/25/2017 1106   GLUCOSE 99 10/13/2017 0919   GLUCOSE 95 05/06/2011 2025   BUN 22 11/25/2017 1106   BUN  38 (H) 05/06/2011 2025   CREATININE 1.39 (H) 11/25/2017 1106   CREATININE 3.09 (H) 05/06/2011 2025   CALCIUM 9.6 11/25/2017 1106   CALCIUM 7.5 (L) 05/06/2011 2025   GFRNONAA 42 (L) 11/25/2017 1106   GFRNONAA 17 (L) 05/06/2011 2025   GFRAA 49 (L) 11/25/2017 1106   GFRAA 21 (L) 05/06/2011 2025   Lab Results  Component Value Date   HGBA1C 5.6 11/25/2017   Lab Results  Component Value Date   INSULIN 18.6 11/25/2017   CBC    Component Value Date/Time   WBC 8.6 12/09/2017 1511   RBC 3.97 12/09/2017 1511   HGB 12.3 12/09/2017 1511   HGB 13.6 11/25/2017 1106   HCT 36.1 12/09/2017 1511   HCT 41.3 11/25/2017 1106   PLT 244 12/09/2017 1511   PLT 318 02/24/2015 1352   MCV 91.0 12/09/2017 1511   MCV 92 11/25/2017 1106   MCV 90 05/06/2011 2025   MCH 31.0 12/09/2017 1511   MCHC 34.0 12/09/2017 1511   RDW 14.2 12/09/2017 1511   RDW 13.1 11/25/2017 1106   RDW 13.8 05/06/2011 2025   LYMPHSABS 1.8 11/25/2017 1106   MONOABS 0.5 10/13/2017 0919   EOSABS 0.2 11/25/2017 1106   BASOSABS 0.0 11/25/2017 1106   Iron/TIBC/Ferritin/ %Sat No results found for: IRON, TIBC, FERRITIN, IRONPCTSAT Lipid Panel     Component Value Date/Time   CHOL 184 11/25/2017 1106   TRIG 77 11/25/2017 1106   HDL 48 11/25/2017 1106   LDLCALC 121 (H) 11/25/2017 1106   Hepatic Function Panel     Component Value Date/Time   PROT 7.0 11/25/2017 1106   PROT 6.3 (L) 05/06/2011 2025   ALBUMIN 4.3 11/25/2017 1106   ALBUMIN 2.8 (L) 05/06/2011 2025   AST 13 11/25/2017 1106   AST 28 05/06/2011 2025   ALT 12 11/25/2017  1106   ALT 22 05/06/2011 2025   ALKPHOS 94 11/25/2017 1106   ALKPHOS 76 05/06/2011 2025   BILITOT 0.7 11/25/2017 1106   BILITOT 3.1 (H) 05/06/2011 2025   BILIDIR 0.3 06/02/2017 1736   BILIDIR 0.11 02/24/2015 1352   IBILI 0.6 06/02/2017 1736      Component Value Date/Time   TSH 2.430 11/25/2017 1106   Results for JASLYNE, BEECK (MRN [id number]) as of 12/24/2017 11:51  Ref. Range 11/25/2017 11:06  Vitamin D, 25-Hydroxy Latest Ref Range: 30.0 - 100.0 ng/mL 51.6   ASSESSMENT AND PLAN: Essential hypertension  Vitamin D deficiency  Class 3 severe obesity with serious comorbidity and body mass index (BMI) of 50.0 to 59.9 in adult, unspecified obesity type ([place])  PLAN:  Hypertension We discussed sodium restriction, working on healthy weight loss, and a regular exercise program as the means to achieve improved blood pressure control. Vita agreed with this plan and agreed to follow up as directed. We will continue to monitor her blood pressure as well as her progress with the above lifestyle modifications. She will continue her medications as prescribed and will watch for signs of hypotension as she continues her lifestyle modifications.  Vitamin D Deficiency Heather Conrad was informed that low vitamin D levels contributes to fatigue and are associated with obesity, breast, and colon cancer. She agrees to continue to take high dose prescription Vit D @50 ,000 IU every week and will follow up for routine testing of vitamin D, at least 2-3 times per year. She was informed of the risk of over-replacement of vitamin D and agrees to not increase her dose unless she discusses this with [place]  first.  I spent > than 50% of the 15 minute visit on counseling as documented in the note.  Obesity Heather Conrad is currently in the action stage of change. As such, her goal is to continue with weight loss efforts She has agreed to follow the Category 2 plan Heather Conrad has been instructed to work up to a goal of 150 minutes of  combined cardio and strengthening exercise per week for weight loss and overall health benefits. We discussed the following Behavioral Modification Strategies today:  increase H2O intake, no skipping meals, increasing lean protein intake, decreasing simple carbohydrates , increasing vegetables, decrease eating out and work on meal planning and easy cooking plans  Heather Conrad will keep the proper snacks at work. Heather Conrad, 1 [employer], [employer] energy drink in the morning and she will increase water in the morning.  Heather Conrad has agreed to follow up with our clinic in 2 weeks. She was informed of the importance of frequent follow up visits to maximize her success with intensive lifestyle modifications for her multiple health conditions.   OBESITY BEHAVIORAL INTERVENTION VISIT  Today's visit was # 3   Starting weight: 291 lbs Starting date: 11/25/17 Today's weight : 295 lbs  Today's date: 12/23/2017 Total lbs lost to date: 0   ASK: We discussed the diagnosis of obesity with Heather Conrad today and Heather Conrad agreed to give [place] permission to discuss obesity behavioral modification therapy today.  ASSESS: Heather Conrad has the diagnosis of obesity and her BMI today is 53.94 Heather Conrad is in the action stage of change   ADVISE: Heather Conrad was educated on the multiple health risks of obesity as well as the benefit of weight loss to improve her health. She was advised of the need for long term treatment and the importance of lifestyle modifications to improve her current health and to decrease her risk of future health problems.  AGREE: Multiple dietary modification options and treatment options were discussed and  Jailani agreed to follow the recommendations documented in the above note.  ARRANGE: Heather Conrad was educated on the importance of frequent visits to treat obesity as outlined per CMS and USPSTF guidelines and agreed to schedule her next follow up appointment today.  Corey Skains, am acting as Location manager for [employer]. Owens Shark, DO  I have reviewed the above documentation for accuracy and completeness, and I agree with the above. -Jearld Lesch, DO

## 2017-12-25 ENCOUNTER — Ambulatory Visit
Admission: RE | Admit: 2017-12-25 | Discharge: 2017-12-25 | Disposition: A | Discharge status: Home / Self Care | Payer: 59 | Source: Ambulatory Visit | Attending: Radiation Oncology | Admitting: Radiation Oncology

## 2017-12-25 DIAGNOSIS — C50911 Malignant neoplasm of unspecified site of right female breast: Secondary | ICD-10-CM | POA: Diagnosis not present

## 2017-12-26 ENCOUNTER — Ambulatory Visit
Admission: RE | Admit: 2017-12-26 | Discharge: 2017-12-26 | Disposition: A | Discharge status: Home / Self Care | Payer: 59 | Source: Ambulatory Visit | Attending: Radiation Oncology | Admitting: Radiation Oncology

## 2017-12-26 DIAGNOSIS — C50911 Malignant neoplasm of unspecified site of right female breast: Secondary | ICD-10-CM | POA: Diagnosis not present

## 2017-12-29 ENCOUNTER — Ambulatory Visit
Admission: RE | Admit: 2017-12-29 | Discharge: 2017-12-29 | Disposition: A | Discharge status: Home / Self Care | Payer: 59 | Source: Ambulatory Visit | Attending: Radiation Oncology | Admitting: Radiation Oncology

## 2017-12-29 DIAGNOSIS — C50911 Malignant neoplasm of unspecified site of right female breast: Secondary | ICD-10-CM | POA: Diagnosis not present

## 2017-12-30 ENCOUNTER — Ambulatory Visit
Admission: RE | Admit: 2017-12-30 | Discharge: 2017-12-30 | Disposition: A | Discharge status: Home / Self Care | Payer: 59 | Source: Ambulatory Visit | Attending: Radiation Oncology | Admitting: Radiation Oncology

## 2017-12-30 DIAGNOSIS — C50911 Malignant neoplasm of unspecified site of right female breast: Secondary | ICD-10-CM | POA: Diagnosis not present

## 2017-12-31 ENCOUNTER — Ambulatory Visit
Admission: RE | Admit: 2017-12-31 | Discharge: 2017-12-31 | Disposition: A | Discharge status: Home / Self Care | Payer: 59 | Source: Ambulatory Visit | Attending: Radiation Oncology | Admitting: Radiation Oncology

## 2017-12-31 DIAGNOSIS — C50911 Malignant neoplasm of unspecified site of right female breast: Secondary | ICD-10-CM | POA: Diagnosis not present

## 2018-01-01 ENCOUNTER — Ambulatory Visit: Payer: 59

## 2018-01-01 ENCOUNTER — Ambulatory Visit
Admission: RE | Admit: 2018-01-01 | Discharge: 2018-01-01 | Disposition: A | Discharge status: Home / Self Care | Payer: 59 | Source: Ambulatory Visit | Attending: Radiation Oncology | Admitting: Radiation Oncology

## 2018-01-01 DIAGNOSIS — C50911 Malignant neoplasm of unspecified site of right female breast: Secondary | ICD-10-CM | POA: Diagnosis not present

## 2018-01-02 ENCOUNTER — Ambulatory Visit
Admission: RE | Admit: 2018-01-02 | Discharge: 2018-01-02 | Disposition: A | Discharge status: Home / Self Care | Payer: 59 | Source: Ambulatory Visit | Attending: Radiation Oncology | Admitting: Radiation Oncology

## 2018-01-02 ENCOUNTER — Ambulatory Visit: Payer: 59

## 2018-01-02 DIAGNOSIS — C50911 Malignant neoplasm of unspecified site of right female breast: Secondary | ICD-10-CM | POA: Diagnosis not present

## 2018-01-03 ENCOUNTER — Ambulatory Visit: Payer: 59

## 2018-01-05 ENCOUNTER — Ambulatory Visit: Payer: 59

## 2018-01-05 ENCOUNTER — Ambulatory Visit
Admission: RE | Admit: 2018-01-05 | Discharge: 2018-01-05 | Disposition: A | Discharge status: Home / Self Care | Payer: 59 | Source: Ambulatory Visit | Attending: Radiation Oncology | Admitting: Radiation Oncology

## 2018-01-05 DIAGNOSIS — C50911 Malignant neoplasm of unspecified site of right female breast: Secondary | ICD-10-CM | POA: Diagnosis not present

## 2018-01-05 NOTE — Progress Notes (Addendum)
Gynecology Annual Exam  PCP: Perrin Maltese, MD  Chief Complaint:  Chief Complaint  Patient presents with  . Gynecologic Exam    iud removal    History of Present Illness:Heather Conrad is a 57 year old Caucasian/White female, [place], who presents for her gyn exam. She is having no significant gyn problems  Her menses have been absent due to a Mirena IUD that was inserted in 2014, and she had an [facility] and [place] in 2017 that were in the menopausal range. She would like her IUD removed today.   The patient's past medical history is notable for hypertension, atrial fibrillation, a history of MRSA on the skin of her legs, arms, abdomen in 2013. She was hospitalized in 2013 with renal calculus, dehydration and acute renal failure.  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 She is now receiving radiation therapy. She also had a laminectomy 10/15/2017 of L1-2, L2-3, and L3-4. She had another episode of renal stones and had lithotripsy last year. She also had pyelonephritis in March 2019. Over the past 2 years she has gained 35# and is currently going to the [facility] program- and following their diet plan to try to lose weight. She has been limited in exercise because of her back and knee problems. Has pain in her left knee>right knee.  She is not sexually active. She has been sexually active in the past but not currently. Had her last Mirena inserted 12/22/2012  Her most recent pap smear was obtained 07/28/2015 and was NIL/negative HRHPV. Hx of abnormal Pap smears/ positive HRHPV and had a normal colposcopy in 2012. Paps since 2014 have been normal.  Her most recent mammogram obtained on 07/04/17 and was Bir[place] 4 for a suspicious mass in the right breast which led to her breast biopsy and diagnosis of breast cancer  There is a family history of breast cancer in a maternal  aunt There is no family history of ovarian cancer.  She has not had a colonoscopy and is eligible  The patient does do self breast exams.  The patient does not smoke.  The patient rarely drinks alcohol.  The patient does not use illegal drugs.  The patient does not exercise. She just returned to work after her back surgery. The patient may get adequate calcium in her diet.  She had a recent cholesterol screen in 2019 that was OK. Her most recent creatinine is 1.39.    Review of Systems: Review of Systems  Constitutional: Positive for malaise/fatigue (and change in appetite). Negative for chills, fever and weight loss.       Positive for weight gain  HENT: Negative for congestion, sinus pain and sore throat.   Eyes: Negative for blurred vision and pain.  Respiratory: Negative for hemoptysis, shortness of breath and wheezing.   Cardiovascular: Negative for chest pain, palpitations and leg swelling.  Gastrointestinal: Negative for abdominal pain, blood in stool, diarrhea, heartburn, nausea and vomiting.  Genitourinary: Negative for dysuria, frequency, hematuria and urgency.       Positive for amenorrhea  Musculoskeletal: Positive for back pain and joint pain (knees). Negative for myalgias.  Skin: Positive for itching and rash (right breast is red from radiation therapy).  Neurological: Negative for dizziness, tingling and headaches.  Endo/Heme/Allergies: Negative for environmental allergies and polydipsia. Does not bruise/bleed easily.  Negative for hot flashes   Psychiatric/Behavioral: Negative for depression. The patient is not nervous/anxious and does not have insomnia.   Breasts: tenderness   Past Medical History:  Past Medical History:  Diagnosis Date  . Acute renal failure (ARF) ([place]) 01/2012  . Anxiety   . Arthritis 2003   SPINE AND RIGHT KNEE  . Breast cancer in female [facility]) 07/16/2017   Right-invasive mammary cancer  . Cancer ([place])   . Cough   . Dry skin   .  Dyspnea   . Dysrhythmia 01/2017   atrial fibrillation  . Fatigue   . Heart murmur    per pt report  . History of kidney stones   . History of MRSA infection 2013   leg, abdomen, arm  . HPV in female   . Hyperlipidemia 2018  . Hypertension   . Kidney stone 01/2017  . Leg cramping   . Leg pain   . Lumbar stenosis   . Nervousness   . Obesity 01/2017  . Palpitations   . Pyelonephritis   . Shortness of breath on exertion   . Stress   . Swelling of both lower extremities   . UTI (urinary tract infection)   . Vitamin D deficiency   . Weakness     Past Surgical History:  Past Surgical History:  Procedure Laterality Date  . BREAST BIOPSY Right 07/16/2017   pending path  . BREAST LUMPECTOMY WITH RADIOACTIVE SEED AND SENTINEL LYMPH NODE BIOPSY Right 09/01/2017   Procedure: BREAST LUMPECTOMY WITH RADIOACTIVE SEED AND SENTINEL LYMPH NODE BIOPSY;  Surgeon: Alphonsa Overall, MD;  Location: [place];  Service: General;  Laterality: Right;  . CHOLECYSTECTOMY     1992  . DILATION AND CURETTAGE OF UTERUS    . EXTRACORPOREAL SHOCK WAVE LITHOTRIPSY Left 02/13/2017   Procedure: EXTRACORPOREAL SHOCK WAVE LITHOTRIPSY (ESWL);  Surgeon: Royston Cowper, MD;  Location: [facility];  Service: Urology;  Laterality: Left;  . FOOT FRACTURE SURGERY Right 2018   repair of trigonem of ankle. endoscopic fasciotomy  . INDUCED ABORTION    . KNEE SURGERY Right    knee scope for torn meniscus  . LUMBAR LAMINECTOMY/DECOMPRESSION MICRODISCECTOMY Bilateral 10/15/2017   Procedure: Laminectomy and Foraminotomy - Lumbar one-two, Lumbar two-three, Lumbar three-four  - bilateral;  Surgeon: Eustace Moore, MD;  Location: [place];  Service: Neurosurgery;  Laterality: Bilateral;    Family History:  Family History  Problem Relation Age of Onset  . Heart disease Father        deceased 67; had 91 siblings  . Hypertension Father   . Kidney disease Father   . Colon cancer Paternal Uncle 75       deceased 75s  . Hypertension  Paternal Uncle   . Diabetes Maternal Aunt   . Hypertension Maternal Aunt   . Heart disease Mother        deceased at 68; had 7 siblings  . Hypertension Mother   . Hyperlipidemia Mother   . Depression Mother   . Anxiety disorder Mother   . Sleep apnea Mother   . Obesity Mother   . Uterine cancer Sister 40       currently 13; no children  . Breast cancer Cousin 12       currently 27; daughter of unaffected maternal aunt    Social History:  Social History   Socioeconomic History  . Marital status: Single    Spouse name: Not on file  . Number of children: 2  .  Years of education: 58  . Highest education level: Not on file  Occupational History  . Occupation: Loss adjuster, chartered  Social Needs  . Financial resource strain: Not on file  . Food insecurity:    Worry: Not on file    Inability: Not on file  . Transportation needs:    Medical: Not on file    Non-medical: Not on file  Tobacco Use  . Smoking status: Never Smoker  . Smokeless tobacco: Never Used  Substance and Sexual Activity  . Alcohol use: Yes    Comment: rare  . Drug use: No  . Sexual activity: Not Currently  Lifestyle  . Physical activity:    Days per week: Not on file    Minutes per session: Not on file  . Stress: Not on file  Relationships  . Social connections:    Talks on phone: Not on file    Gets together: Not on file    Attends religious service: Not on file    Active member of club or organization: Not on file    Attends meetings of clubs or organizations: Not on file    Relationship status: Not on file  . Intimate partner violence:    Fear of current or ex partner: Not on file    Emotionally abused: Not on file    Physically abused: Not on file    Forced sexual activity: Not on file  Other Topics Concern  . Not on file  Social History Narrative  . Not on file    Allergies:  No Known Allergies  Medications:  Current Outpatient Medications on File Prior to Visit  Medication Sig  Dispense Refill  . amiodarone (PACERONE) 200 MG tablet Take 200 mg by mouth 2 (two) times a week. Sunday and Wednesday    . Cholecalciferol (VITAMIN D3) 50000 units CAPS Take 50,000 Units by mouth every Sunday.   2  . citalopram (CELEXA) 40 MG tablet Take 40 mg by mouth daily.  6  . fluticasone (FLONASE) 50 MCG/ACT nasal spray Place 1 spray into both nostrils as needed.  6  . furosemide (LASIX) 20 MG tablet Take 20 mg by mouth daily.  2  . lisinopril-hydrochlorothiazide (PRINZIDE,ZESTORETIC) 10-12.5 MG tablet Take 1 tablet by mouth daily.  2  . meloxicam (MOBIC) 15 MG tablet Take 15 mg by mouth daily.    . silver sulfADIAZINE (SILVADENE) 1 % cream Apply 1 application topically daily. 50 g 0  . XARELTO 20 MG TABS tablet TK 1 T PO QD  2   No current facility-administered medications on file prior to visit.    Physical Exam Vitals: BP (!) 150/74   Pulse 60   Ht 5\' 2"  (1.575 m)   Wt (!) 305 lb 12 oz (138.7 kg)   LMP  (LMP Unknown)   BMI 55.92 kg/m   General: pleasant, obese, WF in NAD HEENT: normocephalic, anicteric Neck: no thyroid enlargement, no palpable nodules, no cervical lymphadenopathy  Pulmonary: No increased work of breathing, CTAB Cardiovascular: RRR, without murmur  Breast: Right breast:  S/p upper outer quadrant lumpectomy, erythema from radiation present,  no skin or nipple retraction present, no nipple discharge. Left breast: no masses, no skin or nipple changes.  No axillary, infraclavicular or supraclavicular lymphadenopathy bilaterally. Abdomen: Soft, obese,  non-tender, non-distended.  Umbilicus without lesions.  No hepatomegaly or masses palpable. Genitourinary  External: Normal external female genitalia.  Normal urethral meatus, normal  Bartholin's and Skene's glands.    Vagina: Normal vaginal  mucosa, no evidence of prolapse.    Cervix: Grossly normal in appearance, no bleeding, non-tender. IUD strings visible at cervical os. IUD strings grasped with forceps and IUD  removed intact.  Uterus: Anteverted, normal size, shape, and consistency, mobile, and non-tender  Adnexa: No adnexal masses, non-tender. Difficult exam due to obesity  Rectal: deferred  Lymphatic: no evidence of inguinal lymphadenopathy Extremities: no edema, erythema, or tenderness Neurologic: Grossly intact Psychiatric: mood appropriate, affect full     Assessment: 57 y.o. [id number] postmenopausal gyn exam IUD removal  Plan:   1) Breast cancer screening - recommend monthly self breast exam. Mammogram as directed by her oncology doctor.  2) Colon cancer screening: discussed options for colon cancer screening. She is interested in a colonoscopy. Will refer to GI. Advised will probably need a medical clearance  3) Cervical cancer screening - Pap was done.   4) Discussed calcium and vitamin D3 requirements  5) Routine healthcare maintenance including cholesterol and diabetes screening managed by PCP   6) RTO in 1 year and prn.  Dalia Heading, CNM

## 2018-01-06 ENCOUNTER — Other Ambulatory Visit (HOSPITAL_COMMUNITY)
Admission: RE | Admit: 2018-01-06 | Discharge: 2018-01-06 | Disposition: A | Discharge status: Home / Self Care | Payer: 59 | Source: Ambulatory Visit | Attending: Certified Nurse Midwife | Admitting: Certified Nurse Midwife

## 2018-01-06 ENCOUNTER — Encounter: Payer: Self-pay | Admitting: Certified Nurse Midwife

## 2018-01-06 ENCOUNTER — Ambulatory Visit (INDEPENDENT_AMBULATORY_CARE_PROVIDER_SITE_OTHER): Payer: 59 | Admitting: Certified Nurse Midwife

## 2018-01-06 ENCOUNTER — Ambulatory Visit
Admission: RE | Admit: 2018-01-06 | Discharge: 2018-01-06 | Disposition: A | Discharge status: Home / Self Care | Payer: 59 | Source: Ambulatory Visit | Attending: Radiation Oncology | Admitting: Radiation Oncology

## 2018-01-06 ENCOUNTER — Inpatient Hospital Stay: Payer: 59

## 2018-01-06 VITALS — BP 150/74 | HR 60 | Ht 62.0 in | Wt 305.8 lb

## 2018-01-06 DIAGNOSIS — Z30432 Encounter for removal of intrauterine contraceptive device: Secondary | ICD-10-CM | POA: Diagnosis not present

## 2018-01-06 DIAGNOSIS — C50411 Malignant neoplasm of upper-outer quadrant of right female breast: Secondary | ICD-10-CM | POA: Diagnosis not present

## 2018-01-06 DIAGNOSIS — Z01419 Encounter for gynecological examination (general) (routine) without abnormal findings: Secondary | ICD-10-CM | POA: Diagnosis present

## 2018-01-06 DIAGNOSIS — C50911 Malignant neoplasm of unspecified site of right female breast: Secondary | ICD-10-CM | POA: Diagnosis not present

## 2018-01-06 DIAGNOSIS — Z170 Estrogen receptor positive status [ER+]: Principal | ICD-10-CM

## 2018-01-06 DIAGNOSIS — Z1211 Encounter for screening for malignant neoplasm of colon: Secondary | ICD-10-CM

## 2018-01-06 DIAGNOSIS — Z124 Encounter for screening for malignant neoplasm of cervix: Secondary | ICD-10-CM

## 2018-01-06 LAB — CBC
HCT: 37.1 % (ref 36.0–46.0)
HEMOGLOBIN: 12 g/dL (ref 12.0–15.0)
MCH: 29.7 pg (ref 26.0–34.0)
MCHC: 32.3 g/dL (ref 30.0–36.0)
MCV: 91.8 fL (ref 80.0–100.0)
NRBC: 0 % (ref 0.0–0.2)
Platelets: 244 10*3/uL (ref 150–400)
RBC: 4.04 MIL/uL (ref 3.87–5.11)
RDW: 13.4 % (ref 11.5–15.5)
WBC: 7.3 10*3/uL (ref 4.0–10.5)

## 2018-01-07 ENCOUNTER — Ambulatory Visit
Admission: RE | Admit: 2018-01-07 | Discharge: 2018-01-07 | Disposition: A | Discharge status: Home / Self Care | Payer: 59 | Source: Ambulatory Visit | Attending: Radiation Oncology | Admitting: Radiation Oncology

## 2018-01-07 ENCOUNTER — Encounter (INDEPENDENT_AMBULATORY_CARE_PROVIDER_SITE_OTHER): Payer: Self-pay

## 2018-01-07 ENCOUNTER — Ambulatory Visit (INDEPENDENT_AMBULATORY_CARE_PROVIDER_SITE_OTHER): Payer: Self-pay | Admitting: Bariatrics

## 2018-01-07 DIAGNOSIS — C50911 Malignant neoplasm of unspecified site of right female breast: Secondary | ICD-10-CM | POA: Diagnosis not present

## 2018-01-08 ENCOUNTER — Ambulatory Visit
Admission: RE | Admit: 2018-01-08 | Discharge: 2018-01-08 | Disposition: A | Discharge status: Home / Self Care | Payer: 59 | Source: Ambulatory Visit | Attending: Radiation Oncology | Admitting: Radiation Oncology

## 2018-01-08 DIAGNOSIS — C50911 Malignant neoplasm of unspecified site of right female breast: Secondary | ICD-10-CM | POA: Diagnosis not present

## 2018-01-09 ENCOUNTER — Ambulatory Visit: Payer: 59

## 2018-01-09 LAB — CYTOLOGY - PAP
DIAGNOSIS: NEGATIVE
HPV (WINDOPATH): NOT DETECTED

## 2018-01-12 ENCOUNTER — Ambulatory Visit
Admission: RE | Admit: 2018-01-12 | Discharge: 2018-01-12 | Disposition: A | Discharge status: Home / Self Care | Payer: 59 | Source: Ambulatory Visit | Attending: Radiation Oncology | Admitting: Radiation Oncology

## 2018-01-12 ENCOUNTER — Ambulatory Visit: Payer: 59

## 2018-01-12 DIAGNOSIS — C50911 Malignant neoplasm of unspecified site of right female breast: Secondary | ICD-10-CM | POA: Diagnosis not present

## 2018-01-13 ENCOUNTER — Ambulatory Visit
Admission: RE | Admit: 2018-01-13 | Discharge: 2018-01-13 | Disposition: A | Discharge status: Home / Self Care | Payer: 59 | Source: Ambulatory Visit | Attending: Radiation Oncology | Admitting: Radiation Oncology

## 2018-01-13 ENCOUNTER — Ambulatory Visit: Payer: 59

## 2018-01-13 DIAGNOSIS — C50911 Malignant neoplasm of unspecified site of right female breast: Secondary | ICD-10-CM | POA: Diagnosis not present

## 2018-01-14 ENCOUNTER — Ambulatory Visit
Admission: RE | Admit: 2018-01-14 | Discharge: 2018-01-14 | Disposition: A | Discharge status: Home / Self Care | Payer: 59 | Source: Ambulatory Visit | Attending: Radiation Oncology | Admitting: Radiation Oncology

## 2018-01-14 ENCOUNTER — Ambulatory Visit: Payer: 59

## 2018-01-14 DIAGNOSIS — C50911 Malignant neoplasm of unspecified site of right female breast: Secondary | ICD-10-CM | POA: Diagnosis not present

## 2018-01-15 ENCOUNTER — Ambulatory Visit: Payer: 59

## 2018-01-15 ENCOUNTER — Ambulatory Visit
Admission: RE | Admit: 2018-01-15 | Discharge: 2018-01-15 | Disposition: A | Discharge status: Home / Self Care | Payer: 59 | Source: Ambulatory Visit | Attending: Radiation Oncology | Admitting: Radiation Oncology

## 2018-01-15 DIAGNOSIS — C50911 Malignant neoplasm of unspecified site of right female breast: Secondary | ICD-10-CM | POA: Diagnosis not present

## 2018-01-16 ENCOUNTER — Ambulatory Visit
Admission: RE | Admit: 2018-01-16 | Discharge: 2018-01-16 | Disposition: A | Discharge status: Home / Self Care | Payer: 59 | Source: Ambulatory Visit | Attending: Radiation Oncology | Admitting: Radiation Oncology

## 2018-01-16 DIAGNOSIS — C50911 Malignant neoplasm of unspecified site of right female breast: Secondary | ICD-10-CM | POA: Diagnosis not present

## 2018-01-16 DIAGNOSIS — Z170 Estrogen receptor positive status [ER+]: Secondary | ICD-10-CM | POA: Insufficient documentation

## 2018-01-18 ENCOUNTER — Encounter: Payer: Self-pay | Admitting: Certified Nurse Midwife

## 2018-01-18 DIAGNOSIS — I4891 Unspecified atrial fibrillation: Secondary | ICD-10-CM | POA: Insufficient documentation

## 2018-01-27 ENCOUNTER — Inpatient Hospital Stay: Payer: 59 | Attending: Oncology | Admitting: Oncology

## 2018-01-27 ENCOUNTER — Other Ambulatory Visit: Payer: Self-pay

## 2018-01-27 ENCOUNTER — Inpatient Hospital Stay: Payer: 59

## 2018-01-27 ENCOUNTER — Encounter: Payer: Self-pay | Admitting: Oncology

## 2018-01-27 VITALS — BP 149/83 | HR 54 | Temp 97.1°F | Resp 81 | Wt 307.4 lb

## 2018-01-27 DIAGNOSIS — Z170 Estrogen receptor positive status [ER+]: Secondary | ICD-10-CM

## 2018-01-27 DIAGNOSIS — Z8249 Family history of ischemic heart disease and other diseases of the circulatory system: Secondary | ICD-10-CM | POA: Diagnosis not present

## 2018-01-27 DIAGNOSIS — I4891 Unspecified atrial fibrillation: Secondary | ICD-10-CM | POA: Diagnosis not present

## 2018-01-27 DIAGNOSIS — C50411 Malignant neoplasm of upper-outer quadrant of right female breast: Secondary | ICD-10-CM | POA: Insufficient documentation

## 2018-01-27 DIAGNOSIS — Z7901 Long term (current) use of anticoagulants: Secondary | ICD-10-CM | POA: Insufficient documentation

## 2018-01-27 DIAGNOSIS — I10 Essential (primary) hypertension: Secondary | ICD-10-CM

## 2018-01-27 DIAGNOSIS — Z79811 Long term (current) use of aromatase inhibitors: Secondary | ICD-10-CM

## 2018-01-27 DIAGNOSIS — Z79899 Other long term (current) drug therapy: Secondary | ICD-10-CM | POA: Insufficient documentation

## 2018-01-27 LAB — COMPREHENSIVE METABOLIC PANEL
ALT: 19 U/L (ref 0–44)
AST: 18 U/L (ref 15–41)
Albumin: 3.8 g/dL (ref 3.5–5.0)
Alkaline Phosphatase: 68 U/L (ref 38–126)
Anion gap: 6 (ref 5–15)
BILIRUBIN TOTAL: 0.7 mg/dL (ref 0.3–1.2)
BUN: 23 mg/dL — AB (ref 6–20)
CHLORIDE: 106 mmol/L (ref 98–111)
CO2: 26 mmol/L (ref 22–32)
CREATININE: 1.09 mg/dL — AB (ref 0.44–1.00)
Calcium: 9.1 mg/dL (ref 8.9–10.3)
GFR calc Af Amer: >60 mL/min (ref >60)
GFR, EST NON AFRICAN AMERICAN: 56 mL/min — AB (ref >60)
Glucose, Bld: 102 mg/dL — ABNORMAL HIGH (ref 70–99)
POTASSIUM: 4.1 mmol/L (ref 3.5–5.1)
Sodium: 138 mmol/L (ref 135–145)
TOTAL PROTEIN: 7.4 g/dL (ref 6.5–8.1)

## 2018-01-27 LAB — CBC WITH DIFFERENTIAL/PLATELET
Abs Immature Granulocytes: 0.02 10*3/uL (ref 0.00–0.07)
BASOS ABS: 0 10*3/uL (ref 0.0–0.1)
BASOS PCT: 1 %
EOS ABS: 0.4 10*3/uL (ref 0.0–0.5)
EOS PCT: 5 %
HCT: 37.6 % (ref 36.0–46.0)
Hemoglobin: 12.3 g/dL (ref 12.0–15.0)
Immature Granulocytes: 0 %
LYMPHS PCT: 14 %
Lymphs Abs: 1 10*3/uL (ref 0.7–4.0)
MCH: 29.6 pg (ref 26.0–34.0)
MCHC: 32.7 g/dL (ref 30.0–36.0)
MCV: 90.4 fL (ref 80.0–100.0)
MONO ABS: 0.7 10*3/uL (ref 0.1–1.0)
Monocytes Relative: 9 %
NRBC: 0 % (ref 0.0–0.2)
Neutro Abs: 5.4 10*3/uL (ref 1.7–7.7)
Neutrophils Relative %: 71 %
PLATELETS: 227 10*3/uL (ref 150–400)
RBC: 4.16 MIL/uL (ref 3.87–5.11)
RDW: 13.1 % (ref 11.5–15.5)
WBC: 7.6 10*3/uL (ref 4.0–10.5)

## 2018-01-27 NOTE — Progress Notes (Signed)
Patient here for follow up. No changes since last visit.   

## 2018-01-27 NOTE — Progress Notes (Signed)
Hematology/Oncology follow up note [facility] [phone] Fax:([phone]) [phone]   Patient Care Team: Perrin Maltese, MD as PCP - General (Internal Medicine) Dionisio David, MD as Consulting Physician (Cardiology) Earlie Server, MD as Consulting Physician (Oncology) Elsie Saas, MD as Consulting Physician (Orthopedic Surgery)  REFERRING PROVIDER: Perrin Maltese, MD  REASON FOR VISIT Follow up for treatment of of breast cancer  HISTORY OF PRESENTING ILLNESS:  Heather Conrad is a  57 y.o.  female with PMH listed below who was referred to me for evaluation of breast cancer.  Recent screening Mammogram showed 1. Complex mass in the right breast, predominantly cystic, with abnormal tissue along its anterior wall. Biopsy is recommended. 2. Benign left breast calcifications.Sonographic evaluation of the right axilla shows no enlarged or abnormal lymph nodes. 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   # 09/01/2017 [name].  she is s/p right lumpectomy and right sentinel lymph node biopsy. 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  #Oncotype Dx recurrence score 13, absoluate chemotherapy risk<1%. Discussed with patient that adjuvant chemotherapy will not be offered.  #  finished adjuvant radiation 11/ 03/2017.  INTERVAL HISTORY MAKESHIA Conrad is a 57 y.o. female who has above history reviewed by me today presents for follow-up visit for management of breast cancer. Patient Tolerated radiation.  No concerns. He has questions about if she can flu vaccine   Review of Systems  Constitutional: Negative for chills, fever, malaise/fatigue and weight loss.  HENT: Negative for congestion, ear discharge, ear pain, nosebleeds, sinus pain and sore throat.   Eyes: Negative for double vision, photophobia,  pain, discharge and redness.  Respiratory: Negative for cough, hemoptysis, sputum production, shortness of breath and wheezing.   Cardiovascular: Negative for chest pain, palpitations, orthopnea, claudication and leg swelling.  Gastrointestinal: Negative for abdominal pain, blood in stool, constipation, diarrhea, heartburn, melena, nausea and vomiting.  Genitourinary: Negative for dysuria, flank pain, frequency and hematuria.  Musculoskeletal: Negative for back pain, myalgias and neck pain.  Skin: Negative for itching and rash.  Neurological: Negative for dizziness, tingling, tremors, focal weakness, weakness and headaches.  Endo/Heme/Allergies: Negative for environmental allergies. Does not bruise/bleed easily.  Psychiatric/Behavioral: Negative for depression and hallucinations. The patient is not nervous/anxious.     MEDICAL HISTORY:  Past Medical History:  Diagnosis Date  . Acute renal failure (ARF) ([place]) 01/2012  . Anxiety   . Arthritis 2003   SPINE AND RIGHT KNEE  . Breast cancer in female [facility]) 07/16/2017   Right-invasive mammary cancer  . Cancer ([place])   . Cough   . Dry skin   . Dyspnea   . Dysrhythmia 01/2017   atrial fibrillation  . Fatigue   . Heart murmur    per pt report  . History of kidney stones   . History of MRSA infection 2013   leg, abdomen, arm  . HPV in female   . Hyperlipidemia 2018  . Hypertension   . Kidney stone 01/2017  . Leg cramping   . Leg pain   . Lumbar stenosis   . Nervousness   . Obesity 01/2017  . Palpitations   . Pyelonephritis   . Shortness of breath on exertion   . Stress   . Swelling of both lower extremities   . UTI (urinary tract infection)   . Vitamin D deficiency   .  Weakness     SURGICAL HISTORY: Past Surgical History:  Procedure Laterality Date  . BREAST BIOPSY Right 07/16/2017   pending path  . BREAST LUMPECTOMY WITH RADIOACTIVE SEED AND SENTINEL LYMPH NODE BIOPSY Right 09/01/2017   Procedure: BREAST LUMPECTOMY WITH  RADIOACTIVE SEED AND SENTINEL LYMPH NODE BIOPSY;  Surgeon: Alphonsa Overall, MD;  Location: [place];  Service: General;  Laterality: Right;  . CHOLECYSTECTOMY     1992  . DILATION AND CURETTAGE OF UTERUS    . EXTRACORPOREAL SHOCK WAVE LITHOTRIPSY Left 02/13/2017   Procedure: EXTRACORPOREAL SHOCK WAVE LITHOTRIPSY (ESWL);  Surgeon: Royston Cowper, MD;  Location: [facility];  Service: Urology;  Laterality: Left;  . FOOT FRACTURE SURGERY Right 2018   repair of trigonem of ankle. endoscopic fasciotomy  . INDUCED ABORTION    . KNEE SURGERY Right    knee scope for torn meniscus  . LUMBAR LAMINECTOMY/DECOMPRESSION MICRODISCECTOMY Bilateral 10/15/2017   Procedure: Laminectomy and Foraminotomy - Lumbar one-two, Lumbar two-three, Lumbar three-four  - bilateral;  Surgeon: Eustace Moore, MD;  Location: [place];  Service: Neurosurgery;  Laterality: Bilateral;    SOCIAL HISTORY: Social History   Socioeconomic History  . Marital status: Single    Spouse name: Not on file  . Number of children: 2  . Years of education: 84  . Highest education level: Not on file  Occupational History  . Occupation: Loss adjuster, chartered  Social Needs  . Financial resource strain: Not on file  . Food insecurity:    Worry: Not on file    Inability: Not on file  . Transportation needs:    Medical: Not on file    Non-medical: Not on file  Tobacco Use  . Smoking status: Never Smoker  . Smokeless tobacco: Never Used  Substance and Sexual Activity  . Alcohol use: Yes    Comment: rare  . Drug use: No  . Sexual activity: Not Currently  Lifestyle  . Physical activity:    Days per week: Not on file    Minutes per session: Not on file  . Stress: Not on file  Relationships  . Social connections:    Talks on phone: Not on file    Gets together: Not on file    Attends religious service: Not on file    Active member of club or organization: Not on file    Attends meetings of clubs or organizations: Not on file     Relationship status: Not on file  . Intimate partner violence:    Fear of current or ex partner: Not on file    Emotionally abused: Not on file    Physically abused: Not on file    Forced sexual activity: Not on file  Other Topics Concern  . Not on file  Social History Narrative  . Not on file    FAMILY HISTORY: Family History  Problem Relation Age of Onset  . Heart disease Father        deceased 56; had 83 siblings  . Hypertension Father   . Kidney disease Father   . Colon cancer Paternal Uncle 88       deceased 36s  . Hypertension Paternal Uncle   . Diabetes Maternal Aunt   . Hypertension Maternal Aunt   . Heart disease Mother        deceased at 1; had 7 siblings  . Hypertension Mother   . Hyperlipidemia Mother   . Depression Mother   . Anxiety disorder Mother   .  Sleep apnea Mother   . Obesity Mother   . Uterine cancer Sister 89       currently 22; no children  . Breast cancer Cousin 42       currently 13; daughter of unaffected maternal aunt    ALLERGIES:  has No Known Allergies.  MEDICATIONS:  Current Outpatient Medications  Medication Sig Dispense Refill  . amiodarone (PACERONE) 200 MG tablet Take 200 mg by mouth 2 (two) times a week. Sunday and Wednesday    . Cholecalciferol (VITAMIN D3) 50000 units CAPS Take 50,000 Units by mouth every Sunday.   2  . citalopram (CELEXA) 40 MG tablet Take 40 mg by mouth daily.  6  . fluticasone (FLONASE) 50 MCG/ACT nasal spray Place 1 spray into both nostrils as needed.  6  . furosemide (LASIX) 20 MG tablet Take 20 mg by mouth daily.  2  . lisinopril-hydrochlorothiazide (PRINZIDE,ZESTORETIC) 10-12.5 MG tablet Take 1 tablet by mouth daily.  2  . meloxicam (MOBIC) 15 MG tablet Take 15 mg by mouth daily.    . silver sulfADIAZINE (SILVADENE) 1 % cream Apply 1 application topically daily. 50 g 0  . XARELTO 20 MG TABS tablet TK 1 T PO QD  2   No current facility-administered medications for this visit.      PHYSICAL  EXAMINATION: ECOG PERFORMANCE STATUS: 1 - Symptomatic but completely ambulatory Vitals:   01/27/18 0959  BP: (!) 149/83  Pulse: (!) 54  Resp: (!) 81  Temp: (!) 97.1 F (36.2 C)   Filed Weights   01/27/18 0959  Weight: (!) 307 lb 6.4 oz (139.4 kg)    Physical Exam  Constitutional: She is oriented to person, place, and time. No distress.  Obese,   HENT:  Head: Normocephalic and atraumatic.  Mouth/Throat: Oropharynx is clear and moist.  Eyes: Pupils are equal, round, and reactive to light. Conjunctivae and EOM are normal. No scleral icterus.  Neck: Normal range of motion. Neck supple.  Cardiovascular: Normal rate, regular rhythm and normal heart sounds.  Pulmonary/Chest: Effort normal and breath sounds normal. No respiratory distress. She has no wheezes. She has no rales. She exhibits no tenderness.  Abdominal: Soft. Bowel sounds are normal. She exhibits no distension and no mass. There is no tenderness.  Musculoskeletal: Normal range of motion. She exhibits no edema or deformity.  Lymphadenopathy:    She has no cervical adenopathy.  Neurological: She is alert and oriented to person, place, and time. No cranial nerve deficit. Coordination normal.  Skin: Skin is warm and dry. No rash noted. No erythema.  Psychiatric: She has a normal mood and affect. Her behavior is normal. Thought content normal.  .    LABORATORY DATA:  I have reviewed the data as listed Lab Results  Component Value Date   WBC 7.6 01/27/2018   HGB 12.3 01/27/2018   HCT 37.6 01/27/2018   MCV 90.4 01/27/2018   PLT 227 01/27/2018   Recent Labs    06/02/17 1736  09/22/17 0940 10/13/17 0919 11/25/17 1106 01/27/18 1045  NA 131*   < > 139 142 139 138  K 3.9   < > 4.6 3.8 4.5 4.1  CL 97*   < > 105 103 100 106  CO2 24   < > '26 29 22 26  ' GLUCOSE 93   < > 106* 99 83 102*  BUN 18   < > 28* 17 22 23*  CREATININE 1.18*   < > 1.28* 1.24* 1.39* 1.09*  CALCIUM 8.0*   < > 9.5 9.3 9.6 9.1  GFRNONAA 51*   < > 46*  48* 42* 56*  GFRAA 59*   < > 54* 56* 49* >60  PROT 6.9   < > 6.9  --  7.0 7.4  ALBUMIN 2.7*   < > 3.5  --  4.3 3.8  AST 16   < > 17  --  13 18  ALT 16   < > 17  --  12 19  ALKPHOS 74   < > 74  --  94 68  BILITOT 0.9   < > 0.6  --  0.7 0.7  BILIDIR 0.3  --   --   --   --   --   IBILI 0.6  --   --   --   --   --    < > = values in this interval not displayed.       ASSESSMENT & PLAN:  1. Malignant neoplasm of upper-outer quadrant of right breast in female, estrogen receptor positive ([place])   2. Aromatase inhibitor use   3. Chronic anticoagulation   Cancer Staging Malignant neoplasm of upper-outer quadrant of right breast in female, estrogen receptor positive ([place]) Staging form: Breast, AJCC 8th Edition - Clinical stage from 07/27/2017: Stage IA (cT1, cN0, cM0, G2, ER+, PR+, HER2: Equivocal) - Signed by Earlie Server, MD on 07/27/2017 - Pathologic: pT1c, pN0, cM0, ER+, PR+, HER2-, Oncotype DX score: 13 - Signed by Earlie Server, MD on 09/22/2017  Rationale of using aromatase inhibitor discussed with patient.  Side effects of Letrozole including but not limited to hot flush, joint pain, fatigue, mood swing, osteoporosis discussed with patient. Patient voices understanding and willing to proceed.  Check FSH/LH, estradiol level today to confirm her menopause state.  Her menses have been absent due to a Mirena IUD that was inserted in 2014 She had IUD removed recently.per GYN note she is postmenopausal. Letrozole Rx sent to pharmacy.  Recommend calcium and vitamin D supplements.  Recent lipid panel [11/25/2017]  reviewed. LDL 121. Otherwise normal.  A fib on chronic anticoagulation with Xarelto. Hemoglobin stable.  Plan repeat unilateral diagnostic mammogram 6 months from now.  We spent sufficient time to discuss many aspect of care, questions were answered to patient's satisfaction.   Return of visit: 6 weeks. Total face to face encounter time for this patient visit was 25 min. >50% of the time was   spent in counseling and coordination of care.   Earlie Server, MD, PhD Hematology Oncology [facility] Pager- [phone] 01/27/2018

## 2018-01-28 LAB — FSH/LH
FSH: 80.9 m[IU]/mL
LH: 33.4 m[IU]/mL

## 2018-01-28 LAB — ESTRADIOL: Estradiol: 8.1 pg/mL

## 2018-01-29 ENCOUNTER — Encounter: Payer: Self-pay | Admitting: *Deleted

## 2018-01-29 MED ORDER — LETROZOLE 2.5 MG PO TABS: 2.5000 mg | ORAL_TABLET | Freq: Every day | ORAL | 1 refills | Status: DC

## 2018-01-29 NOTE — Addendum Note (Signed)
Addended by: Earlie Server on: 01/29/2018 10:44 AM   Modules accepted: Orders

## 2018-02-18 ENCOUNTER — Other Ambulatory Visit: Payer: Self-pay

## 2018-02-18 ENCOUNTER — Ambulatory Visit
Admission: RE | Admit: 2018-02-18 | Discharge: 2018-02-18 | Disposition: A | Discharge status: Home / Self Care | Payer: 59 | Source: Ambulatory Visit | Attending: Radiation Oncology | Admitting: Radiation Oncology

## 2018-02-18 ENCOUNTER — Encounter: Payer: Self-pay | Admitting: Radiation Oncology

## 2018-02-18 VITALS — Temp 97.6°F | Wt 319.8 lb

## 2018-02-18 DIAGNOSIS — Z170 Estrogen receptor positive status [ER+]: Secondary | ICD-10-CM | POA: Insufficient documentation

## 2018-02-18 DIAGNOSIS — C50411 Malignant neoplasm of upper-outer quadrant of right female breast: Secondary | ICD-10-CM

## 2018-02-18 DIAGNOSIS — Z923 Personal history of irradiation: Secondary | ICD-10-CM | POA: Insufficient documentation

## 2018-02-18 NOTE — Progress Notes (Signed)
Radiation Oncology Follow up Note  Name: Heather Conrad   Date:   02/18/2018 MRN:  [id number] DOB: [birth year]    This 57 y.o. female presents to the clinic today for ne-month follow-up status post whole breast radiation to her right breast for stage I ER/PR positive invasive mammary carcinoma.  REFERRING PROVIDER: Perrin Maltese, MD  HPI: patient is a 57 year old female now seen out 1 month having completed whole breast radiation to her right breast for ER/PR positive in stage I invasive mammary carcinoma seen today in routine follow-up she is doing well. She specifically denies breast tenderness cough or bone pain. She has started on.Femara this time that well without side effect.  COMPLICATIONS OF TREATMENT: none  FOLLOW UP COMPLIANCE: keeps appointments   PHYSICAL EXAM:  BP (!) (P) 156/82 (BP Location: Left Arm, Patient Position: Sitting)   Pulse (!) (P) 58   Temp 97.6 F (36.4 C) (Tympanic)   Resp (P) 16   Wt (!) 319 lb 12.4 oz (145.1 kg)   BMI 58.49 kg/m  Lungs are clear to A&P cardiac examination essentially unremarkable with regular rate and rhythm. No dominant mass or nodularity is noted in either breast in 2 positions examined. Incision is well-healed. No axillary or supraclavicular adenopathy is appreciated. Cosmetic result is excellent.Well-developed well-nourished patient in NAD. HEENT reveals PERLA, EOMI, discs not visualized.  Oral cavity is clear. No oral mucosal lesions are identified. Neck is clear without evidence of cervical or supraclavicular adenopathy. Lungs are clear to A&P. Cardiac examination is essentially unremarkable with regular rate and rhythm without murmur rub or thrill. Abdomen is benign with no organomegaly or masses noted. Motor sensory and DTR levels are equal and symmetric in the upper and lower extremities. Cranial nerves II through XII are grossly intact. Proprioception is intact. No peripheral adenopathy or edema is identified. No motor or sensory  levels are noted. Crude visual fields are within normal range.  RADIOLOGY RESULTS: no currentfilms for review  PLAN: at the present time patient is recovering nicely from her whole breast radiation. I'm please were overall progress. I've asked to see her back in 4-5 months for follow-up. She continues on Femara without side effect. Patient is to call with any concerns at any time.  I would like to take this opportunity to thank you for allowing me to participate in the care of your patient.Noreene Filbert, MD

## 2018-03-19 ENCOUNTER — Other Ambulatory Visit: Payer: Self-pay

## 2018-03-19 DIAGNOSIS — C50411 Malignant neoplasm of upper-outer quadrant of right female breast: Secondary | ICD-10-CM

## 2018-03-19 DIAGNOSIS — Z170 Estrogen receptor positive status [ER+]: Principal | ICD-10-CM

## 2018-03-20 ENCOUNTER — Other Ambulatory Visit: Payer: Self-pay

## 2018-03-20 ENCOUNTER — Inpatient Hospital Stay: Payer: 59 | Attending: Oncology

## 2018-03-20 ENCOUNTER — Encounter: Payer: Self-pay | Admitting: Oncology

## 2018-03-20 ENCOUNTER — Inpatient Hospital Stay (HOSPITAL_BASED_OUTPATIENT_CLINIC_OR_DEPARTMENT_OTHER): Payer: 59 | Admitting: Oncology

## 2018-03-20 VITALS — BP 119/76 | HR 59 | Temp 96.8°F | Resp 18 | Wt 314.2 lb

## 2018-03-20 DIAGNOSIS — Z791 Long term (current) use of non-steroidal anti-inflammatories (NSAID): Secondary | ICD-10-CM | POA: Diagnosis not present

## 2018-03-20 DIAGNOSIS — C50411 Malignant neoplasm of upper-outer quadrant of right female breast: Secondary | ICD-10-CM

## 2018-03-20 DIAGNOSIS — Z7901 Long term (current) use of anticoagulants: Secondary | ICD-10-CM | POA: Diagnosis not present

## 2018-03-20 DIAGNOSIS — I4891 Unspecified atrial fibrillation: Secondary | ICD-10-CM

## 2018-03-20 DIAGNOSIS — M2550 Pain in unspecified joint: Secondary | ICD-10-CM | POA: Insufficient documentation

## 2018-03-20 DIAGNOSIS — R5383 Other fatigue: Secondary | ICD-10-CM

## 2018-03-20 DIAGNOSIS — I10 Essential (primary) hypertension: Secondary | ICD-10-CM | POA: Diagnosis not present

## 2018-03-20 DIAGNOSIS — Z79811 Long term (current) use of aromatase inhibitors: Secondary | ICD-10-CM | POA: Diagnosis not present

## 2018-03-20 DIAGNOSIS — Z79899 Other long term (current) drug therapy: Secondary | ICD-10-CM | POA: Insufficient documentation

## 2018-03-20 DIAGNOSIS — R232 Flushing: Secondary | ICD-10-CM

## 2018-03-20 DIAGNOSIS — Z170 Estrogen receptor positive status [ER+]: Secondary | ICD-10-CM | POA: Diagnosis not present

## 2018-03-20 DIAGNOSIS — Z8249 Family history of ischemic heart disease and other diseases of the circulatory system: Secondary | ICD-10-CM | POA: Diagnosis not present

## 2018-03-20 LAB — COMPREHENSIVE METABOLIC PANEL
ALBUMIN: 3.8 g/dL (ref 3.5–5.0)
ALT: 18 U/L (ref 0–44)
ANION GAP: 6 (ref 5–15)
AST: 18 U/L (ref 15–41)
Alkaline Phosphatase: 67 U/L (ref 38–126)
BUN: 27 mg/dL — ABNORMAL HIGH (ref 6–20)
CHLORIDE: 104 mmol/L (ref 98–111)
CO2: 29 mmol/L (ref 22–32)
Calcium: 9.3 mg/dL (ref 8.9–10.3)
Creatinine, Ser: 1.11 mg/dL — ABNORMAL HIGH (ref 0.44–1.00)
GFR calc Af Amer: >60 mL/min (ref >60)
GFR, EST NON AFRICAN AMERICAN: 55 mL/min — AB (ref >60)
GLUCOSE: 122 mg/dL — AB (ref 70–99)
POTASSIUM: 4.4 mmol/L (ref 3.5–5.1)
Sodium: 139 mmol/L (ref 135–145)
Total Bilirubin: 0.8 mg/dL (ref 0.3–1.2)
Total Protein: 7.2 g/dL (ref 6.5–8.1)

## 2018-03-20 LAB — CBC WITH DIFFERENTIAL/PLATELET
ABS IMMATURE GRANULOCYTES: 0.02 10*3/uL (ref 0.00–0.07)
BASOS ABS: 0 10*3/uL (ref 0.0–0.1)
Basophils Relative: 0 %
Eosinophils Absolute: 0.3 10*3/uL (ref 0.0–0.5)
Eosinophils Relative: 4 %
HCT: 38.4 % (ref 36.0–46.0)
HEMOGLOBIN: 12.4 g/dL (ref 12.0–15.0)
IMMATURE GRANULOCYTES: 0 %
LYMPHS PCT: 25 %
Lymphs Abs: 1.9 10*3/uL (ref 0.7–4.0)
MCH: 28.5 pg (ref 26.0–34.0)
MCHC: 32.3 g/dL (ref 30.0–36.0)
MCV: 88.3 fL (ref 80.0–100.0)
MONO ABS: 0.7 10*3/uL (ref 0.1–1.0)
Monocytes Relative: 10 %
NEUTROS ABS: 4.4 10*3/uL (ref 1.7–7.7)
NEUTROS PCT: 61 %
PLATELETS: 275 10*3/uL (ref 150–400)
RBC: 4.35 MIL/uL (ref 3.87–5.11)
RDW: 13.4 % (ref 11.5–15.5)
WBC: 7.3 10*3/uL (ref 4.0–10.5)
nRBC: 0 % (ref 0.0–0.2)

## 2018-03-20 NOTE — Progress Notes (Signed)
Patient here for follow up. No concerns voiced.  °

## 2018-03-21 NOTE — Progress Notes (Signed)
Hematology/Oncology follow up note [facility] Telephone:([phone]) [phone] [phone]   Patient Care Team: Perrin Maltese, MD as PCP - General (Internal Medicine) Dionisio David, MD as Consulting Physician (Cardiology) Earlie Server, MD as Consulting Physician (Oncology) Elsie Saas, MD as Consulting Physician (Orthopedic Surgery)  REFERRING PROVIDER: Perrin Maltese, MD  REASON FOR VISIT Follow up for treatment of of breast cancer  HISTORY OF PRESENTING ILLNESS:  Heather Conrad is a  58 y.o.  female with PMH listed below who was referred to me for evaluation of breast cancer.  Recent screening Mammogram showed 1. Complex mass in the right breast, predominantly cystic, with abnormal tissue along its anterior wall. Biopsy is recommended. 2. Benign left breast calcifications.Sonographic evaluation of the right axilla shows no enlarged or abnormal lymph nodes. 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   # 09/01/2017 [name].  she is s/p right lumpectomy and right sentinel lymph node biopsy. 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  #Oncotype Dx recurrence score 13, absoluate chemotherapy risk<1%. Discussed with patient that adjuvant chemotherapy will not be offered.  #  finished adjuvant radiation 11/ 03/2017.  INTERVAL HISTORY Heather Conrad is a 58 y.o. female who has above history reviewed by me today presents for follow-up visit for management of breast cancer. pT1cN0 Reports tolerating aromatase inhibitor letrozole well.  Manageable hot flash and chronic arthralgia.  No new complaints.  Review of Systems  Constitutional: Negative for appetite change, chills, fatigue and fever.  HENT:   Negative for hearing loss and voice change.   Eyes: Negative for eye problems.  Respiratory: Negative for  chest tightness and cough.   Cardiovascular: Negative for chest pain.  Gastrointestinal: Negative for abdominal distention, abdominal pain and blood in stool.  Endocrine: Positive for hot flashes.  Genitourinary: Negative for difficulty urinating and frequency.   Musculoskeletal: Positive for arthralgias.  Skin: Negative for itching and rash.  Neurological: Negative for extremity weakness.  Hematological: Negative for adenopathy.  Psychiatric/Behavioral: Negative for confusion.     MEDICAL HISTORY:  Past Medical History:  Diagnosis Date  . Acute renal failure (ARF) ([place]) 01/2012  . Anxiety   . Arthritis 2003   SPINE AND RIGHT KNEE  . Breast cancer in female [facility]) 07/16/2017   Right-invasive mammary cancer  . Cancer ([place])   . Cough   . Dry skin   . Dyspnea   . Dysrhythmia 01/2017   atrial fibrillation  . Fatigue   . Heart murmur    per pt report  . History of kidney stones   . History of MRSA infection 2013   leg, abdomen, arm  . HPV in female   . Hyperlipidemia 2018  . Hypertension   . Kidney stone 01/2017  . Leg cramping   . Leg pain   . Lumbar stenosis   . Nervousness   . Obesity 01/2017  . Palpitations   . Pyelonephritis   . Shortness of breath on exertion   . Stress   . Swelling of both lower extremities   . UTI (urinary tract infection)   . Vitamin D deficiency   . Weakness     SURGICAL HISTORY: Past Surgical History:  Procedure Laterality Date  . BREAST BIOPSY Right 07/16/2017   pending path  . BREAST LUMPECTOMY WITH RADIOACTIVE SEED AND SENTINEL LYMPH NODE BIOPSY Right 09/01/2017  Procedure: BREAST LUMPECTOMY WITH RADIOACTIVE SEED AND SENTINEL LYMPH NODE BIOPSY;  Surgeon: Alphonsa Overall, MD;  Location: [place];  Service: General;  Laterality: Right;  . CHOLECYSTECTOMY     1992  . DILATION AND CURETTAGE OF UTERUS    . EXTRACORPOREAL SHOCK WAVE LITHOTRIPSY Left 02/13/2017   Procedure: EXTRACORPOREAL SHOCK WAVE LITHOTRIPSY (ESWL);  Surgeon: Royston Cowper, MD;  Location: [facility];  Service: Urology;  Laterality: Left;  . FOOT FRACTURE SURGERY Right 2018   repair of trigonem of ankle. endoscopic fasciotomy  . INDUCED ABORTION    . KNEE SURGERY Right    knee scope for torn meniscus  . LUMBAR LAMINECTOMY/DECOMPRESSION MICRODISCECTOMY Bilateral 10/15/2017   Procedure: Laminectomy and Foraminotomy - Lumbar one-two, Lumbar two-three, Lumbar three-four  - bilateral;  Surgeon: Eustace Moore, MD;  Location: [place];  Service: Neurosurgery;  Laterality: Bilateral;    SOCIAL HISTORY: Social History   Socioeconomic History  . Marital status: Single    Spouse name: Not on file  . Number of children: 2  . Years of education: 35  . Highest education level: Not on file  Occupational History  . Occupation: Loss adjuster, chartered  Social Needs  . Financial resource strain: Not on file  . Food insecurity:    Worry: Not on file    Inability: Not on file  . Transportation needs:    Medical: Not on file    Non-medical: Not on file  Tobacco Use  . Smoking status: Never Smoker  . Smokeless tobacco: Never Used  Substance and Sexual Activity  . Alcohol use: Yes    Comment: rare  . Drug use: No  . Sexual activity: Not Currently  Lifestyle  . Physical activity:    Days per week: Not on file    Minutes per session: Not on file  . Stress: Not on file  Relationships  . Social connections:    Talks on phone: Not on file    Gets together: Not on file    Attends religious service: Not on file    Active member of club or organization: Not on file    Attends meetings of clubs or organizations: Not on file    Relationship status: Not on file  . Intimate partner violence:    Fear of current or ex partner: Not on file    Emotionally abused: Not on file    Physically abused: Not on file    Forced sexual activity: Not on file  Other Topics Concern  . Not on file  Social History Narrative  . Not on file    FAMILY HISTORY: Family History    Problem Relation Age of Onset  . Heart disease Father        deceased 8; had 51 siblings  . Hypertension Father   . Kidney disease Father   . Colon cancer Paternal Uncle 20       deceased 49s  . Hypertension Paternal Uncle   . Diabetes Maternal Aunt   . Hypertension Maternal Aunt   . Heart disease Mother        deceased at 40; had 7 siblings  . Hypertension Mother   . Hyperlipidemia Mother   . Depression Mother   . Anxiety disorder Mother   . Sleep apnea Mother   . Obesity Mother   . Uterine cancer Sister 69       currently 107; no children  . Breast cancer Cousin 46       currently 4;  daughter of unaffected maternal aunt    ALLERGIES:  has No Known Allergies.  MEDICATIONS:  Current Outpatient Medications  Medication Sig Dispense Refill  . amiodarone (PACERONE) 200 MG tablet Take 200 mg by mouth 2 (two) times a week. Sunday and Wednesday    . Cholecalciferol (VITAMIN D3) 50000 units CAPS Take 50,000 Units by mouth every Sunday.   2  . citalopram (CELEXA) 40 MG tablet Take 40 mg by mouth daily.  6  . fluticasone (FLONASE) 50 MCG/ACT nasal spray Place 1 spray into both nostrils as needed.  6  . furosemide (LASIX) 20 MG tablet Take 20 mg by mouth daily.  2  . letrozole (FEMARA) 2.5 MG tablet Take 1 tablet (2.5 mg total) by mouth daily. 90 tablet 1  . lisinopril-hydrochlorothiazide (PRINZIDE,ZESTORETIC) 10-12.5 MG tablet Take 1 tablet by mouth daily.  2  . meloxicam (MOBIC) 15 MG tablet Take 15 mg by mouth daily.    Alveda Reasons 20 MG TABS tablet TK 1 T PO QD  2   No current facility-administered medications for this visit.      PHYSICAL EXAMINATION: ECOG PERFORMANCE STATUS: 1 - Symptomatic but completely ambulatory Vitals:   03/20/18 1456  BP: 119/76  Pulse: (!) 59  Resp: 18  Temp: (!) 96.8 F (36 C)   Filed Weights   03/20/18 1456  Weight: (!) 314 lb 3.2 oz (142.5 kg)    Physical Exam Constitutional:      General: She is not in acute distress.    Comments:  Obese,   HENT:     Head: Normocephalic and atraumatic.  Eyes:     General: No scleral icterus.    Conjunctiva/sclera: Conjunctivae normal.     Pupils: Pupils are equal, round, and reactive to light.  Neck:     Musculoskeletal: Normal range of motion and neck supple.  Cardiovascular:     Rate and Rhythm: Normal rate and regular rhythm.     Heart sounds: Normal heart sounds.  Pulmonary:     Effort: Pulmonary effort is normal. No respiratory distress.     Breath sounds: Normal breath sounds. No wheezing or rales.  Chest:     Chest wall: No tenderness.  Abdominal:     General: Bowel sounds are normal. There is no distension.     Palpations: Abdomen is soft. There is no mass.     Tenderness: There is no abdominal tenderness.  Musculoskeletal: Normal range of motion.        General: No deformity.  Lymphadenopathy:     Cervical: No cervical adenopathy.  Skin:    General: Skin is warm and dry.     Findings: No erythema or rash.  Neurological:     Mental Status: She is alert and oriented to person, place, and time.     Cranial Nerves: No cranial nerve deficit.     Coordination: Coordination normal.  Psychiatric:        Behavior: Behavior normal.        Thought Content: Thought content normal.   .    LABORATORY DATA:  I have reviewed the data as listed Lab Results  Component Value Date   WBC 7.3 03/20/2018   HGB 12.4 03/20/2018   HCT 38.4 03/20/2018   MCV 88.3 03/20/2018   PLT 275 03/20/2018   Recent Labs    06/02/17 1736  11/25/17 1106 01/27/18 1045 03/20/18 1439  NA 131*   < > 139 138 139  K 3.9   < >  4.5 4.1 4.4  CL 97*   < > 100 106 104  CO2 24   < > _0 GLUCOSE 93   < > 83 102* 122*  BUN 18   < > 22 23* 27*  CREATININE 1.18*   < > 1.39* 1.09* 1.11*  CALCIUM 8.0*   < > 9.6 9.1 9.3  GFRNONAA 51*   < > 42* 56* 55*  GFRAA 59*   < > 49* >60 >60  PROT 6.9   < > 7.0 7.4 7.2  ALBUMIN 2.7*   < > 4.3 3.8 3.8  AST 16   < > _1 ALT 16   < > _2 ALKPHOS 74   < > 94 68 67  BILITOT 0.9   < > 0.7 0.7 0.8  BILIDIR 0.3  --   --   --   --   IBILI 0.6  --   --   --   --    < > = values in this interval not displayed.       ASSESSMENT & PLAN:  1. Malignant neoplasm of upper-outer quadrant of right breast in female, estrogen receptor positive ([place])   Cancer Staging Malignant neoplasm of upper-outer quadrant of right breast in female, estrogen receptor positive ([place]) Staging form: Breast, AJCC 8th Edition - Clinical stage from 07/27/2017: Stage IA (cT1, cN0, cM0, G2, ER+, PR+, HER2: Equivocal) - Signed by Earlie Server, MD on 07/27/2017 - Pathologic: pT1c, pN0, cM0, ER+, PR+, HER2-, Oncotype DX score: 13 - Signed by Earlie Server, MD on 09/22/2017  #Stage 1A right breast cancer, ER PR positive, HER-2 negative. Oncotype score 13 Status post lumpectomy and sentinel lymph node biopsy, status post adjuvant radiation. Currently on aromatase inhibitor with letrozole. Tolerates very well. Continue letrozole 2.5 mg daily.  Duration of treatment is 10 years. lipid panel [11/25/2017]  reviewed. LDL 121. Otherwise normal.  Recommend patient to take over-the-counter calcium and vitamin D supplements.  #Chronic anticoagulation on Xarelto.  Hemoglobin stable. #Patient is due for bilateral diagnostic mammogram in April 2020.  And I will see patient after mammogram. We spent sufficient time to discuss many aspect of care, questions were answered to patient's satisfaction.  Return of visit: April 2020.  Earlie Server, MD, PhD Hematology Oncology [facility] Pager- [phone] 03/21/2018

## 2018-03-23 ENCOUNTER — Ambulatory Visit: Payer: 59 | Admitting: Gastroenterology

## 2018-04-23 ENCOUNTER — Ambulatory Visit: Payer: 59 | Admitting: Gastroenterology

## 2018-05-12 ENCOUNTER — Ambulatory Visit: Payer: 59 | Admitting: Gastroenterology

## 2018-05-12 ENCOUNTER — Encounter: Payer: Self-pay | Admitting: Gastroenterology

## 2018-05-12 ENCOUNTER — Other Ambulatory Visit: Payer: Self-pay

## 2018-05-12 VITALS — BP 140/85 | HR 57 | Ht 62.0 in | Wt 317.0 lb

## 2018-05-12 DIAGNOSIS — Z1211 Encounter for screening for malignant neoplasm of colon: Secondary | ICD-10-CM | POA: Diagnosis not present

## 2018-05-13 ENCOUNTER — Telehealth: Payer: Self-pay

## 2018-05-13 NOTE — Progress Notes (Signed)
Heather Conrad [address]  [place]  [place], [place] [address]  Main: [phone]  Fax: [phone]   Gastroenterology Consultation  Referring Provider:     Dalia Heading, CNM Primary Care Physician:  Perrin Maltese, MD Reason for Consultation:    Evaluation for colon cancer screening and patient would invasive breast cancer        HPI:    Chief Complaint  Patient presents with  . New Patient (Initial Visit)    ref. by C. Danise Mina for colon cancer screening    Heather Conrad is a 58 y.o. y/o female referred for consultation & management  by Dr. Perrin Maltese, MD.  Patient with history of invasive breast cancer, currently being followed by hematology and on medications, referred for evaluation of colonoscopy.  The patient denies any previous history of colonoscopy.  No family history of colon cancer. The patient denies abdominal or flank pain, anorexia, nausea or vomiting, dysphagia, change in bowel habits or black or bloody stools or weight loss.  Labs review and do not show any anemia.  Liver enzymes are normal.  Past Medical History:  Diagnosis Date  . Acute renal failure (ARF) ([place]) 01/2012  . Anxiety   . Arthritis 2003   SPINE AND RIGHT KNEE  . Breast cancer in female [facility]) 07/16/2017   Right-invasive mammary cancer  . Cancer ([place])   . Cough   . Dry skin   . Dyspnea   . Dysrhythmia 01/2017   atrial fibrillation  . Fatigue   . Heart murmur    per pt report  . History of kidney stones   . History of MRSA infection 2013   leg, abdomen, arm  . HPV in female   . Hyperlipidemia 2018  . Hypertension   . Kidney stone 01/2017  . Leg cramping   . Leg pain   . Lumbar stenosis   . Nervousness   . Obesity 01/2017  . Palpitations   . Pyelonephritis   . Shortness of breath on exertion   . Stress   . Swelling of both lower extremities   . UTI (urinary tract infection)   . Vitamin D deficiency   . Weakness     Past Surgical History:    Procedure Laterality Date  . BREAST BIOPSY Right 07/16/2017   pending path  . BREAST LUMPECTOMY WITH RADIOACTIVE SEED AND SENTINEL LYMPH NODE BIOPSY Right 09/01/2017   Procedure: BREAST LUMPECTOMY WITH RADIOACTIVE SEED AND SENTINEL LYMPH NODE BIOPSY;  Surgeon: Alphonsa Overall, MD;  Location: [place];  Service: General;  Laterality: Right;  . CHOLECYSTECTOMY     1992  . DILATION AND CURETTAGE OF UTERUS    . EXTRACORPOREAL SHOCK WAVE LITHOTRIPSY Left 02/13/2017   Procedure: EXTRACORPOREAL SHOCK WAVE LITHOTRIPSY (ESWL);  Surgeon: Royston Cowper, MD;  Location: [facility];  Service: Urology;  Laterality: Left;  . FOOT FRACTURE SURGERY Right 2018   repair of trigonem of ankle. endoscopic fasciotomy  . INDUCED ABORTION    . KNEE SURGERY Right    knee scope for torn meniscus  . LUMBAR LAMINECTOMY/DECOMPRESSION MICRODISCECTOMY Bilateral 10/15/2017   Procedure: Laminectomy and Foraminotomy - Lumbar one-two, Lumbar two-three, Lumbar three-four  - bilateral;  Surgeon: Eustace Moore, MD;  Location: [place];  Service: Neurosurgery;  Laterality: Bilateral;    Prior to Admission medications   Medication Sig Start Date End Date Taking? Authorizing Provider  amiodarone (PACERONE) 200 MG tablet Take 200 mg by mouth 2 (two) times  a week. Sunday and Wednesday   Yes Neoma Laming A, MD  Cholecalciferol (VITAMIN D3) 50000 units CAPS Take 50,000 Units by mouth every Sunday.  06/27/17  Yes [provider]  citalopram (CELEXA) 40 MG tablet Take 40 mg by mouth daily. 07/02/17  Yes [provider]  fluticasone (FLONASE) 50 MCG/ACT nasal spray Place 1 spray into both nostrils as needed. 12/12/17  Yes [provider]  furosemide (LASIX) 20 MG tablet Take 20 mg by mouth daily. 08/19/17  Yes [provider]  letrozole (FEMARA) 2.5 MG tablet Take 1 tablet (2.5 mg total) by mouth daily. 01/29/18  Yes Earlie Server, MD  lisinopril-hydrochlorothiazide (PRINZIDE,ZESTORETIC) 10-12.5 MG tablet Take 1  tablet by mouth daily. 08/19/17  Yes [provider]  XARELTO 20 MG TABS tablet TK 1 T PO QD 10/21/17  Yes [provider]  meloxicam (MOBIC) 15 MG tablet Take 15 mg by mouth daily.    [provider]    Family History  Problem Relation Age of Onset  . Heart disease Father        deceased 23; had 35 siblings  . Hypertension Father   . Kidney disease Father   . Colon cancer Paternal Uncle 63       deceased 76s  . Hypertension Paternal Uncle   . Diabetes Maternal Aunt   . Hypertension Maternal Aunt   . Heart disease Mother        deceased at 10; had 7 siblings  . Hypertension Mother   . Hyperlipidemia Mother   . Depression Mother   . Anxiety disorder Mother   . Sleep apnea Mother   . Obesity Mother   . Uterine cancer Sister 52       currently 5; no children  . Breast cancer Cousin 101       currently 98; daughter of unaffected maternal aunt     Social History   Tobacco Use  . Smoking status: Never Smoker  . Smokeless tobacco: Never Used  Substance Use Topics  . Alcohol use: Yes    Comment: rare  . Drug use: No    Allergies as of 05/12/2018  . (No Known Allergies)    Review of Systems:    All systems reviewed and negative except where noted in HPI.   Physical Exam:  BP 140/85   Pulse (!) 57   Ht 5\' 2"  (1.575 m)   Wt (!) 317 lb (143.8 kg)   BMI 57.98 kg/m  No LMP recorded. (Menstrual status: IUD). Psych:  Alert and cooperative. Normal mood and affect. General:   Alert,  Well-developed, well-nourished, pleasant and cooperative in NAD Head:  Normocephalic and atraumatic. Eyes:  Sclera clear, no icterus.   Conjunctiva pink. Ears:  Normal auditory acuity. Nose:  No deformity, discharge, or lesions. Mouth:  No deformity or lesions,oropharynx pink & moist. Neck:  Supple; no masses or thyromegaly. Abdomen:  Normal bowel sounds.  No bruits.  Soft, non-tender and non-distended without masses, hepatosplenomegaly or hernias noted.  No guarding  or rebound tenderness.    Msk:  Symmetrical without gross deformities. Good, equal movement & strength bilaterally. Pulses:  Normal pulses noted. Extremities:  No clubbing or edema.  No cyanosis. Neurologic:  Alert and oriented x3;  grossly normal neurologically. Skin:  Intact without significant lesions or rashes. No jaundice. Lymph Nodes:  No significant cervical adenopathy. Psych:  Alert and cooperative. Normal mood and affect.   Labs: CBC    Component Value Date/Time  WBC 7.3 03/20/2018 1439   RBC 4.35 03/20/2018 1439   HGB 12.4 03/20/2018 1439   HGB 13.6 11/25/2017 1106   HCT 38.4 03/20/2018 1439   HCT 41.3 11/25/2017 1106   PLT 275 03/20/2018 1439   PLT 318 02/24/2015 1352   MCV 88.3 03/20/2018 1439   MCV 92 11/25/2017 1106   MCV 90 05/06/2011 2025   MCH 28.5 03/20/2018 1439   MCHC 32.3 03/20/2018 1439   RDW 13.4 03/20/2018 1439   RDW 13.1 11/25/2017 1106   RDW 13.8 05/06/2011 2025   LYMPHSABS 1.9 03/20/2018 1439   LYMPHSABS 1.8 11/25/2017 1106   MONOABS 0.7 03/20/2018 1439   EOSABS 0.3 03/20/2018 1439   EOSABS 0.2 11/25/2017 1106   BASOSABS 0.0 03/20/2018 1439   BASOSABS 0.0 11/25/2017 1106   CMP     Component Value Date/Time   NA 139 03/20/2018 1439   NA 139 11/25/2017 1106   NA 133 (L) 05/06/2011 2025   K 4.4 03/20/2018 1439   K 4.0 05/06/2011 2025   CL 104 03/20/2018 1439   CL 98 05/06/2011 2025   CO2 29 03/20/2018 1439   CO2 22 05/06/2011 2025   GLUCOSE 122 (H) 03/20/2018 1439   GLUCOSE 95 05/06/2011 2025   BUN 27 (H) 03/20/2018 1439   BUN 22 11/25/2017 1106   BUN 38 (H) 05/06/2011 2025   CREATININE 1.11 (H) 03/20/2018 1439   CREATININE 3.09 (H) 05/06/2011 2025   CALCIUM 9.3 03/20/2018 1439   CALCIUM 7.5 (L) 05/06/2011 2025   PROT 7.2 03/20/2018 1439   PROT 7.0 11/25/2017 1106   PROT 6.3 (L) 05/06/2011 2025   ALBUMIN 3.8 03/20/2018 1439   ALBUMIN 4.3 11/25/2017 1106   ALBUMIN 2.8 (L) 05/06/2011 2025   AST 18 03/20/2018 1439   AST 28  05/06/2011 2025   ALT 18 03/20/2018 1439   ALT 22 05/06/2011 2025   ALKPHOS 67 03/20/2018 1439   ALKPHOS 76 05/06/2011 2025   BILITOT 0.8 03/20/2018 1439   BILITOT 0.7 11/25/2017 1106   BILITOT 3.1 (H) 05/06/2011 2025   GFRNONAA 55 (L) 03/20/2018 1439   GFRNONAA 17 (L) 05/06/2011 2025   GFRAA >60 03/20/2018 1439   GFRAA 21 (L) 05/06/2011 2025    Imaging Studies: No results found.  Assessment and Plan:   KEYMONI MCCASTER is a 58 y.o. y/o female with history of invasive breast cancer, currently undergoing treatment, here for evaluation for colonoscopy  I do not see any contraindications to proceeding with her colonoscopy Her labs were reviewed and are reassuring  She has never had a screening colonoscopy and is past due for one  I have discussed alternative options, risks & benefits,  which include, but are not limited to, bleeding, infection, perforation,respiratory complication & drug reaction.  The patient agrees with this plan & written consent will be obtained.       Dr Heather Conrad  Speech recognition software was used to dictate the above note.

## 2018-05-13 NOTE — Telephone Encounter (Signed)
Left message that Xarelto is to be held 3 days prior to procedure and to restart 1 day after procedure if hemodynamically stable an no significant complications with procedure.

## 2018-05-27 ENCOUNTER — Telehealth: Payer: Self-pay

## 2018-05-27 NOTE — Telephone Encounter (Signed)
Xarelto is to be held 3 days prior to procedure and to restart 1 day after procedure if hemodynamically stable an no significant complications with procedure.  Left message to read her my chart message regarding her procedure and just let me know she read it or contact office. I did reach pt at work number and she was informed of Xarelto directions.

## 2018-06-05 ENCOUNTER — Telehealth: Payer: Self-pay

## 2018-06-05 NOTE — Telephone Encounter (Signed)
Patient has been informed that due to COVID 19, we will need to reschedule her colonoscopy and she will be contacted once restrictions have been lifted.  Thanks [employer]

## 2018-06-12 ENCOUNTER — Encounter: Admission: RE | Payer: Self-pay | Source: Home / Self Care

## 2018-06-12 ENCOUNTER — Ambulatory Visit: Admission: RE | Admit: 2018-06-12 | Payer: 59 | Source: Home / Self Care | Admitting: Gastroenterology

## 2018-06-12 SURGERY — COLONOSCOPY WITH PROPOFOL
Anesthesia: General

## 2018-06-18 ENCOUNTER — Telehealth: Payer: Self-pay

## 2018-06-18 NOTE — Telephone Encounter (Signed)
LVM for pt to call office to reschedule her colonoscopy with Dr. Bonna Gains for the month of June or July.  Thanks [employer]

## 2018-06-24 ENCOUNTER — Ambulatory Visit: Payer: 59 | Admitting: Oncology

## 2018-06-24 ENCOUNTER — Other Ambulatory Visit: Payer: 59

## 2018-07-06 ENCOUNTER — Ambulatory Visit
Admission: RE | Admit: 2018-07-06 | Discharge: 2018-07-06 | Disposition: A | Discharge status: Home / Self Care | Payer: 59 | Source: Ambulatory Visit | Attending: Oncology | Admitting: Oncology

## 2018-07-06 ENCOUNTER — Other Ambulatory Visit: Payer: Self-pay

## 2018-07-06 ENCOUNTER — Inpatient Hospital Stay: Payer: 59 | Attending: Oncology

## 2018-07-06 DIAGNOSIS — Z170 Estrogen receptor positive status [ER+]: Secondary | ICD-10-CM | POA: Insufficient documentation

## 2018-07-06 DIAGNOSIS — C50411 Malignant neoplasm of upper-outer quadrant of right female breast: Secondary | ICD-10-CM

## 2018-07-06 HISTORY — DX: Personal history of irradiation: Z92.3

## 2018-07-06 LAB — COMPREHENSIVE METABOLIC PANEL
ALT: 24 U/L (ref 0–44)
AST: 22 U/L (ref 15–41)
Albumin: 3.9 g/dL (ref 3.5–5.0)
Alkaline Phosphatase: 66 U/L (ref 38–126)
Anion gap: 8 (ref 5–15)
BUN: 25 mg/dL — ABNORMAL HIGH (ref 6–20)
CO2: 26 mmol/L (ref 22–32)
Calcium: 9.3 mg/dL (ref 8.9–10.3)
Chloride: 105 mmol/L (ref 98–111)
Creatinine, Ser: 1.22 mg/dL — ABNORMAL HIGH (ref 0.44–1.00)
GFR calc Af Amer: 57 mL/min — ABNORMAL LOW (ref >60)
GFR calc non Af Amer: 49 mL/min — ABNORMAL LOW (ref >60)
Glucose, Bld: 108 mg/dL — ABNORMAL HIGH (ref 70–99)
Potassium: 4.6 mmol/L (ref 3.5–5.1)
Sodium: 139 mmol/L (ref 135–145)
Total Bilirubin: 0.6 mg/dL (ref 0.3–1.2)
Total Protein: 7.5 g/dL (ref 6.5–8.1)

## 2018-07-06 LAB — CBC WITH DIFFERENTIAL/PLATELET
Abs Immature Granulocytes: 0.03 10*3/uL (ref 0.00–0.07)
Basophils Absolute: 0.1 10*3/uL (ref 0.0–0.1)
Basophils Relative: 1 %
Eosinophils Absolute: 0.3 10*3/uL (ref 0.0–0.5)
Eosinophils Relative: 3 %
HCT: 39.3 % (ref 36.0–46.0)
Hemoglobin: 13 g/dL (ref 12.0–15.0)
Immature Granulocytes: 0 %
Lymphocytes Relative: 28 %
Lymphs Abs: 2.1 10*3/uL (ref 0.7–4.0)
MCH: 28.9 pg (ref 26.0–34.0)
MCHC: 33.1 g/dL (ref 30.0–36.0)
MCV: 87.3 fL (ref 80.0–100.0)
Monocytes Absolute: 0.6 10*3/uL (ref 0.1–1.0)
Monocytes Relative: 8 %
Neutro Abs: 4.5 10*3/uL (ref 1.7–7.7)
Neutrophils Relative %: 60 %
Platelets: 252 10*3/uL (ref 150–400)
RBC: 4.5 MIL/uL (ref 3.87–5.11)
RDW: 14.2 % (ref 11.5–15.5)
WBC: 7.5 10*3/uL (ref 4.0–10.5)
nRBC: 0 % (ref 0.0–0.2)

## 2018-07-08 ENCOUNTER — Encounter: Payer: Self-pay | Admitting: *Deleted

## 2018-07-08 ENCOUNTER — Other Ambulatory Visit: Payer: 59

## 2018-07-08 ENCOUNTER — Inpatient Hospital Stay: Payer: 59 | Admitting: Oncology

## 2018-07-16 ENCOUNTER — Encounter: Payer: Self-pay | Admitting: *Deleted

## 2018-08-04 ENCOUNTER — Telehealth: Payer: Self-pay | Admitting: *Deleted

## 2018-08-04 NOTE — Telephone Encounter (Signed)
Please reschedule. Thank you.

## 2018-08-04 NOTE — Telephone Encounter (Signed)
Message sent to scheduling 

## 2018-08-04 NOTE — Telephone Encounter (Signed)
Pharmacy requesting refill of her Letrozole, She missed her Virtual visit 07/08/18 and has no further follow up appts with you. Please advise

## 2018-08-06 ENCOUNTER — Ambulatory Visit: Payer: 59 | Admitting: Radiation Oncology

## 2018-08-12 ENCOUNTER — Telehealth: Payer: Self-pay | Admitting: Oncology

## 2018-08-13 ENCOUNTER — Inpatient Hospital Stay: Payer: 59 | Admitting: Oncology

## 2018-08-13 NOTE — Progress Notes (Signed)
Pt no showed- reschedule

## 2018-08-19 ENCOUNTER — Telehealth: Payer: Self-pay | Admitting: Oncology

## 2018-08-20 ENCOUNTER — Inpatient Hospital Stay: Payer: 59 | Attending: Oncology | Admitting: Oncology

## 2018-08-20 ENCOUNTER — Encounter: Payer: Self-pay | Admitting: Oncology

## 2018-08-20 ENCOUNTER — Other Ambulatory Visit: Payer: Self-pay

## 2018-08-20 DIAGNOSIS — Z7901 Long term (current) use of anticoagulants: Secondary | ICD-10-CM | POA: Diagnosis not present

## 2018-08-20 DIAGNOSIS — N183 Chronic kidney disease, stage 3 unspecified: Secondary | ICD-10-CM

## 2018-08-20 DIAGNOSIS — C50411 Malignant neoplasm of upper-outer quadrant of right female breast: Secondary | ICD-10-CM

## 2018-08-20 DIAGNOSIS — Z170 Estrogen receptor positive status [ER+]: Secondary | ICD-10-CM

## 2018-08-20 DIAGNOSIS — Z79811 Long term (current) use of aromatase inhibitors: Secondary | ICD-10-CM | POA: Diagnosis not present

## 2018-08-20 NOTE — Progress Notes (Signed)
Patient contacted via phone for telehealth visit. No concerns voiced.  

## 2018-08-20 NOTE — Progress Notes (Addendum)
HEMATOLOGY-ONCOLOGY TeleHEALTH VISIT PROGRESS NOTE  I connected with Heather Conrad on 08/20/18 at  9:00 AM EDT by video enabled telemedicine visit and verified that I am speaking with the correct person using two identifiers. I discussed the limitations, risks, security and privacy concerns of performing an evaluation and management service by telemedicine and the availability of in-person appointments. I also discussed with the patient that there may be a patient responsible charge related to this service. The patient expressed understanding and agreed to proceed.   Other persons participating in the visit and their role in the encounter:  Janeann Merl, RN, check in patient.   Patient's location: Home  Provider's location:home office Chief Complaint: Follow-up for management of breast cancer.   INTERVAL HISTORY Heather Conrad is a 58 y.o. female who has above history reviewed by me today presents for follow up visit for management of breast cancer   Problems and complaints are listed below:  Patient is taking letrozole 2.5 mg daily.  Reports tolerating well. Denies experiencing side effects.   No particular concerns of her breast and no new complaints today. Mammogram was done in April 2020. Reports taking Xarelto. No bleeding.  Review of Systems  Constitutional: Negative for appetite change, chills, fatigue and fever.  HENT:   Negative for hearing loss and voice change.   Eyes: Negative for eye problems.  Respiratory: Negative for chest tightness and cough.   Cardiovascular: Negative for chest pain.  Gastrointestinal: Negative for abdominal distention, abdominal pain and blood in stool.  Endocrine: Negative for hot flashes.  Genitourinary: Negative for difficulty urinating and frequency.   Musculoskeletal: Negative for arthralgias.  Skin: Negative for itching and rash.  Neurological: Negative for extremity weakness.  Hematological: Negative for adenopathy.   Psychiatric/Behavioral: Negative for confusion.    Past Medical History:  Diagnosis Date  . Acute renal failure (ARF) ([place]) 01/2012  . Anxiety   . Arthritis 2003   SPINE AND RIGHT KNEE  . Breast cancer in female [facility]) 07/16/2017   Right-invasive mammary cancer  . Cancer ([place])   . Cough   . Dry skin   . Dyspnea   . Dysrhythmia 01/2017   atrial fibrillation  . Fatigue   . Heart murmur    per pt report  . History of kidney stones   . History of MRSA infection 2013   leg, abdomen, arm  . HPV in female   . Hyperlipidemia 2018  . Hypertension   . Kidney stone 01/2017  . Leg cramping   . Leg pain   . Lumbar stenosis   . Nervousness   . Obesity 01/2017  . Palpitations   . Personal history of radiation therapy   . Pyelonephritis   . Shortness of breath on exertion   . Stress   . Swelling of both lower extremities   . UTI (urinary tract infection)   . Vitamin D deficiency   . Weakness    Past Surgical History:  Procedure Laterality Date  . BREAST BIOPSY Right 07/16/2017   Invasive mammary carcinoma  . BREAST LUMPECTOMY Right 09/01/2017   F/U radation, clear margins  . BREAST LUMPECTOMY WITH RADIOACTIVE SEED AND SENTINEL LYMPH NODE BIOPSY Right 09/01/2017   Procedure: BREAST LUMPECTOMY WITH RADIOACTIVE SEED AND SENTINEL LYMPH NODE BIOPSY;  Surgeon: Alphonsa Overall, MD;  Location: [place];  Service: General;  Laterality: Right;  . CHOLECYSTECTOMY     1992  . DILATION AND CURETTAGE OF UTERUS    . EXTRACORPOREAL SHOCK WAVE  LITHOTRIPSY Left 02/13/2017   Procedure: EXTRACORPOREAL SHOCK WAVE LITHOTRIPSY (ESWL);  Surgeon: Royston Cowper, MD;  Location: [facility];  Service: Urology;  Laterality: Left;  . FOOT FRACTURE SURGERY Right 2018   repair of trigonem of ankle. endoscopic fasciotomy  . INDUCED ABORTION    . KNEE SURGERY Right    knee scope for torn meniscus  . LUMBAR LAMINECTOMY/DECOMPRESSION MICRODISCECTOMY Bilateral 10/15/2017   Procedure: Laminectomy and Foraminotomy -  Lumbar one-two, Lumbar two-three, Lumbar three-four  - bilateral;  Surgeon: Eustace Moore, MD;  Location: [place];  Service: Neurosurgery;  Laterality: Bilateral;    Family History  Problem Relation Age of Onset  . Heart disease Father        deceased 29; had 61 siblings  . Hypertension Father   . Kidney disease Father   . Colon cancer Paternal Uncle 77       deceased 22s  . Hypertension Paternal Uncle   . Diabetes Maternal Aunt   . Hypertension Maternal Aunt   . Heart disease Mother        deceased at 90; had 7 siblings  . Hypertension Mother   . Hyperlipidemia Mother   . Depression Mother   . Anxiety disorder Mother   . Sleep apnea Mother   . Obesity Mother   . Uterine cancer Sister 17       currently 14; no children  . Breast cancer Cousin 22       currently 87; daughter of unaffected maternal aunt    Social History   Socioeconomic History  . Marital status: Single    Spouse name: Not on file  . Number of children: 2  . Years of education: 82  . Highest education level: Not on file  Occupational History  . Occupation: Loss adjuster, chartered  Social Needs  . Financial resource strain: Not on file  . Food insecurity:    Worry: Not on file    Inability: Not on file  . Transportation needs:    Medical: Not on file    Non-medical: Not on file  Tobacco Use  . Smoking status: Never Smoker  . Smokeless tobacco: Never Used  Substance and Sexual Activity  . Alcohol use: Yes    Comment: rare  . Drug use: No  . Sexual activity: Not Currently  Lifestyle  . Physical activity:    Days per week: Not on file    Minutes per session: Not on file  . Stress: Not on file  Relationships  . Social connections:    Talks on phone: Not on file    Gets together: Not on file    Attends religious service: Not on file    Active member of club or organization: Not on file    Attends meetings of clubs or organizations: Not on file    Relationship status: Not on file  . Intimate partner  violence:    Fear of current or ex partner: Not on file    Emotionally abused: Not on file    Physically abused: Not on file    Forced sexual activity: Not on file  Other Topics Concern  . Not on file  Social History Narrative  . Not on file    Current Outpatient Medications on File Prior to Visit  Medication Sig Dispense Refill  . albuterol (VENTOLIN HFA) 108 (90 Base) MCG/ACT inhaler     . amiodarone (PACERONE) 200 MG tablet Take 200 mg by mouth 2 (two) times a week. Sunday and  Wednesday    . Cholecalciferol (VITAMIN D3) 50000 units CAPS Take 50,000 Units by mouth every Sunday.   2  . citalopram (CELEXA) 40 MG tablet Take 40 mg by mouth daily.  6  . fluticasone (FLONASE) 50 MCG/ACT nasal spray Place 1 spray into both nostrils as needed.  6  . furosemide (LASIX) 20 MG tablet Take 20 mg by mouth daily.  2  . letrozole (FEMARA) 2.5 MG tablet Take 1 tablet (2.5 mg total) by mouth daily. 90 tablet 1  . lisinopril-hydrochlorothiazide (PRINZIDE,ZESTORETIC) 10-12.5 MG tablet Take 1 tablet by mouth daily.  2  . XARELTO 20 MG TABS tablet TK 1 T PO QD  2  . meloxicam (MOBIC) 15 MG tablet Take 15 mg by mouth daily.     No current facility-administered medications on file prior to visit.     No Known Allergies     Observations/Objective: Today's Vitals   08/20/18 0842  PainSc: 0-No pain   There is no height or weight on file to calculate BMI.  Physical Exam  CBC    Component Value Date/Time   WBC 7.5 07/06/2018 1146   RBC 4.50 07/06/2018 1146   HGB 13.0 07/06/2018 1146   HGB 13.6 11/25/2017 1106   HCT 39.3 07/06/2018 1146   HCT 41.3 11/25/2017 1106   PLT 252 07/06/2018 1146   PLT 318 02/24/2015 1352   MCV 87.3 07/06/2018 1146   MCV 92 11/25/2017 1106   MCV 90 05/06/2011 2025   MCH 28.9 07/06/2018 1146   MCHC 33.1 07/06/2018 1146   RDW 14.2 07/06/2018 1146   RDW 13.1 11/25/2017 1106   RDW 13.8 05/06/2011 2025   LYMPHSABS 2.1 07/06/2018 1146   LYMPHSABS 1.8 11/25/2017 1106    MONOABS 0.6 07/06/2018 1146   EOSABS 0.3 07/06/2018 1146   EOSABS 0.2 11/25/2017 1106   BASOSABS 0.1 07/06/2018 1146   BASOSABS 0.0 11/25/2017 1106    CMP     Component Value Date/Time   NA 139 07/06/2018 1146   NA 139 11/25/2017 1106   NA 133 (L) 05/06/2011 2025   K 4.6 07/06/2018 1146   K 4.0 05/06/2011 2025   CL 105 07/06/2018 1146   CL 98 05/06/2011 2025   CO2 26 07/06/2018 1146   CO2 22 05/06/2011 2025   GLUCOSE 108 (H) 07/06/2018 1146   GLUCOSE 95 05/06/2011 2025   BUN 25 (H) 07/06/2018 1146   BUN 22 11/25/2017 1106   BUN 38 (H) 05/06/2011 2025   CREATININE 1.22 (H) 07/06/2018 1146   CREATININE 3.09 (H) 05/06/2011 2025   CALCIUM 9.3 07/06/2018 1146   CALCIUM 7.5 (L) 05/06/2011 2025   PROT 7.5 07/06/2018 1146   PROT 7.0 11/25/2017 1106   PROT 6.3 (L) 05/06/2011 2025   ALBUMIN 3.9 07/06/2018 1146   ALBUMIN 4.3 11/25/2017 1106   ALBUMIN 2.8 (L) 05/06/2011 2025   AST 22 07/06/2018 1146   AST 28 05/06/2011 2025   ALT 24 07/06/2018 1146   ALT 22 05/06/2011 2025   ALKPHOS 66 07/06/2018 1146   ALKPHOS 76 05/06/2011 2025   BILITOT 0.6 07/06/2018 1146   BILITOT 0.7 11/25/2017 1106   BILITOT 3.1 (H) 05/06/2011 2025   GFRNONAA 49 (L) 07/06/2018 1146   GFRNONAA 17 (L) 05/06/2011 2025   GFRAA 57 (L) 07/06/2018 1146   GFRAA 21 (L) 05/06/2011 2025     Assessment and Plan: 1. Malignant neoplasm of upper-outer quadrant of right breast in female, estrogen receptor positive ([place])   2.  Aromatase inhibitor use   3. Chronic anticoagulation   4. CKD (chronic kidney disease) stage 3, GFR 30-59 ml/min (HCC)     #History of stage IA right breast cancer status post lumpectomy and sentinel lymph node biopsy. Tolerating aromatase inhibitor. Continue letrozole 2.5 mg daily. Planned duration of treatment is 10 years if tolerating  Bilateral diagnostic mammogram 07/06/2018 was intubated reviewed by me and discussed with patient. No mammographic evidence of malignancy.  Chronic  aromatase use, will need to obtain bone density in the future. Recommend patient to continue take calcium and vitamin D  Chronic anticoagulation on Xarelto. Not managed by me.  Labs are reviewed and discussed with patient. Hemoglobin stable,  Chronic kidney disease, kidney function continues to decrease.  Discussed about avoiding NSAIDS, stay hydrated.   recommend patient to discuss with primary care physician for further management/referring to nephrology for further evaluation.   Follow Up Instructions: 3 months.  Cc Perrin Maltese, MD  I discussed the assessment and treatment plan with the patient. The patient was provided an opportunity to ask questions and all were answered. The patient agreed with the plan and demonstrated an understanding of the instructions.  The patient was advised to call back or seek an in-person evaluation if the symptoms worsen or if the condition fails to improve as anticipated.    Earlie Server, MD 08/20/2018 11:16 AM

## 2018-08-28 ENCOUNTER — Encounter: Payer: Self-pay | Admitting: *Deleted

## 2018-10-15 ENCOUNTER — Ambulatory Visit: Payer: 59 | Admitting: Radiation Oncology

## 2018-10-19 ENCOUNTER — Other Ambulatory Visit: Payer: Self-pay | Admitting: *Deleted

## 2018-10-19 MED ORDER — LETROZOLE 2.5 MG PO TABS: 2.5000 mg | ORAL_TABLET | Freq: Every day | ORAL | 1 refills | Status: DC

## 2018-11-17 ENCOUNTER — Telehealth: Payer: Self-pay

## 2018-11-17 NOTE — Telephone Encounter (Signed)
Called patient to prechart visit for 9/2, patient would like to reschedule message sent to [facility] to reschedule appointment.

## 2018-11-18 ENCOUNTER — Ambulatory Visit: Payer: 59 | Admitting: Radiation Oncology

## 2018-11-18 ENCOUNTER — Inpatient Hospital Stay: Payer: 59

## 2018-11-18 ENCOUNTER — Inpatient Hospital Stay: Payer: 59 | Admitting: Oncology

## 2018-12-18 ENCOUNTER — Inpatient Hospital Stay: Payer: 59 | Admitting: Oncology

## 2018-12-18 ENCOUNTER — Ambulatory Visit: Payer: 59 | Attending: Radiation Oncology | Admitting: Radiation Oncology

## 2018-12-18 ENCOUNTER — Inpatient Hospital Stay: Payer: 59

## 2019-05-03 ENCOUNTER — Other Ambulatory Visit: Payer: Self-pay | Admitting: *Deleted

## 2019-05-03 NOTE — Telephone Encounter (Signed)
Fax received from pharmacy for refill of patients letrozole, but patient No Showed for her last appointment and has no future follow up appts. I called patient and advised that she must schedule an appointment within the next 30 days for a refill to be sent and once she is scheduled, a refill will be sent and she must keep appointment for any further refills to be sent. She agreed and was transferred to scheduling line

## 2019-05-05 MED ORDER — LETROZOLE 2.5 MG PO TABS: 2.5000 mg | ORAL_TABLET | Freq: Every day | ORAL | 1 refills | Status: DC

## 2019-05-11 ENCOUNTER — Inpatient Hospital Stay: Payer: 59 | Admitting: Oncology

## 2019-05-11 ENCOUNTER — Inpatient Hospital Stay: Payer: 59

## 2019-05-24 ENCOUNTER — Other Ambulatory Visit: Payer: Self-pay

## 2019-05-24 ENCOUNTER — Inpatient Hospital Stay: Payer: 59 | Attending: Oncology

## 2019-05-24 ENCOUNTER — Encounter: Payer: Self-pay | Admitting: Oncology

## 2019-05-24 ENCOUNTER — Inpatient Hospital Stay: Payer: 59 | Admitting: Oncology

## 2019-05-24 VITALS — BP 165/66 | HR 64 | Temp 97.8°F | Wt 329.4 lb

## 2019-05-24 DIAGNOSIS — Z79811 Long term (current) use of aromatase inhibitors: Secondary | ICD-10-CM | POA: Insufficient documentation

## 2019-05-24 DIAGNOSIS — Z836 Family history of other diseases of the respiratory system: Secondary | ICD-10-CM | POA: Diagnosis not present

## 2019-05-24 DIAGNOSIS — M2550 Pain in unspecified joint: Secondary | ICD-10-CM | POA: Insufficient documentation

## 2019-05-24 DIAGNOSIS — Z803 Family history of malignant neoplasm of breast: Secondary | ICD-10-CM | POA: Insufficient documentation

## 2019-05-24 DIAGNOSIS — Z8249 Family history of ischemic heart disease and other diseases of the circulatory system: Secondary | ICD-10-CM | POA: Insufficient documentation

## 2019-05-24 DIAGNOSIS — C50411 Malignant neoplasm of upper-outer quadrant of right female breast: Secondary | ICD-10-CM | POA: Diagnosis present

## 2019-05-24 DIAGNOSIS — Z79899 Other long term (current) drug therapy: Secondary | ICD-10-CM | POA: Diagnosis not present

## 2019-05-24 DIAGNOSIS — Z8349 Family history of other endocrine, nutritional and metabolic diseases: Secondary | ICD-10-CM | POA: Diagnosis not present

## 2019-05-24 DIAGNOSIS — Z170 Estrogen receptor positive status [ER+]: Secondary | ICD-10-CM | POA: Insufficient documentation

## 2019-05-24 DIAGNOSIS — Z923 Personal history of irradiation: Secondary | ICD-10-CM | POA: Insufficient documentation

## 2019-05-24 DIAGNOSIS — Z7901 Long term (current) use of anticoagulants: Secondary | ICD-10-CM | POA: Insufficient documentation

## 2019-05-24 DIAGNOSIS — Z8614 Personal history of Methicillin resistant Staphylococcus aureus infection: Secondary | ICD-10-CM | POA: Insufficient documentation

## 2019-05-24 DIAGNOSIS — Z833 Family history of diabetes mellitus: Secondary | ICD-10-CM | POA: Diagnosis not present

## 2019-05-24 DIAGNOSIS — R232 Flushing: Secondary | ICD-10-CM | POA: Diagnosis not present

## 2019-05-24 DIAGNOSIS — G8929 Other chronic pain: Secondary | ICD-10-CM | POA: Diagnosis not present

## 2019-05-24 DIAGNOSIS — Z818 Family history of other mental and behavioral disorders: Secondary | ICD-10-CM | POA: Diagnosis not present

## 2019-05-24 DIAGNOSIS — N1831 Chronic kidney disease, stage 3a: Secondary | ICD-10-CM

## 2019-05-24 DIAGNOSIS — Z800 Family history of malignant neoplasm of digestive organs: Secondary | ICD-10-CM | POA: Insufficient documentation

## 2019-05-24 DIAGNOSIS — Z841 Family history of disorders of kidney and ureter: Secondary | ICD-10-CM | POA: Insufficient documentation

## 2019-05-24 DIAGNOSIS — Z87442 Personal history of urinary calculi: Secondary | ICD-10-CM | POA: Insufficient documentation

## 2019-05-24 DIAGNOSIS — Z8049 Family history of malignant neoplasm of other genital organs: Secondary | ICD-10-CM | POA: Diagnosis not present

## 2019-05-24 LAB — CBC WITH DIFFERENTIAL/PLATELET
Abs Immature Granulocytes: 0.03 10*3/uL (ref 0.00–0.07)
Basophils Absolute: 0.1 10*3/uL (ref 0.0–0.1)
Basophils Relative: 1 %
Eosinophils Absolute: 0.2 10*3/uL (ref 0.0–0.5)
Eosinophils Relative: 3 %
HCT: 40.3 % (ref 36.0–46.0)
Hemoglobin: 13.2 g/dL (ref 12.0–15.0)
Immature Granulocytes: 0 %
Lymphocytes Relative: 30 %
Lymphs Abs: 2.4 10*3/uL (ref 0.7–4.0)
MCH: 29.5 pg (ref 26.0–34.0)
MCHC: 32.8 g/dL (ref 30.0–36.0)
MCV: 90.2 fL (ref 80.0–100.0)
Monocytes Absolute: 0.6 10*3/uL (ref 0.1–1.0)
Monocytes Relative: 7 %
Neutro Abs: 4.8 10*3/uL (ref 1.7–7.7)
Neutrophils Relative %: 59 %
Platelets: 244 10*3/uL (ref 150–400)
RBC: 4.47 MIL/uL (ref 3.87–5.11)
RDW: 13.3 % (ref 11.5–15.5)
WBC: 8.1 10*3/uL (ref 4.0–10.5)
nRBC: 0 % (ref 0.0–0.2)

## 2019-05-24 LAB — COMPREHENSIVE METABOLIC PANEL
ALT: 28 U/L (ref 0–44)
AST: 24 U/L (ref 15–41)
Albumin: 3.9 g/dL (ref 3.5–5.0)
Alkaline Phosphatase: 78 U/L (ref 38–126)
Anion gap: 10 (ref 5–15)
BUN: 27 mg/dL — ABNORMAL HIGH (ref 6–20)
CO2: 25 mmol/L (ref 22–32)
Calcium: 9.2 mg/dL (ref 8.9–10.3)
Chloride: 103 mmol/L (ref 98–111)
Creatinine, Ser: 1.22 mg/dL — ABNORMAL HIGH (ref 0.44–1.00)
GFR calc Af Amer: 57 mL/min — ABNORMAL LOW (ref >60)
GFR calc non Af Amer: 49 mL/min — ABNORMAL LOW (ref >60)
Glucose, Bld: 114 mg/dL — ABNORMAL HIGH (ref 70–99)
Potassium: 4 mmol/L (ref 3.5–5.1)
Sodium: 138 mmol/L (ref 135–145)
Total Bilirubin: 0.7 mg/dL (ref 0.3–1.2)
Total Protein: 7.1 g/dL (ref 6.5–8.1)

## 2019-05-24 NOTE — Progress Notes (Signed)
Patient does not offer any problems today.  

## 2019-05-24 NOTE — Progress Notes (Signed)
Hematology/Oncology follow up note [facility] [phone] [phone]) [phone]   Patient Care Team: Perrin Maltese, MD as PCP - General (Internal Medicine) Dionisio David, MD as Consulting Physician (Cardiology) Earlie Server, MD as Consulting Physician (Oncology) Elsie Saas, MD as Consulting Physician (Orthopedic Surgery)  REFERRING PROVIDER: Perrin Maltese, MD  REASON FOR VISIT Follow up for treatment of of breast cancer  HISTORY OF PRESENTING ILLNESS:  Heather Conrad is a  59 y.o.  female with PMH listed below who was referred to me for evaluation of breast cancer.  Recent screening Mammogram showed 1. Complex mass in the right breast, predominantly cystic, with abnormal tissue along its anterior wall. Biopsy is recommended. 2. Benign left breast calcifications.Sonographic evaluation of the right axilla shows no enlarged or abnormal lymph nodes. 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   # 09/01/2017 [name].  she is s/p right lumpectomy and right sentinel lymph node biopsy. 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  #Oncotype Dx recurrence score 13, absoluate chemotherapy risk<1%. Discussed with patient that adjuvant chemotherapy will not be offered.  #  finished adjuvant radiation 11/ 03/2017.  INTERVAL HISTORY Heather Conrad is a 59 y.o. female who has above history reviewed by me today presents for follow-up visit for management of breast cancer. pT1cN0 She tolerates Letrozole 2.5 mg daily.  manageable hot flash and chronic joint pain.  No new complaints.   Review of Systems  Constitutional: Negative for appetite change, chills, fatigue and fever.  HENT:   Negative for hearing loss and voice change.   Eyes: Negative for eye problems.  Respiratory: Negative for chest tightness  and cough.   Cardiovascular: Negative for chest pain.  Gastrointestinal: Negative for abdominal distention, abdominal pain and blood in stool.  Endocrine: Positive for hot flashes.  Genitourinary: Negative for difficulty urinating and frequency.   Musculoskeletal: Positive for arthralgias.  Skin: Negative for itching and rash.  Neurological: Negative for extremity weakness.  Hematological: Negative for adenopathy.  Psychiatric/Behavioral: Negative for confusion.     MEDICAL HISTORY:  Past Medical History:  Diagnosis Date  . Acute renal failure (ARF) (Heather Conrad) 01/2012  . Anxiety   . Arthritis 2003   SPINE AND RIGHT KNEE  . Breast cancer in female [facility]) 07/16/2017   Right-invasive mammary cancer  . Cancer ([place])   . Cough   . Dry skin   . Dyspnea   . Dysrhythmia 01/2017   atrial fibrillation  . Fatigue   . Heart murmur    per pt report  . History of kidney stones   . History of MRSA infection 2013   leg, abdomen, arm  . HPV in female   . Hyperlipidemia 2018  . Hypertension   . Kidney stone 01/2017  . Leg cramping   . Leg pain   . Lumbar stenosis   . Nervousness   . Obesity 01/2017  . Palpitations   . Personal history of radiation therapy   . Pyelonephritis   . Shortness of breath on exertion   . Stress   . Swelling of both lower extremities   . UTI (urinary tract infection)   . Vitamin D deficiency   . Weakness     SURGICAL HISTORY: Past Surgical History:  Procedure Laterality Date  . BREAST BIOPSY Right 07/16/2017   Invasive mammary carcinoma  . BREAST  LUMPECTOMY Right 09/01/2017   F/U radation, clear margins  . BREAST LUMPECTOMY WITH RADIOACTIVE SEED AND SENTINEL LYMPH NODE BIOPSY Right 09/01/2017   Procedure: BREAST LUMPECTOMY WITH RADIOACTIVE SEED AND SENTINEL LYMPH NODE BIOPSY;  Surgeon: Alphonsa Overall, MD;  Location: [place];  Service: General;  Laterality: Right;  . CHOLECYSTECTOMY     1992  . DILATION AND CURETTAGE OF UTERUS    . EXTRACORPOREAL SHOCK  WAVE LITHOTRIPSY Left 02/13/2017   Procedure: EXTRACORPOREAL SHOCK WAVE LITHOTRIPSY (ESWL);  Surgeon: Royston Cowper, MD;  Location: [facility];  Service: Urology;  Laterality: Left;  . FOOT FRACTURE SURGERY Right 2018   repair of trigonem of ankle. endoscopic fasciotomy  . INDUCED ABORTION    . KNEE SURGERY Right    knee scope for torn meniscus  . LUMBAR LAMINECTOMY/DECOMPRESSION MICRODISCECTOMY Bilateral 10/15/2017   Procedure: Laminectomy and Foraminotomy - Lumbar one-two, Lumbar two-three, Lumbar three-four  - bilateral;  Surgeon: Eustace Moore, MD;  Location: [place];  Service: Neurosurgery;  Laterality: Bilateral;    SOCIAL HISTORY: Social History   Socioeconomic History  . Marital status: Single    Spouse name: Not on file  . Number of children: 2  . Years of education: 33  . Highest education level: Not on file  Occupational History  . Occupation: Loss adjuster, chartered  Tobacco Use  . Smoking status: Never Smoker  . Smokeless tobacco: Never Used  Substance and Sexual Activity  . Alcohol use: Yes    Comment: rare  . Drug use: No  . Sexual activity: Not Currently  Other Topics Concern  . Not on file  Social History Narrative  . Not on file   Social Determinants of Health   Financial Resource Strain:   . Difficulty of Paying Living Expenses: Not on file  Food Insecurity:   . Worried About Charity fundraiser in the Last Year: Not on file  . Ran Out of Food in the Last Year: Not on file  Transportation Needs:   . Lack of Transportation (Medical): Not on file  . Lack of Transportation (Non-Medical): Not on file  Physical Activity:   . Days of Exercise per Week: Not on file  . Minutes of Exercise per Session: Not on file  Stress:   . Feeling of Stress : Not on file  Social Connections:   . Frequency of Communication with Friends and Family: Not on file  . Frequency of Social Gatherings with Friends and Family: Not on file  . Attends Religious Services: Not on file  .  Active Member of Clubs or Organizations: Not on file  . Attends Archivist Meetings: Not on file  . Marital Status: Not on file  Intimate Partner Violence:   . Fear of Current or Ex-Partner: Not on file  . Emotionally Abused: Not on file  . Physically Abused: Not on file  . Sexually Abused: Not on file    FAMILY HISTORY: Family History  Problem Relation Age of Onset  . Heart disease Father        deceased 80; had 81 siblings  . Hypertension Father   . Kidney disease Father   . Colon cancer Paternal Uncle 69       deceased 22s  . Hypertension Paternal Uncle   . Diabetes Maternal Aunt   . Hypertension Maternal Aunt   . Heart disease Mother        deceased at 61; had 7 siblings  . Hypertension Mother   . Hyperlipidemia  Mother   . Depression Mother   . Anxiety disorder Mother   . Sleep apnea Mother   . Obesity Mother   . Uterine cancer Sister 50       currently 60; no children  . Breast cancer Cousin 71       currently 44; daughter of unaffected maternal aunt    ALLERGIES:  has No Known Allergies.  MEDICATIONS:  Current Outpatient Medications  Medication Sig Dispense Refill  . albuterol (VENTOLIN HFA) 108 (90 Base) MCG/ACT inhaler     . amiodarone (PACERONE) 200 MG tablet Take 200 mg by mouth 2 (two) times a week. Sunday and Wednesday    . hydrochlorothiazide (HYDRODIURIL) 12.5 MG tablet Take 12.5 mg by mouth daily.    Marland Kitchen letrozole (FEMARA) 2.5 MG tablet Take 1 tablet (2.5 mg total) by mouth daily. 90 tablet 1  . XARELTO 20 MG TABS tablet TK 1 T PO QD  2  . Cholecalciferol (VITAMIN D3) 50000 units CAPS Take 50,000 Units by mouth every Sunday.   2  . citalopram (CELEXA) 40 MG tablet Take 40 mg by mouth daily.  6  . fluticasone (FLONASE) 50 MCG/ACT nasal spray Place 1 spray into both nostrils as needed.  6  . furosemide (LASIX) 20 MG tablet Take 20 mg by mouth daily.  2  . lisinopril-hydrochlorothiazide (PRINZIDE,ZESTORETIC) 10-12.5 MG tablet Take 1 tablet by  mouth daily.  2  . meloxicam (MOBIC) 15 MG tablet Take 15 mg by mouth daily.     No current facility-administered medications for this visit.     PHYSICAL EXAMINATION: ECOG PERFORMANCE STATUS: 1 - Symptomatic but completely ambulatory Vitals:   05/24/19 1325  BP: (!) 165/66  Pulse: 64  Temp: 97.8 F (36.6 C)  SpO2: 99%   Filed Weights   05/24/19 1325  Weight: (!) 329 lb 6.4 oz (149.4 kg)    Physical Exam Constitutional:      General: She is not in acute distress.    Appearance: She is obese.  HENT:     Head: Normocephalic and atraumatic.  Eyes:     General: No scleral icterus.    Conjunctiva/sclera: Conjunctivae normal.     Pupils: Pupils are equal, round, and reactive to light.  Cardiovascular:     Rate and Rhythm: Normal rate and regular rhythm.     Heart sounds: Normal heart sounds.  Pulmonary:     Effort: Pulmonary effort is normal. No respiratory distress.     Breath sounds: Normal breath sounds. No wheezing or rales.  Chest:     Chest wall: No tenderness.  Abdominal:     General: Bowel sounds are normal. There is no distension.     Palpations: Abdomen is soft. There is no mass.     Tenderness: There is no abdominal tenderness.  Musculoskeletal:        General: No deformity. Normal range of motion.     Cervical back: Normal range of motion and neck supple.  Lymphadenopathy:     Cervical: No cervical adenopathy.  Skin:    General: Skin is warm and dry.     Findings: No erythema or rash.  Neurological:     Mental Status: She is alert and oriented to person, place, and time.     Cranial Nerves: No cranial nerve deficit.     Coordination: Coordination normal.  Psychiatric:        Behavior: Behavior normal.        Thought Content: Thought content normal.   .  Breast exam was performed in seated position. No palpable breast mass bilaterally. No palpable axillary lymphadenopathy.    LABORATORY DATA:  I have reviewed the data as listed Lab Results    Component Value Date   WBC 8.1 05/24/2019   HGB 13.2 05/24/2019   HCT 40.3 05/24/2019   MCV 90.2 05/24/2019   PLT 244 05/24/2019   Recent Labs    07/06/18 1146 05/24/19 1309  NA 139 138  K 4.6 4.0  CL 105 103  CO2 26 25  GLUCOSE 108* 114*  BUN 25* 27*  CREATININE 1.22* 1.22*  CALCIUM 9.3 9.2  GFRNONAA 49* 49*  GFRAA 57* 57*  PROT 7.5 7.1  ALBUMIN 3.9 3.9  AST 22 24  ALT 24 28  ALKPHOS 66 78  BILITOT 0.6 0.7       ASSESSMENT & PLAN:  1. Malignant neoplasm of upper-outer quadrant of right breast in female, estrogen receptor positive ([place])   2. Aromatase inhibitor use   3. Stage 3a chronic kidney disease   Cancer Staging Malignant neoplasm of upper-outer quadrant of right breast in female, estrogen receptor positive ([place]) Staging form: Breast, AJCC 8th Edition - Clinical stage from 07/27/2017: Stage IA (cT1, cN0, cM0, G2, ER+, PR+, HER2: Equivocal) - Signed by Earlie Server, MD on 07/27/2017 - Pathologic: pT1c, pN0, cM0, ER+, PR+, HER2-, Oncotype DX score: 13 - Signed by Earlie Server, MD on 09/22/2017  #Stage 1A right breast cancer, ER PR positive, HER-2 negative. Oncotype score 13 Status post lumpectomy and sentinel lymph node biopsy, status post adjuvant radiation. Currently on aromatase inhibitor with letrozole.Labs are reviewed and discussed with patient.  Patient tolerates well.  Continue current regimen. Recommend patient continue take calcium and vitamin D supplementation. Discussed with patient and recommend DEXA every 2 years.  She recalls last one was done about 2 years ago.  She will talk to her primary care provider for repeat study. # CKD, creatinine stable. Avoid neprotoxin.  #Chronic anticoagulation on Xarelto.  Not managed by me,  #Patient is due for bilateral diagnostic mammogram in April 2021.  I will obtain. We spent sufficient time to discuss many aspect of care, questions were answered to patient's satisfaction.  Return of visit: 6 months.   Earlie Server,  MD, PhD Hematology Oncology [facility] Pager- [phone] 05/24/2019

## 2019-05-25 ENCOUNTER — Ambulatory Visit: Payer: 59 | Admitting: Oncology

## 2019-05-25 ENCOUNTER — Other Ambulatory Visit: Payer: 59

## 2019-06-03 NOTE — Progress Notes (Signed)
Gynecology Annual Exam  PCP: Perrin Maltese, MD  Chief Complaint:  Chief Complaint  Patient presents with  . Gynecologic Exam    History of Present Illness:Heather Conrad is a 59 year old Caucasian/White female, G3 P2012, who presents for her gyn exam. She is having no significant gyn problem. Has not had any spotting   The patient's past medical history is notable for hypertension,  right invasive breast cancer, morbid obesity, renal stones, atrial fibrillation (is on Xarelto), a history of MRSA on the skin of her legs, arms, abdomen in 2013.  She had a right breast lumpectomy 09/01/2017 and received radiation therapy. She is currently taking letrozole.  She also had a laminectomy 10/15/2017 of L1-2, L2-3, and L3-4.  Since her last annual GYN exam dated 01/06/2018, she has gained 27# and her current BMI=60.71 kg/m2. She stopped going to the [facility] and Wellness program-was not seeing any improvement on this program.  She has been limited in exercise because of her back and knee problems. Has pain in her left knee>right knee. She has been having more problems with arthritis in her left knee and just recently had a cortisone injection. 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  She is not sexually active. She has been sexually active in the past but not currently.  Her most recent pap smear was obtained 01/06/2018 and was NIL/negative HRHPV. Hx of abnormal Pap smears/ positive HRHPV and had a normal colposcopy in 2012. Paps since 2014 have been normal.  Her most recent mammogram obtained on 07/06/2018 and was [place] 2. She has a diagnostic mammogram scheduled for next month There is a family history of breast cancer in a maternal cousin There is no family history of ovarian cancer.  She has not had a colonoscopy and is eligible. Had seen Dr Arne Cleveland at [facility] last year and the colonoscopy was cancelled due to Covid.   Reports having a DEXA scan in 2019 that was normal . It was done at her PCP office. Dr Tasia Catchings, her oncologist, wants to have her repeat her DEXA there this year.  The patient does do self breast exams.  The patient does not smoke.  The patient rarely drinks alcohol.  The patient does not use illegal drugs.  The patient does not exercise.  The patient may get adequate calcium in her diet.  She had a recent cholesterol screen in 2019 that was OK. Her most recent creatinine is 1.22    Review of Systems: Review of Systems  Constitutional: Positive for malaise/fatigue (and change in appetite). Negative for chills, fever and weight loss.       Positive for weight gain  HENT: Negative for congestion, sinus pain and sore throat.   Eyes: Negative for blurred vision and pain.  Respiratory: Negative for hemoptysis, shortness of breath and wheezing.   Cardiovascular: Negative for chest pain, palpitations and leg swelling.  Gastrointestinal: Negative for abdominal pain, blood in stool, diarrhea, heartburn, nausea and vomiting.  Genitourinary: Negative for dysuria, frequency, hematuria and urgency.       Positive for amenorrhea  Musculoskeletal: Positive for joint pain (knees). Negative for back pain and myalgias.  Skin: Negative for itching and rash.  Neurological: Negative for dizziness, tingling and headaches.  Endo/Heme/Allergies: Negative for environmental allergies and polydipsia. Does not bruise/bleed easily.       Negative for hot flashes  Psychiatric/Behavioral: Negative for depression. The patient is not nervous/anxious and does not have insomnia.   Breasts: no masses or tenderness   Past Medical History:  Past Medical History:  Diagnosis Date  . Acute renal failure (ARF) ([place]) 01/2012  . Anxiety   . Arthritis 2003   SPINE AND RIGHT KNEE  . Breast cancer in female [facility]) 07/16/2017   Right-invasive mammary cancer  . Cancer ([place])   . Cough   . Dry skin   . Dyspnea   . Dysrhythmia  01/2017   atrial fibrillation  . Fatigue   . Heart murmur    per pt report  . History of kidney stones   . History of MRSA infection 2013   leg, abdomen, arm  . HPV in female   . Hyperlipidemia 2018  . Hypertension   . Kidney stone 01/2017  . Leg cramping   . Leg pain   . Lumbar stenosis   . Nervousness   . Obesity 01/2017  . Palpitations   . Personal history of radiation therapy   . Pyelonephritis   . Shortness of breath on exertion   . Stress   . Swelling of both lower extremities   . UTI (urinary tract infection)   . Vitamin D deficiency   . Weakness     Past Surgical History:  Past Surgical History:  Procedure Laterality Date  . BREAST BIOPSY Right 07/16/2017   Invasive mammary carcinoma  . BREAST LUMPECTOMY Right 09/01/2017   F/U radation, clear margins  . BREAST LUMPECTOMY WITH RADIOACTIVE SEED AND SENTINEL LYMPH NODE BIOPSY Right 09/01/2017   Procedure: BREAST LUMPECTOMY WITH RADIOACTIVE SEED AND SENTINEL LYMPH NODE BIOPSY;  Surgeon: Alphonsa Overall, MD;  [facility];  Service: General;  Laterality: Right;  . CHOLECYSTECTOMY     1992  . DILATION AND CURETTAGE OF UTERUS    . EXTRACORPOREAL SHOCK WAVE LITHOTRIPSY Left 02/13/2017   Procedure: EXTRACORPOREAL SHOCK WAVE LITHOTRIPSY (ESWL);  Surgeon: Royston Cowper, MD;  Location: [facility];  Service: Urology;  Laterality: Left;  . FOOT FRACTURE SURGERY Right 2018   repair of trigonem of ankle. endoscopic fasciotomy  . INDUCED ABORTION    . KNEE SURGERY Right    knee scope for torn meniscus  . LUMBAR LAMINECTOMY/DECOMPRESSION MICRODISCECTOMY Bilateral 10/15/2017   Procedure: Laminectomy and Foraminotomy - Lumbar one-two, Lumbar two-three, Lumbar three-four  - bilateral;  Surgeon: Eustace Moore, MD;  Location: [place];  Service: Neurosurgery;  Laterality: Bilateral;    Family History:  Family History  Problem Relation Age of Onset  . Heart disease Father        deceased 99; had 9 siblings  . Hypertension  Father   . Kidney disease Father   . Colon cancer Paternal Uncle 65       deceased 30s  . Hypertension Paternal Uncle   . Diabetes Maternal Aunt   . Hypertension Maternal Aunt   . Heart disease Mother        deceased at 38; had 7 siblings  . Hypertension Mother   . Hyperlipidemia Mother   . Depression Mother   . Anxiety disorder Mother   . Sleep apnea Mother   . Obesity Mother   . Uterine cancer Sister 67       currently 97; no children  . Breast cancer Cousin 20       currently 47; daughter of unaffected maternal aunt    Social History:  Social History   Socioeconomic History  .  Marital status: Single    Spouse name: Not on file  . Number of children: 2  . Years of education: 51  . Highest education level: Not on file  Occupational History  . Occupation: Loss adjuster, chartered  Tobacco Use  . Smoking status: Never Smoker  . Smokeless tobacco: Never Used  Substance and Sexual Activity  . Alcohol use: Yes    Comment: rare  . Drug use: No  . Sexual activity: Not Currently    Birth control/protection: Post-menopausal  Other Topics Concern  . Not on file  Social History Narrative  . Not on file   Social Determinants of Health   Financial Resource Strain:   . Difficulty of Paying Living Expenses:   Food Insecurity:   . Worried About Charity fundraiser in the Last Year:   . Arboriculturist in the Last Year:   Transportation Needs:   . Film/video editor (Medical):   Marland Kitchen Lack of Transportation (Non-Medical):   Physical Activity:   . Days of Exercise per Week:   . Minutes of Exercise per Session:   Stress:   . Feeling of Stress :   Social Connections:   . Frequency of Communication with Friends and Family:   . Frequency of Social Gatherings with Friends and Family:   . Attends Religious Services:   . Active Member of Clubs or Organizations:   . Attends Archivist Meetings:   Marland Kitchen Marital Status:   Intimate Partner Violence:   . Fear of Current or  Ex-Partner:   . Emotionally Abused:   Marland Kitchen Physically Abused:   . Sexually Abused:     Allergies:  No Known Allergies  Medications:  Current Outpatient Medications on File Prior to Visit  Medication Sig Dispense Refill  . albuterol (VENTOLIN HFA) 108 (90 Base) MCG/ACT inhaler     . amiodarone (PACERONE) 200 MG tablet Take 200 mg by mouth 2 (two) times a week. Sunday and Wednesday    . Cholecalciferol (VITAMIN D3) 50000 units CAPS Take 50,000 Units by mouth every Sunday.   2  . furosemide (LASIX) 20 MG tablet Take 20 mg by mouth daily.  2  . hydrochlorothiazide (HYDRODIURIL) 12.5 MG tablet Take 12.5 mg by mouth daily.    Marland Kitchen letrozole (FEMARA) 2.5 MG tablet Take 1 tablet (2.5 mg total) by mouth daily. 90 tablet 1  . lisinopril (ZESTRIL) 10 MG tablet Take 10 mg by mouth daily.    Alveda Reasons 20 MG TABS tablet TK 1 T PO QD  2  . citalopram (CELEXA) 40 MG tablet Take 40 mg by mouth daily.  6  . fluticasone (FLONASE) 50 MCG/ACT nasal spray Place 1 spray into both nostrils as needed.  6  . meloxicam (MOBIC) 15 MG tablet Take 15 mg by mouth daily.     No current facility-administered medications on file prior to visit.   [facility] vitamins for Women Physical Exam Vitals: BP (!) 150/78   Pulse 60   Ht 5\' 2"  (1.575 m)   Wt (!) 332 lb (150.6 kg)   LMP  (LMP Unknown)   BMI 60.72 kg/m   General: pleasant, obese, WF in NAD, walking with a cane HEENT: normocephalic, anicteric Neck: no thyroid enlargement, no palpable nodules, no cervical lymphadenopathy  Pulmonary: No increased work of breathing, CTAB Cardiovascular: RRR, without murmur  Breast: Right breast:  S/p upper outer quadrant lumpectomy, no skin or nipple retraction present, no nipple discharge. Left breast: no masses, no  skin or nipple changes.  No axillary, infraclavicular or supraclavicular lymphadenopathy bilaterally. Abdomen: Soft, obese,  non-tender, non-distended.  Umbilicus without lesions.  No hepatomegaly or masses  palpable. Genitourinary  External: Atrophic changes.  Normal urethral meatus, normal Bartholin's and Skene's glands.    Vagina: Normal vaginal mucosa, no evidence of prolapse.    Cervix: unable to visualize, small,  Bled with blind Pap, non-tender.   Uterus: Anteverted, normal size, shape, and consistency, mobile, and non-tender  Adnexa: No adnexal masses, non-tender. Difficult exam due to obesity  Rectal: deferred  Lymphatic: no evidence of inguinal lymphadenopathy Extremities: no edema, erythema, or tenderness Neurologic: Grossly intact Psychiatric: mood appropriate, affect full     Assessment: 59 y.o. [id number] postmenopausal gyn exam History of right invasive breast cancer Morbid Obesity Plan:   1) Breast cancer screening - recommend monthly self breast exam. Mammogram as directed by her oncology doctor.  2) Colon cancer screening: discussed options for colon cancer screening. She is interested in having the colonoscopy. Given phone number for [facility] to reschedule colonoscopy  3) Cervical cancer screening - Pap was done. Desires Pap smears every 2 years  4) Discussed calcium and vitamin D3 requirements  5) Routine healthcare maintenance including cholesterol and diabetes screening managed by PCP . Discussed weight loss with patient. Recommended Weight Watchers and talking to PCP about starting [place] for weight loss  6) RTO in 1 year and prn.  Dalia Heading, CNM

## 2019-06-04 ENCOUNTER — Other Ambulatory Visit: Payer: Self-pay

## 2019-06-04 ENCOUNTER — Other Ambulatory Visit (HOSPITAL_COMMUNITY)
Admission: RE | Admit: 2019-06-04 | Discharge: 2019-06-04 | Disposition: A | Discharge status: Home / Self Care | Payer: 59 | Source: Ambulatory Visit | Attending: Certified Nurse Midwife | Admitting: Certified Nurse Midwife

## 2019-06-04 ENCOUNTER — Ambulatory Visit (INDEPENDENT_AMBULATORY_CARE_PROVIDER_SITE_OTHER): Payer: 59 | Admitting: Certified Nurse Midwife

## 2019-06-04 ENCOUNTER — Encounter: Payer: Self-pay | Admitting: Certified Nurse Midwife

## 2019-06-04 VITALS — BP 150/78 | HR 60 | Ht 62.0 in | Wt 332.0 lb

## 2019-06-04 DIAGNOSIS — Z6841 Body Mass Index (BMI) 40.0 and over, adult: Secondary | ICD-10-CM | POA: Diagnosis not present

## 2019-06-04 DIAGNOSIS — Z124 Encounter for screening for malignant neoplasm of cervix: Secondary | ICD-10-CM | POA: Insufficient documentation

## 2019-06-04 DIAGNOSIS — Z01419 Encounter for gynecological examination (general) (routine) without abnormal findings: Secondary | ICD-10-CM

## 2019-06-04 NOTE — Addendum Note (Signed)
Addended by: Dalia Heading on: 06/04/2019 10:54 AM   Modules accepted: Orders

## 2019-06-09 LAB — CYTOLOGY - PAP
Comment: NEGATIVE
Diagnosis: NEGATIVE
High risk HPV: NEGATIVE

## 2019-07-07 ENCOUNTER — Ambulatory Visit
Admission: RE | Admit: 2019-07-07 | Discharge: 2019-07-07 | Disposition: A | Discharge status: Home / Self Care | Payer: 59 | Source: Ambulatory Visit | Attending: Oncology | Admitting: Oncology

## 2019-07-07 DIAGNOSIS — Z170 Estrogen receptor positive status [ER+]: Secondary | ICD-10-CM

## 2019-07-07 DIAGNOSIS — C50411 Malignant neoplasm of upper-outer quadrant of right female breast: Secondary | ICD-10-CM | POA: Insufficient documentation

## 2019-07-07 HISTORY — DX: Malignant neoplasm of unspecified site of unspecified female breast: C50.919

## 2019-09-08 IMAGING — MR MR LUMBAR SPINE WO/W CM
4 of 7 series · 24 of 48 positions shown · IV contrast (multihance)
Comparison: Radiography 08/06/2017. MRI 10/20/2006. Abdominal CT
06/02/2017.

CLINICAL DATA: Left leg pain with difficulty walking over the last
5 weeks. Fell at work.

EXAM:
MRI LUMBAR SPINE WITHOUT AND WITH CONTRAST
TECHNIQUE: Multiplanar and multiecho pulse sequences of the lumbar spine were
obtained without and with intravenous contrast.
CONTRAST:  20mL MULTIHANCE GADOBENATE DIMEGLUMINE 529 MG/ML IV SOLN

[Series 2: T1 · sagittal · 4.0mm · 0.55mm/px · 5 of 15 slices shown (1 of 2)]
[im 1/15]
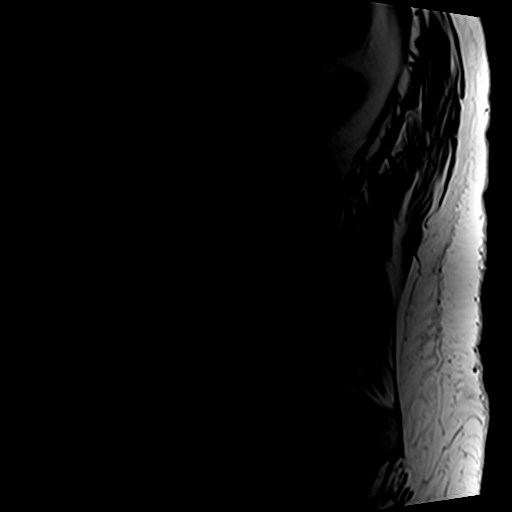
[im 4/15]
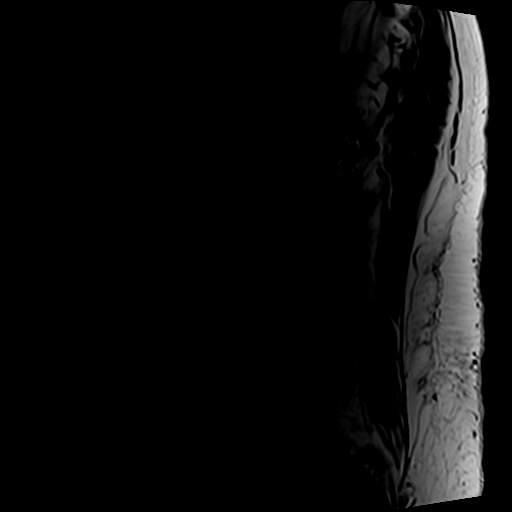
[im 8/15]
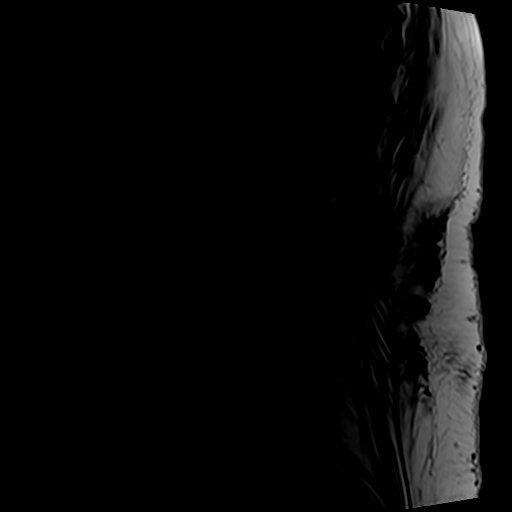
[im 11/15]
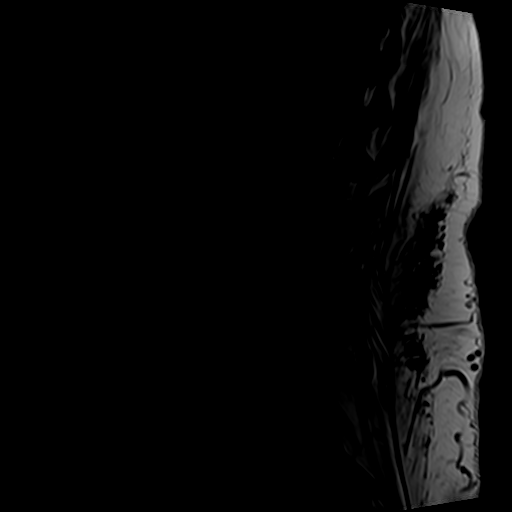
[im 15/15]
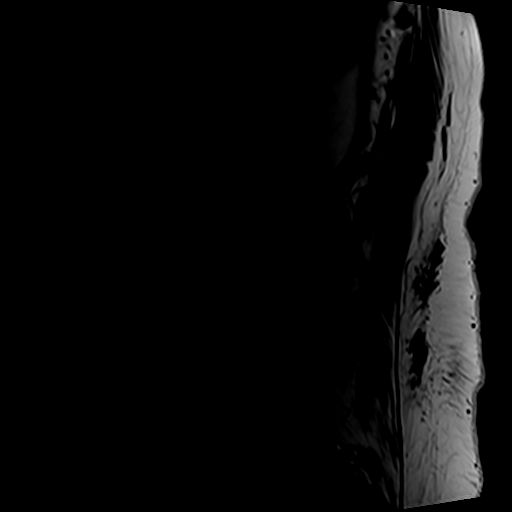

[Series 4: T2 · axial · 4.0mm · 0.82mm/px · z∈[-40,+178]mm · 8 of 34 slices shown (1 of 2)]
[im 1/34]
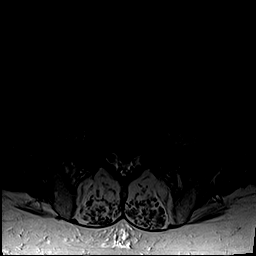
[im 4/34]
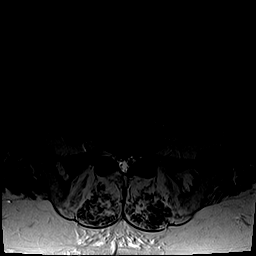
[im 12/34]
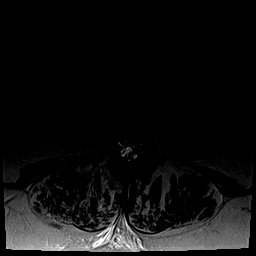
[im 15/34]
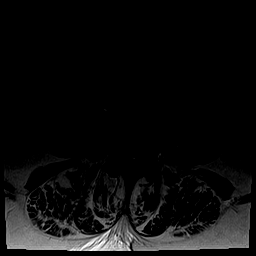
[im 19/34]
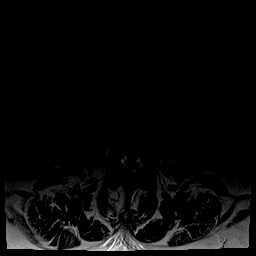
[im 23/34]
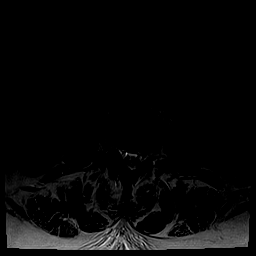
[im 30/34]
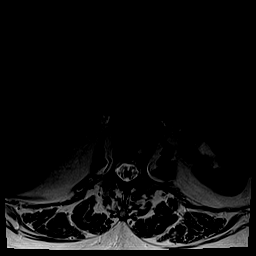
[im 34/34]
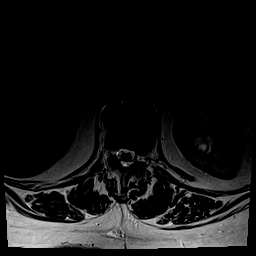

[Series 5: T1 · axial · 4.0mm · 0.41mm/px · z∈[-40,+134]mm · 7 of 34 slices shown (2 of 2)]
[im 1/34]
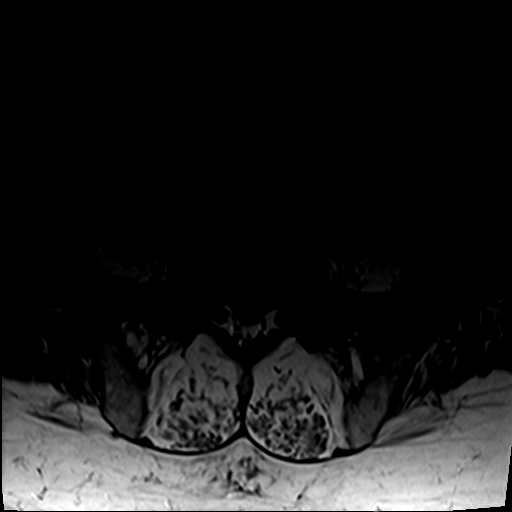
[im 4/34]
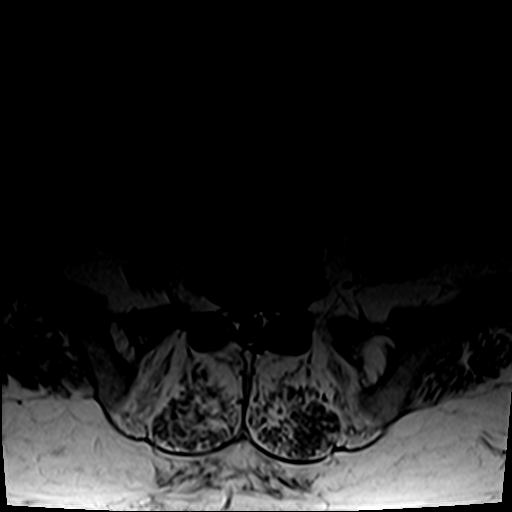
[im 12/34]
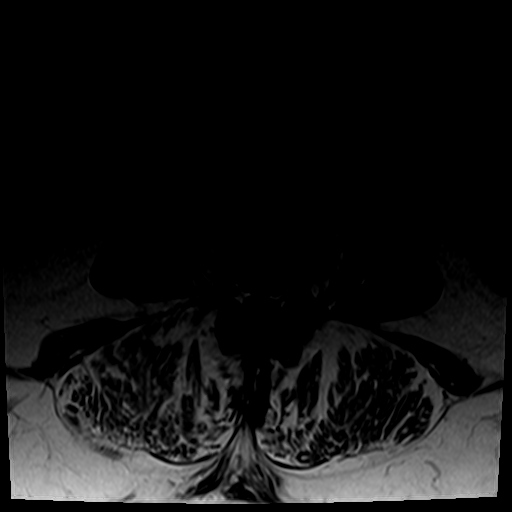
[im 15/34]
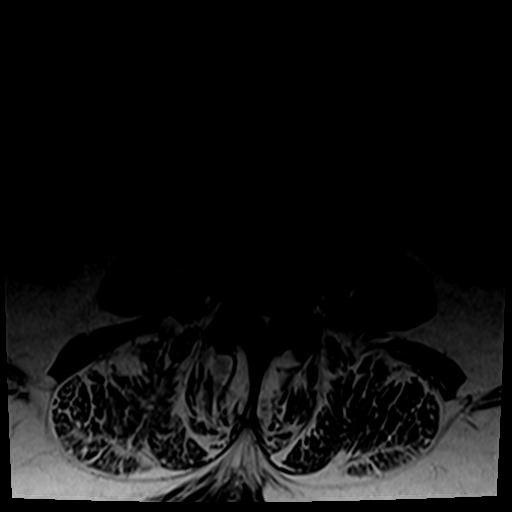
[im 19/34]
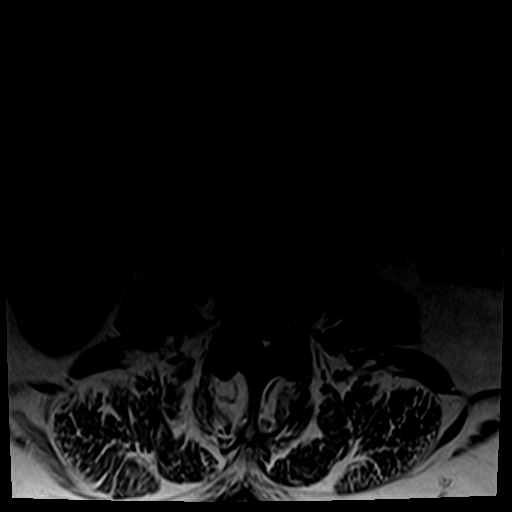
[im 23/34]
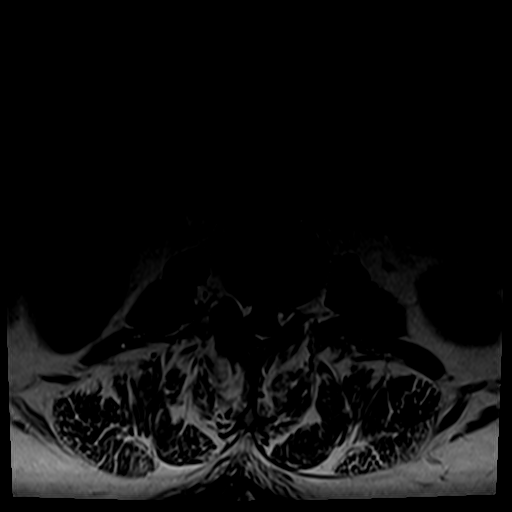
[im 30/34]
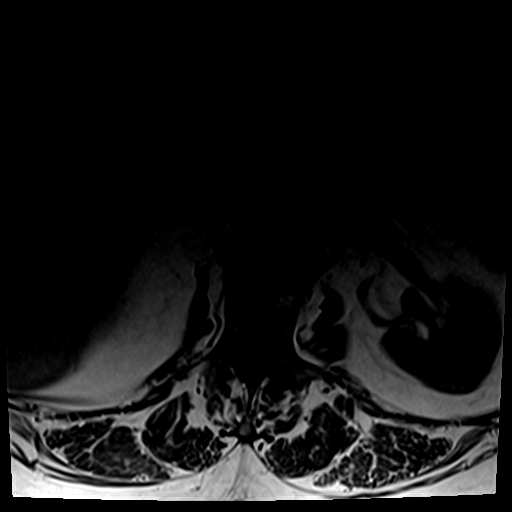

[Series 6: T2 · sagittal · 4.0mm · 0.55mm/px · 4 of 15 slices shown (2 of 2)]
[im 1/15]
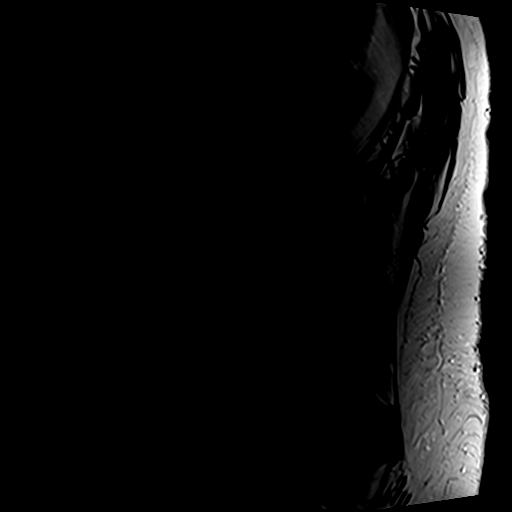
[im 5/15]
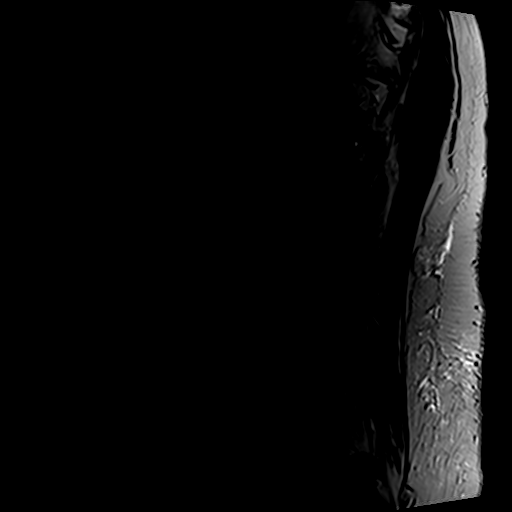
[im 10/15]
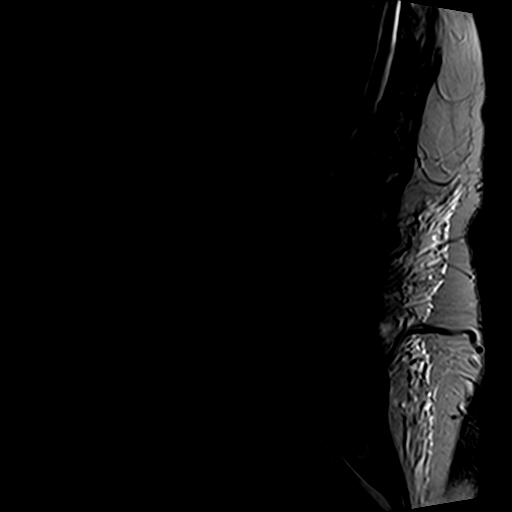
[im 15/15]
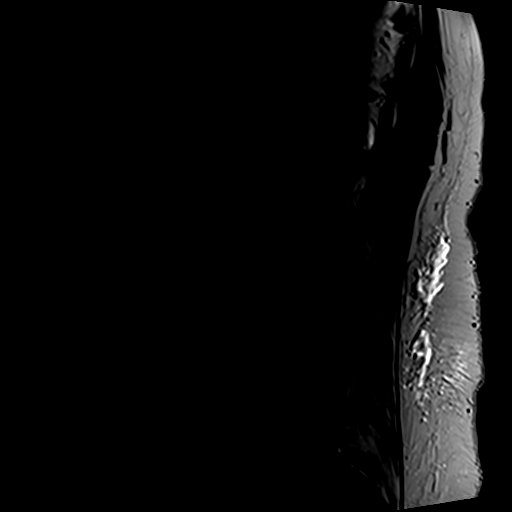

[24 of 48 positions shown; findings below may reference images not displayed]

FINDINGS: Segmentation:  5 lumbar type vertebral bodies.

Alignment:  Normal

Vertebrae: No fracture or primary bone lesion. Insignificant benign
hemangioma within the T11 vertebral body.

Conus medullaris and cauda equina: Conus extends to the L1 level.
Conus and cauda equina appear normal.

Paraspinal and other soft tissues: Negative

Disc levels:

Mild, non-compressive disc bulges at T10-11, T11-12 and T12-L1. Mild
facet osteoarthritis at those levels.

L1-2: Disc degeneration with disc bulging and a central disc
herniation with slight caudal down turning. Facet degeneration and
hypertrophy. Spinal stenosis at this level, effacing the
subarachnoid space and crowding the nerve roots. This could be
significant stenosis.

L2-3: Endplate osteophytes and bulging of the disc. Central disc
herniation with caudal migration in the midline. Mild facet
hypertrophy. Mild multifactorial spinal stenosis with narrowing of
the canal and crowding of the nerve roots.

L3-4: Endplate osteophytes and protrusion of the disc. Pronounced
facet and ligamentous hypertrophy. Severe multifactorial stenosis at
this level likely to cause neural compression.

L4-5: Chronic loss of disc height. Probable fusion between the
vertebral bodies. Wide patency of the canal and foramina.

L5-S1: Chronic loss of disc height. Probable fusion between the
vertebral bodies. Wide patency of the canal and foramina.
IMPRESSION: L3-4: Severe multifactorial spinal stenosis likely to cause neural
compression.

L1-2: Multifactorial spinal stenosis that could also be significant.
At this level there is also contribution from a central disc
herniation with slight caudal migration.

L2-3: Multifactorial spinal stenosis that could also be significant.
There is also a central disc herniation with caudal migration at
this level.

## 2019-10-22 ENCOUNTER — Other Ambulatory Visit: Payer: Self-pay | Admitting: *Deleted

## 2019-10-22 MED ORDER — LETROZOLE 2.5 MG PO TABS: 2.5000 mg | ORAL_TABLET | Freq: Every day | ORAL | 1 refills | Status: DC

## 2019-11-12 ENCOUNTER — Telehealth: Payer: Self-pay | Admitting: *Deleted

## 2019-11-23 ENCOUNTER — Inpatient Hospital Stay: Payer: 59

## 2019-11-23 ENCOUNTER — Telehealth: Payer: Self-pay

## 2019-11-23 NOTE — Telephone Encounter (Signed)
Patient is scheduled for a virtual visit tomorrow but did not come for the scheduled lab appt today.  See if she can come for the labs to be drawn tomorrow morning.

## 2019-11-24 ENCOUNTER — Inpatient Hospital Stay: Payer: 59 | Admitting: Oncology

## 2019-11-24 NOTE — Telephone Encounter (Signed)
Done.. Pt stated that she needed 11/24/19 appt cx due to being exposed to [place] and will contact the office back at a later date to R/S

## 2020-05-21 ENCOUNTER — Encounter (INDEPENDENT_AMBULATORY_CARE_PROVIDER_SITE_OTHER): Payer: Self-pay

## 2020-06-13 ENCOUNTER — Other Ambulatory Visit: Payer: Self-pay | Admitting: Internal Medicine

## 2020-06-13 DIAGNOSIS — Z1231 Encounter for screening mammogram for malignant neoplasm of breast: Secondary | ICD-10-CM

## 2020-06-27 ENCOUNTER — Ambulatory Visit (INDEPENDENT_AMBULATORY_CARE_PROVIDER_SITE_OTHER): Payer: 59 | Admitting: Obstetrics

## 2020-06-27 ENCOUNTER — Encounter: Payer: Self-pay | Admitting: Obstetrics

## 2020-06-27 ENCOUNTER — Other Ambulatory Visit (HOSPITAL_COMMUNITY)
Admission: RE | Admit: 2020-06-27 | Discharge: 2020-06-27 | Disposition: A | Discharge status: Home / Self Care | Payer: 59 | Source: Ambulatory Visit | Attending: Obstetrics | Admitting: Obstetrics

## 2020-06-27 ENCOUNTER — Other Ambulatory Visit: Payer: Self-pay

## 2020-06-27 VITALS — BP 110/80 | Ht 63.0 in | Wt 315.0 lb

## 2020-06-27 DIAGNOSIS — Z01419 Encounter for gynecological examination (general) (routine) without abnormal findings: Secondary | ICD-10-CM | POA: Insufficient documentation

## 2020-06-27 DIAGNOSIS — Z124 Encounter for screening for malignant neoplasm of cervix: Secondary | ICD-10-CM

## 2020-06-27 NOTE — Progress Notes (Addendum)
Gynecology Annual Exam  PCP: Perrin Maltese, MD  Chief Complaint: No chief complaint on file.   History of Present Illness:Patient is a 60 y.o. [id number] presents for annual exam. The patient has no complaints today.  Tracyann works from home. She has two children. Lives alone. She is planning a trip to [place] to have gastric bypass in late May of this year. She did a great deal of research prior to making a decision for the surgery.  LMP: No LMP recorded (lmp unknown). Patient is postmenopausal.   The patient is not sexually active. She denies dyspareunia.  The patient does perform self breast exams.  There is notable family history of breast or ovarian cancer in her family.She has had Breast Ca- (right breast) with no present care involved outside of diagnostic mammograms. The patient wears seatbelts: yes.   The patient has regular exercise: no.  She is morbidly obese and struggles with hip and knee pain. She enjoys gardening and fishing but has not been able to do much with her weight  The patient denies current symptoms of depression.     Review of Systems: Review of Systems  Constitutional: Negative.   HENT: Negative.   Eyes: Negative.   Respiratory: Negative.        Uses a CPAP at night  Cardiovascular:       Has hx of atrial fib and Essential HTN  Gastrointestinal: Negative.   Genitourinary: Negative.   Musculoskeletal: Positive for joint pain.       Hip and knee pain.Marland Kitchen BMI is 55.  Skin:       She has a rash under her right breast  Neurological: Negative.   Endo/Heme/Allergies: Negative.   Psychiatric/Behavioral: Negative.        Documented hx of anxiety.    Past Medical History:  Patient Active Problem List   Diagnosis Date Noted  . Morbid obesity with BMI of 60.0-69.9, adult ([place]) 06/04/2019  . Atrial fibrillation ([place]) 01/18/2018  . Other fatigue 11/25/2017  . Shortness of breath on exertion 11/25/2017  . Essential hypertension 11/25/2017  . S/P lumbar  laminectomy 10/15/2017  . Goals of care, counseling/discussion 08/14/2017  . Malignant neoplasm of upper-outer quadrant of right breast in female, estrogen receptor positive ([place]) 07/27/2017  . UTI (lower urinary tract infection) 05/10/2011  . Sepsis due to Escherichia coli (E. coli) ([place]) 05/10/2011  . Nephrolithiasis 05/07/2011  . Acute renal failure ([place]) 05/07/2011    Past Surgical History:  Past Surgical History:  Procedure Laterality Date  . BREAST BIOPSY Right 07/16/2017   Invasive mammary carcinoma  . BREAST LUMPECTOMY Right 09/01/2017   F/U radation, clear margins  . BREAST LUMPECTOMY WITH RADIOACTIVE SEED AND SENTINEL LYMPH NODE BIOPSY Right 09/01/2017   Procedure: BREAST LUMPECTOMY WITH RADIOACTIVE SEED AND SENTINEL LYMPH NODE BIOPSY;  Surgeon: Alphonsa Overall, MD;  Location: [place];  Service: General;  Laterality: Right;  . CHOLECYSTECTOMY     1992  . DILATION AND CURETTAGE OF UTERUS    . EXTRACORPOREAL SHOCK WAVE LITHOTRIPSY Left 02/13/2017   Procedure: EXTRACORPOREAL SHOCK WAVE LITHOTRIPSY (ESWL);  Surgeon: Royston Cowper, MD;  Location: [facility];  Service: Urology;  Laterality: Left;  . FOOT FRACTURE SURGERY Right 2018   repair of trigonem of ankle. endoscopic fasciotomy  . INDUCED ABORTION    . KNEE SURGERY Right    knee scope for torn meniscus  . LUMBAR LAMINECTOMY/DECOMPRESSION MICRODISCECTOMY Bilateral 10/15/2017   Procedure: Laminectomy and Foraminotomy - Lumbar one-two,  Lumbar two-three, Lumbar three-four  - bilateral;  Surgeon: Eustace Moore, MD;  Location: [place];  Service: Neurosurgery;  Laterality: Bilateral;    Gynecologic History:  No LMP recorded (lmp unknown). Patient is postmenopausal. Last Pap: Results were: NILM no abnormalities  Last mammogram: 2021 Results were: BI-RAD I  Obstetric History: [id number]  Family History:  Family History  Problem Relation Age of Onset  . Heart disease Father        deceased 68; had 75 siblings  . Hypertension Father    . Kidney disease Father   . Colon cancer Paternal Uncle 73       deceased 47s  . Hypertension Paternal Uncle   . Diabetes Maternal Aunt   . Hypertension Maternal Aunt   . Heart disease Mother        deceased at 68; had 7 siblings  . Hypertension Mother   . Hyperlipidemia Mother   . Depression Mother   . Anxiety disorder Mother   . Sleep apnea Mother   . Obesity Mother   . Uterine cancer Sister 23       currently 23; no children  . Breast cancer Cousin 25       currently 69; daughter of unaffected maternal aunt    Social History:  Social History   Socioeconomic History  . Marital status: Single    Spouse name: Not on file  . Number of children: 2  . Years of education: 55  . Highest education level: Not on file  Occupational History  . Occupation: Loss adjuster, chartered  Tobacco Use  . Smoking status: Never Smoker  . Smokeless tobacco: Never Used  Vaping Use  . Vaping Use: Never used  Substance and Sexual Activity  . Alcohol use: Yes    Comment: rare  . Drug use: No  . Sexual activity: Not Currently    Birth control/protection: Post-menopausal  Other Topics Concern  . Not on file  Social History Narrative  . Not on file   Social Determinants of Health   Financial Resource Strain: Not on file  Food Insecurity: Not on file  Transportation Needs: Not on file  Physical Activity: Not on file  Stress: Not on file  Social Connections: Not on file  Intimate Partner Violence: Not on file    Allergies:  No Known Allergies  Medications: Prior to Admission medications   Medication Sig Start Date End Date Taking? Authorizing Provider  albuterol (VENTOLIN HFA) 108 (90 Base) MCG/ACT inhaler  06/03/18   [provider]  amiodarone (PACERONE) 200 MG tablet Take 200 mg by mouth 2 (two) times a week. Sunday and Wednesday    Dionisio David, MD  Cholecalciferol (VITAMIN D3) 50000 units CAPS Take 50,000 Units by mouth every Sunday.  06/27/17   [provider]   citalopram (CELEXA) 40 MG tablet Take 40 mg by mouth daily. 07/02/17   [provider]  fluticasone (FLONASE) 50 MCG/ACT nasal spray Place 1 spray into both nostrils as needed. 12/12/17   [provider]  furosemide (LASIX) 20 MG tablet Take 20 mg by mouth daily. 08/19/17   [provider]  hydrochlorothiazide (HYDRODIURIL) 12.5 MG tablet Take 12.5 mg by mouth daily. 05/04/19   [provider]  letrozole (FEMARA) 2.5 MG tablet Take 1 tablet (2.5 mg total) by mouth daily. 10/22/19   Earlie Server, MD  lisinopril (ZESTRIL) 10 MG tablet Take 10 mg by mouth daily. 06/03/19   [provider]  meloxicam (MOBIC) 15 MG  tablet Take 15 mg by mouth daily.    [provider]  XARELTO 20 MG TABS tablet TK 1 T PO QD 10/21/17   [provider]    Physical Exam Vitals: There were no vitals taken for this visit.  General: NAD HEENT: normocephalic, anicteric Thyroid: no enlargement, no palpable nodules Pulmonary: No increased work of breathing, CTAB Cardiovascular: RRR, distal pulses 2+ Breast: Breast symmetrical, no tenderness, no palpable nodules or masses, no skin or nipple retraction present, no nipple discharge.  No axillary or supraclavicular lymphadenopathy.Rash noted under right breast- likely yeast/fungal.Abdomen: NABS, soft, non-tender, non-distended.  Umbilicus without lesions.  No hepatomegaly, splenomegaly or masses palpable. No evidence of hernia  Genitourinary: Difficulty examining due to BMI of 55.  External: Normal external female genitalia.  Normal urethral meatus, normal Bartholin's and Skene's glands.    Vagina: Normal vaginal mucosa, no evidence of prolapse.    Cervix: Grossly normal in appearance, no bleeding  Uterus: Non-enlarged, mobile, normal contour.  No CMT  Adnexa: ovaries non-enlarged, no adnexal masses  Rectal: deferred  Lymphatic: no evidence of inguinal lymphadenopathy Extremities: no edema, erythema, or  tenderness Neurologic: Grossly intact Psychiatric: mood appropriate, affect full  Female chaperone present for pelvic and breast  portions of the physical exam     Assessment: 60 y.o. [id number] routine annual exam Morbid obesity- scheduled for Gastric bypass in [place] next month.  Plan: Problem List Items Addressed This Visit   None   Visit Diagnoses    Women's annual routine gynecological examination    -  Primary   Cervical cancer screening          1) Mammogram - recommend yearly screening mammogram.  Mammogram has been ordered by her PCP and is scheduled  2) STI screening  was notoffered and therefore not obtained  3) ASCCP guidelines and rational discussed.  Patient opts for yearly screening interval  4) Osteoporosis  - per USPTF routine screening DEXA at age 64   5) Routine healthcare maintenance including cholesterol, diabetes screening discussed managed by PCP  6) Colonoscopy managed by her PCP.  Screening recommended starting at age 6 for average risk individuals, age 9 for individuals deemed at increased risk (including African Americans) and recommended to continue until age 54.  For patient age 73-85 individualized approach is recommended.  Gold standard screening is via colonoscopy, Cologuard screening is an acceptable alternative for patient unwilling or unable to undergo colonoscopy.  "Colorectal cancer screening for average?risk adults: 2018 guideline update from the American Cancer Society"CA: A Cancer Journal for Clinicians: Aug 14, 2016   7) she will return in one year or her next Annual. Plans on lettign [place] know how her Gastric bypass surgery goes.    Imagene Riches, CNM  06/27/2020 8:35 AM   Mosetta Pigeon, [facility] 06/27/2020, 8:34 AM

## 2020-06-29 LAB — CYTOLOGY - PAP
Comment: NEGATIVE
Diagnosis: NEGATIVE
High risk HPV: NEGATIVE

## 2020-07-11 ENCOUNTER — Ambulatory Visit
Admission: RE | Admit: 2020-07-11 | Discharge: 2020-07-11 | Disposition: A | Discharge status: Home / Self Care | Payer: 59 | Source: Ambulatory Visit | Attending: Internal Medicine | Admitting: Internal Medicine

## 2020-07-11 ENCOUNTER — Other Ambulatory Visit: Payer: Self-pay

## 2020-07-11 DIAGNOSIS — Z1231 Encounter for screening mammogram for malignant neoplasm of breast: Secondary | ICD-10-CM | POA: Insufficient documentation

## 2020-07-18 ENCOUNTER — Other Ambulatory Visit: Payer: Self-pay | Admitting: Cardiovascular Disease

## 2020-07-18 ENCOUNTER — Ambulatory Visit: Payer: Self-pay | Admitting: Cardiovascular Disease

## 2020-07-27 ENCOUNTER — Ambulatory Visit: Payer: 59 | Admitting: Certified Registered Nurse Anesthetist

## 2020-07-27 ENCOUNTER — Encounter
Admission: RE | Disposition: A | Discharge status: Home / Self Care | Payer: Self-pay | Source: Home / Self Care | Attending: Cardiovascular Disease

## 2020-07-27 ENCOUNTER — Ambulatory Visit
Admission: RE | Admit: 2020-07-27 | Discharge: 2020-07-27 | Disposition: A | Discharge status: Home / Self Care | Payer: 59 | Attending: Cardiovascular Disease | Admitting: Cardiovascular Disease

## 2020-07-27 ENCOUNTER — Other Ambulatory Visit: Payer: Self-pay

## 2020-07-27 ENCOUNTER — Encounter: Payer: Self-pay | Admitting: Cardiovascular Disease

## 2020-07-27 ENCOUNTER — Ambulatory Visit
Admission: RE | Admit: 2020-07-27 | Discharge: 2020-07-27 | Disposition: A | Discharge status: Home / Self Care | Payer: 59 | Source: Home / Self Care | Attending: Cardiovascular Disease | Admitting: Cardiovascular Disease

## 2020-07-27 DIAGNOSIS — Z6841 Body Mass Index (BMI) 40.0 and over, adult: Secondary | ICD-10-CM | POA: Diagnosis not present

## 2020-07-27 DIAGNOSIS — E669 Obesity, unspecified: Secondary | ICD-10-CM | POA: Insufficient documentation

## 2020-07-27 DIAGNOSIS — Z79899 Other long term (current) drug therapy: Secondary | ICD-10-CM | POA: Insufficient documentation

## 2020-07-27 DIAGNOSIS — I10 Essential (primary) hypertension: Secondary | ICD-10-CM | POA: Insufficient documentation

## 2020-07-27 DIAGNOSIS — I4891 Unspecified atrial fibrillation: Secondary | ICD-10-CM | POA: Insufficient documentation

## 2020-07-27 DIAGNOSIS — Z87891 Personal history of nicotine dependence: Secondary | ICD-10-CM | POA: Diagnosis not present

## 2020-07-27 DIAGNOSIS — E785 Hyperlipidemia, unspecified: Secondary | ICD-10-CM | POA: Insufficient documentation

## 2020-07-27 DIAGNOSIS — Z7901 Long term (current) use of anticoagulants: Secondary | ICD-10-CM | POA: Diagnosis not present

## 2020-07-27 DIAGNOSIS — I340 Nonrheumatic mitral (valve) insufficiency: Secondary | ICD-10-CM | POA: Insufficient documentation

## 2020-07-27 DIAGNOSIS — Z8249 Family history of ischemic heart disease and other diseases of the circulatory system: Secondary | ICD-10-CM | POA: Insufficient documentation

## 2020-07-27 DIAGNOSIS — Z853 Personal history of malignant neoplasm of breast: Secondary | ICD-10-CM | POA: Insufficient documentation

## 2020-07-27 HISTORY — PX: CARDIOVERSION: SHX1299

## 2020-07-27 SURGERY — CARDIOVERSION
Anesthesia: General | Laterality: Right

## 2020-07-27 MED ORDER — SODIUM CHLORIDE 0.9 % IV SOLN: INTRAVENOUS | Status: DC

## 2020-07-27 MED ORDER — MIDAZOLAM HCL 2 MG/2ML IJ SOLN
INTRAMUSCULAR | Status: DC | PRN
  Administered 2020-07-27: 1 mg via INTRAVENOUS

## 2020-07-27 MED ORDER — SODIUM CHLORIDE FLUSH 0.9 % IV SOLN
INTRAVENOUS | Status: AC
  Filled 2020-07-27: qty 10

## 2020-07-27 MED ORDER — PROPOFOL 10 MG/ML IV BOLUS
INTRAVENOUS | Status: AC
  Filled 2020-07-27: qty 20

## 2020-07-27 MED ORDER — RIVAROXABAN 20 MG PO TABS
20.0000 mg | ORAL_TABLET | Freq: Once | ORAL | Status: DC
  Filled 2020-07-27: qty 1

## 2020-07-27 MED ORDER — HEPARIN SODIUM (PORCINE) 5000 UNIT/ML IJ SOLN
5000.0000 [IU] | Freq: Once | INTRAMUSCULAR | Status: AC
  Administered 2020-07-27: 5000 [IU] via INTRAVENOUS

## 2020-07-27 MED ORDER — HEPARIN SODIUM (PORCINE) 1000 UNIT/ML IJ SOLN
INTRAMUSCULAR | Status: AC
  Filled 2020-07-27: qty 1

## 2020-07-27 MED ORDER — PROPOFOL 10 MG/ML IV BOLUS
INTRAVENOUS | Status: DC | PRN
  Administered 2020-07-27: 30 mg via INTRAVENOUS
  Administered 2020-07-27: 10 mg via INTRAVENOUS
  Administered 2020-07-27: 30 mg via INTRAVENOUS
  Administered 2020-07-27: 20 mg via INTRAVENOUS
  Administered 2020-07-27: 10 mg via INTRAVENOUS
  Administered 2020-07-27: 20 mg via INTRAVENOUS

## 2020-07-27 MED ORDER — MIDAZOLAM HCL 2 MG/2ML IJ SOLN
INTRAMUSCULAR | Status: AC
  Filled 2020-07-27: qty 2

## 2020-07-27 MED ORDER — LIDOCAINE VISCOUS HCL 2 % MT SOLN
OROMUCOSAL | Status: AC
  Filled 2020-07-27: qty 15

## 2020-07-27 MED ORDER — BUTAMBEN-TETRACAINE-BENZOCAINE 2-2-14 % EX AERO
INHALATION_SPRAY | CUTANEOUS | Status: AC
  Filled 2020-07-27: qty 5

## 2020-07-27 NOTE — Anesthesia Procedure Notes (Signed)
Date/Time: 07/27/2020 7:47 AM Performed by: Johnna Acosta, CRNA Pre-anesthesia Checklist: Patient identified, Emergency Drugs available, Suction available, Patient being monitored and Timeout performed Patient Re-evaluated:Patient Re-evaluated prior to induction Oxygen Delivery Method: Nasal cannula Preoxygenation: Pre-oxygenation with 100% oxygen Induction Type: IV induction

## 2020-07-27 NOTE — Transfer of Care (Signed)
Immediate Anesthesia Transfer of Care Note  Patient: Heather Conrad  Procedure(s) Performed: CARDIOVERSION (Right )  Patient Location: PACU  Anesthesia Type:General  Level of Consciousness: awake and drowsy  Airway & Oxygen Therapy: Patient Spontanous Breathing and Patient connected to nasal cannula oxygen  Post-op Assessment: Report given to RN and Post -op Vital signs reviewed and stable  Post vital signs: Reviewed and stable  Last Vitals:  Vitals Value Taken Time  BP 115/68 07/27/20 0818  Temp    Pulse 56 07/27/20 0818  Resp 26 07/27/20 0818  SpO2 97 % 07/27/20 0818  Vitals shown include unvalidated device data.  Last Pain:  Vitals:   07/27/20 0711  TempSrc: Oral  PainSc: 0-No pain         Complications: No complications documented.

## 2020-07-27 NOTE — Anesthesia Postprocedure Evaluation (Signed)
Anesthesia Post Note  Patient: Heather Conrad  Procedure(s) Performed: CARDIOVERSION (Right )  Patient location during evaluation: Specials Recovery Anesthesia Type: General Level of consciousness: awake and alert Pain management: pain level controlled Vital Signs Assessment: post-procedure vital signs reviewed and stable Respiratory status: spontaneous breathing, nonlabored ventilation, respiratory function stable and patient connected to nasal cannula oxygen Cardiovascular status: blood pressure returned to baseline and stable Postop Assessment: no apparent nausea or vomiting Anesthetic complications: no   No complications documented.   Last Vitals:  Vitals:   07/27/20 0818 07/27/20 0825  BP: 115/68 114/66  Pulse: (!) 57 (!) 54  Resp: (!) 29 (!) 23  Temp:    SpO2: 97% 96%    Last Pain:  Vitals:   07/27/20 0825  TempSrc:   PainSc: 0-No pain                 Martha Clan

## 2020-07-27 NOTE — CV Procedure (Signed)
  NAME:  Heather Conrad   MRN: [id number] DOB:  Jul 27, 1960   ADMIT DATE: 07/27/2020  Procedure: Electrical Cardioversion Indications:  Atrial Fibrillation  Procedure Details:    Time Out: Verified patient identification, verified procedure, site/side was marked, verified correct patient position, special equipment/implants available, medications/allergies/relevent history reviewed, required imaging and test results available.    Patient placed on cardiac monitor, pulse oximetry, supplemental oxygen as necessary.  Sedation given:  Pacer pads placed   Cardioverted . To nsr Cardioverted at 200 j  Evaluation: Findings: Post procedure EKG shows: nsr Complications: none Patient did well.     Dionisio David, M.D. [facility]   07/27/2020 8:54 AM

## 2020-07-27 NOTE — Progress Notes (Signed)
Patient here for cardioversion this am upon review of home med list she told me her last dose of xarelto was on Monday 5/9 and when questioned who instructed her to hold this med she said dr. Laurelyn Sickle office staff upon her checkout at last visit.  I informed Dr. Humphrey Rolls of the above and he  Ordered heparin 5000u iv heparin bolus prior to procedure.

## 2020-07-27 NOTE — Progress Notes (Signed)
*  PRELIMINARY RESULTS* Echocardiogram Echocardiogram Transesophageal has been performed.  Sherrie Sport 07/27/2020, 8:25 AM

## 2020-07-27 NOTE — Anesthesia Preprocedure Evaluation (Signed)
Anesthesia Evaluation  Patient identified by MRN, date of birth, ID band Patient awake    Reviewed: Allergy & Precautions, H&P , NPO status , Patient's Chart, lab work & pertinent test results, reviewed documented beta blocker date and time   History of Anesthesia Complications Negative for: history of anesthetic complications  Airway Mallampati: IV  TM Distance: >3 FB Neck ROM: full    Dental  (+) Upper Dentures, Lower Dentures, Dental Advidsory Given   Pulmonary shortness of breath (with afib), neg COPD, neg recent URI,    Pulmonary exam normal breath sounds clear to auscultation       Cardiovascular Exercise Tolerance: Good hypertension, (-) angina(-) Past MI and (-) Cardiac Stents + dysrhythmias Atrial Fibrillation + Valvular Problems/Murmurs  Rhythm:regular Rate:Normal     Neuro/Psych PSYCHIATRIC DISORDERS Anxiety negative neurological ROS     GI/Hepatic negative GI ROS, Neg liver ROS,   Endo/Other  diabetes (borderline)Morbid obesity  Renal/GU Renal disease (kidney stones)  negative genitourinary   Musculoskeletal   Abdominal   Peds  Hematology negative hematology ROS (+)   Anesthesia Other Findings Past Medical History: 01/2012: Acute renal failure (ARF) (HCC) No date: Anxiety 2003: Arthritis     Comment:  SPINE AND RIGHT KNEE No date: Breast cancer ([place]) 07/16/2017: Breast cancer in female [facility])     Comment:  Right-invasive mammary cancer No date: Cancer ([place]) No date: Cough No date: Dry skin No date: Dyspnea 01/2017: Dysrhythmia     Comment:  atrial fibrillation No date: Fatigue No date: Heart murmur     Comment:  per pt report No date: History of kidney stones 2013: History of MRSA infection     Comment:  leg, abdomen, arm 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 2018: Hyperlipidemia No date: Hypertension 01/2017: Kidney stone No date: Leg cramping No date: Leg pain No date: Lumbar stenosis No date:  Nervousness 01/2017: Obesity No date: Palpitations No date: Personal history of radiation therapy No date: Pyelonephritis No date: Shortness of breath on exertion No date: Stress No date: Swelling of both lower extremities No date: UTI (urinary tract infection) No date: Vitamin D deficiency No date: Weakness   Reproductive/Obstetrics negative OB ROS                             Anesthesia Physical Anesthesia Plan  ASA: III  Anesthesia Plan: General   Post-op Pain Management:    Induction: Intravenous  PONV Risk Score and Plan: 3 and TIVA  Airway Management Planned: Natural Airway and Nasal Cannula  Additional Equipment:   Intra-op Plan:   Post-operative Plan:   Informed Consent: I have reviewed the patients History and Physical, chart, labs and discussed the procedure including the risks, benefits and alternatives for the proposed anesthesia with the patient or authorized representative who has indicated his/her understanding and acceptance.     Dental Advisory Given  Plan Discussed with: Anesthesiologist, CRNA and Surgeon  Anesthesia Plan Comments:         Anesthesia Quick Evaluation

## 2020-11-17 ENCOUNTER — Other Ambulatory Visit: Payer: Self-pay | Admitting: *Deleted

## 2020-11-17 MED ORDER — LETROZOLE 2.5 MG PO TABS: 2.5000 mg | ORAL_TABLET | Freq: Every day | ORAL | 0 refills | Status: DC

## 2020-11-17 NOTE — Telephone Encounter (Signed)
Day asking how to get her Letrozole refilled. I explained that she needs to see doctor and that if she scheduled follow up appointment, I could send in enough medicine to get to that appointment. She is scheduled for 9/27, I will send 30 day supply to [facility] in [place] per patient request

## 2020-12-12 ENCOUNTER — Encounter: Payer: Self-pay | Admitting: Oncology

## 2020-12-12 ENCOUNTER — Inpatient Hospital Stay (HOSPITAL_BASED_OUTPATIENT_CLINIC_OR_DEPARTMENT_OTHER): Payer: 59 | Admitting: Oncology

## 2020-12-12 ENCOUNTER — Inpatient Hospital Stay: Payer: 59 | Attending: Oncology

## 2020-12-12 ENCOUNTER — Other Ambulatory Visit: Payer: Self-pay

## 2020-12-12 VITALS — BP 143/70 | HR 49 | Temp 98.0°F | Resp 19 | Wt 260.7 lb

## 2020-12-12 DIAGNOSIS — Z841 Family history of disorders of kidney and ureter: Secondary | ICD-10-CM | POA: Diagnosis not present

## 2020-12-12 DIAGNOSIS — Z836 Family history of other diseases of the respiratory system: Secondary | ICD-10-CM | POA: Diagnosis not present

## 2020-12-12 DIAGNOSIS — Z8049 Family history of malignant neoplasm of other genital organs: Secondary | ICD-10-CM | POA: Diagnosis not present

## 2020-12-12 DIAGNOSIS — N1831 Chronic kidney disease, stage 3a: Secondary | ICD-10-CM | POA: Insufficient documentation

## 2020-12-12 DIAGNOSIS — Z923 Personal history of irradiation: Secondary | ICD-10-CM | POA: Diagnosis not present

## 2020-12-12 DIAGNOSIS — Z170 Estrogen receptor positive status [ER+]: Secondary | ICD-10-CM

## 2020-12-12 DIAGNOSIS — Z87440 Personal history of urinary (tract) infections: Secondary | ICD-10-CM | POA: Insufficient documentation

## 2020-12-12 DIAGNOSIS — C50411 Malignant neoplasm of upper-outer quadrant of right female breast: Secondary | ICD-10-CM

## 2020-12-12 DIAGNOSIS — Z803 Family history of malignant neoplasm of breast: Secondary | ICD-10-CM | POA: Diagnosis not present

## 2020-12-12 DIAGNOSIS — Z800 Family history of malignant neoplasm of digestive organs: Secondary | ICD-10-CM | POA: Insufficient documentation

## 2020-12-12 DIAGNOSIS — Z833 Family history of diabetes mellitus: Secondary | ICD-10-CM | POA: Diagnosis not present

## 2020-12-12 DIAGNOSIS — Z7901 Long term (current) use of anticoagulants: Secondary | ICD-10-CM | POA: Insufficient documentation

## 2020-12-12 DIAGNOSIS — Z8614 Personal history of Methicillin resistant Staphylococcus aureus infection: Secondary | ICD-10-CM | POA: Insufficient documentation

## 2020-12-12 DIAGNOSIS — M2550 Pain in unspecified joint: Secondary | ICD-10-CM | POA: Insufficient documentation

## 2020-12-12 DIAGNOSIS — Z8349 Family history of other endocrine, nutritional and metabolic diseases: Secondary | ICD-10-CM | POA: Insufficient documentation

## 2020-12-12 DIAGNOSIS — Z8249 Family history of ischemic heart disease and other diseases of the circulatory system: Secondary | ICD-10-CM | POA: Insufficient documentation

## 2020-12-12 DIAGNOSIS — Z79811 Long term (current) use of aromatase inhibitors: Secondary | ICD-10-CM | POA: Diagnosis not present

## 2020-12-12 DIAGNOSIS — Z9049 Acquired absence of other specified parts of digestive tract: Secondary | ICD-10-CM | POA: Insufficient documentation

## 2020-12-12 DIAGNOSIS — Z818 Family history of other mental and behavioral disorders: Secondary | ICD-10-CM | POA: Diagnosis not present

## 2020-12-12 DIAGNOSIS — Z87442 Personal history of urinary calculi: Secondary | ICD-10-CM | POA: Diagnosis not present

## 2020-12-12 LAB — COMPREHENSIVE METABOLIC PANEL
ALT: 25 U/L (ref 0–44)
AST: 23 U/L (ref 15–41)
Albumin: 4 g/dL (ref 3.5–5.0)
Alkaline Phosphatase: 81 U/L (ref 38–126)
Anion gap: 9 (ref 5–15)
BUN: 22 mg/dL — ABNORMAL HIGH (ref 6–20)
CO2: 25 mmol/L (ref 22–32)
Calcium: 9.4 mg/dL (ref 8.9–10.3)
Chloride: 105 mmol/L (ref 98–111)
Creatinine, Ser: 1.18 mg/dL — ABNORMAL HIGH (ref 0.44–1.00)
GFR, Estimated: 53 mL/min — ABNORMAL LOW (ref >60)
Glucose, Bld: 111 mg/dL — ABNORMAL HIGH (ref 70–99)
Potassium: 4.4 mmol/L (ref 3.5–5.1)
Sodium: 139 mmol/L (ref 135–145)
Total Bilirubin: 0.9 mg/dL (ref 0.3–1.2)
Total Protein: 7 g/dL (ref 6.5–8.1)

## 2020-12-12 LAB — CBC WITH DIFFERENTIAL/PLATELET
Abs Immature Granulocytes: 0.02 10*3/uL (ref 0.00–0.07)
Basophils Absolute: 0 10*3/uL (ref 0.0–0.1)
Basophils Relative: 0 %
Eosinophils Absolute: 0.2 10*3/uL (ref 0.0–0.5)
Eosinophils Relative: 3 %
HCT: 42.2 % (ref 36.0–46.0)
Hemoglobin: 14.2 g/dL (ref 12.0–15.0)
Immature Granulocytes: 0 %
Lymphocytes Relative: 30 %
Lymphs Abs: 2 10*3/uL (ref 0.7–4.0)
MCH: 31.7 pg (ref 26.0–34.0)
MCHC: 33.6 g/dL (ref 30.0–36.0)
MCV: 94.2 fL (ref 80.0–100.0)
Monocytes Absolute: 0.4 10*3/uL (ref 0.1–1.0)
Monocytes Relative: 5 %
Neutro Abs: 4.2 10*3/uL (ref 1.7–7.7)
Neutrophils Relative %: 62 %
Platelets: 215 10*3/uL (ref 150–400)
RBC: 4.48 MIL/uL (ref 3.87–5.11)
RDW: 13.2 % (ref 11.5–15.5)
WBC: 6.7 10*3/uL (ref 4.0–10.5)
nRBC: 0 % (ref 0.0–0.2)

## 2020-12-12 MED ORDER — LETROZOLE 2.5 MG PO TABS: 2.5000 mg | ORAL_TABLET | Freq: Every day | ORAL | 5 refills | Status: DC

## 2020-12-12 NOTE — Progress Notes (Signed)
Hematology/Oncology follow up note [facility] [phone] [phone]   Patient Care Team: Perrin Maltese, MD as PCP - General (Internal Medicine) Dionisio David, MD as Consulting Physician (Cardiology) Earlie Server, MD as Consulting Physician (Oncology) Elsie Saas, MD as Consulting Physician (Orthopedic Surgery)  REFERRING PROVIDER: Perrin Maltese, MD  REASON FOR VISIT Follow up for treatment of of breast cancer  HISTORY OF PRESENTING ILLNESS:  Heather Conrad is a  60 y.o.  female with PMH listed below who was referred to me for evaluation of breast cancer.  Recent screening Mammogram showed 1. Complex mass in the right breast, predominantly cystic, with abnormal tissue along its anterior wall. Biopsy is recommended. 2. Benign left breast calcifications.Sonographic evaluation of the right axilla shows no enlarged or abnormal lymph nodes. 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   # 09/01/2017 [name].  she is s/p right lumpectomy and right sentinel lymph node biopsy. 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  #Oncotype Dx recurrence score 13, absoluate chemotherapy risk<1%. Discussed with patient that adjuvant chemotherapy will not be offered.  #  finished adjuvant radiation 11/ 03/2017.  INTERVAL HISTORY Heather Conrad is a 60 y.o. female who has above history reviewed by me today presents for follow-up visit for management of breast cancer. pT1cN0 She tolerates Letrozole 2.5 mg daily.  Patient last follow-up since March 20 21st.  Presents to reestablish care Patient has had bilateral screening mammogram done in April 2022. Patient has intentionally lost 50 pounds. Review of Systems  Constitutional:  Negative for appetite change, chills, fatigue and fever.  HENT:   Negative  for hearing loss and voice change.   Eyes:  Negative for eye problems.  Respiratory:  Negative for chest tightness and cough.   Cardiovascular:  Negative for chest pain.  Gastrointestinal:  Negative for abdominal distention, abdominal pain and blood in stool.  Endocrine: Positive for hot flashes.  Genitourinary:  Negative for difficulty urinating and frequency.   Musculoskeletal:  Positive for arthralgias.  Skin:  Negative for itching and rash.  Neurological:  Negative for extremity weakness.  Hematological:  Negative for adenopathy.  Psychiatric/Behavioral:  Negative for confusion.     MEDICAL HISTORY:  Past Medical History:  Diagnosis Date   Acute renal failure (ARF) ([place]) 01/2012   Anxiety    Arthritis 2003   SPINE AND RIGHT KNEE   Breast cancer ([place])    Breast cancer in female ([place]) 07/16/2017   Right-invasive mammary cancer   Cancer ([place])    Cough    Dry skin    Dyspnea    Dysrhythmia 01/2017   atrial fibrillation   Fatigue    Heart murmur    per pt report   History of kidney stones    History of MRSA infection 2013   leg, abdomen, arm   HPV in female    Hyperlipidemia 2018   Hypertension    Kidney stone 01/2017   Leg cramping    Leg pain    Lumbar stenosis    Nervousness    Obesity 01/2017   Palpitations    Personal history of radiation therapy    Pyelonephritis    Shortness of breath on exertion    Stress    Swelling of both lower extremities    UTI (urinary tract infection)    Vitamin D  deficiency    Weakness     SURGICAL HISTORY: Past Surgical History:  Procedure Laterality Date   BREAST BIOPSY Right 07/16/2017   Invasive mammary carcinoma   BREAST LUMPECTOMY Right 09/01/2017   F/U radation, clear margins   BREAST LUMPECTOMY WITH RADIOACTIVE SEED AND SENTINEL LYMPH NODE BIOPSY Right 09/01/2017   Procedure: BREAST LUMPECTOMY WITH RADIOACTIVE SEED AND SENTINEL LYMPH NODE BIOPSY;  Surgeon: Alphonsa Overall, MD;  Location: [place];  Service: General;   Laterality: Right;   CARDIOVERSION Right 07/27/2020   Procedure: CARDIOVERSION;  Surgeon: Dionisio David, MD;  Location: [facility];  Service: Cardiovascular;  Laterality: Right;   [place] LITHOTRIPSY Left 02/13/2017   Procedure: EXTRACORPOREAL SHOCK WAVE LITHOTRIPSY (ESWL);  Surgeon: Royston Cowper, MD;  Location: [facility];  Service: Urology;  Laterality: Left;   FOOT FRACTURE SURGERY Right 2018   repair of trigonem of ankle. endoscopic fasciotomy   INDUCED ABORTION     KNEE SURGERY Right    knee scope for torn meniscus   LUMBAR LAMINECTOMY/DECOMPRESSION MICRODISCECTOMY Bilateral 10/15/2017   Procedure: Laminectomy and Foraminotomy - Lumbar one-two, Lumbar two-three, Lumbar three-four  - bilateral;  Surgeon: Eustace Moore, MD;  Location: [place];  Service: Neurosurgery;  Laterality: Bilateral;    SOCIAL HISTORY: Social History   Socioeconomic History   Marital status: Single    Spouse name: Not on file   Number of children: 2   Years of education: 12   Highest education level: Not on file  Occupational History   Occupation: Loss adjuster, chartered  Tobacco Use   Smoking status: Never   Smokeless tobacco: Never  Vaping Use   Vaping Use: Never used  Substance and Sexual Activity   Alcohol use: Yes    Comment: rare   Drug use: No   Sexual activity: Not Currently    Birth control/protection: Post-menopausal  Other Topics Concern   Not on file  Social History Narrative   Not on file   Social Determinants of Health   Financial Resource Strain: Not on file  Food Insecurity: Not on file  Transportation Needs: Not on file  Physical Activity: Not on file  Stress: Not on file  Social Connections: Not on file  Intimate Partner Violence: Not on file    FAMILY HISTORY: Family History  Problem Relation Age of Onset   Heart disease Father        deceased 20; had 65 siblings   Hypertension Father     Kidney disease Father    Colon cancer Paternal Uncle 46       deceased 84s   Hypertension Paternal Uncle    Diabetes Maternal Aunt    Hypertension Maternal Aunt    Heart disease Mother        deceased at 31; had 7 siblings   Hypertension Mother    Hyperlipidemia Mother    Depression Mother    Anxiety disorder Mother    Sleep apnea Mother    Obesity Mother    Uterine cancer Sister 35       currently 23; no children   Breast cancer Cousin 27       currently 42; daughter of unaffected maternal aunt    ALLERGIES:  has No Known Allergies.  MEDICATIONS:  Current Outpatient Medications  Medication Sig Dispense Refill   amiodarone (PACERONE) 200 MG tablet Take 800 mg by mouth in the  morning.     Biotin 10000 MCG TABS Take 10,000 mcg by mouth in the morning.     lisinopril (ZESTRIL) 10 MG tablet Take 10 mg by mouth in the morning.     Multiple Vitamin (MULTIVITAMIN WITH MINERALS) TABS tablet Take 1 tablet by mouth daily. Centrum Silver for Women     XARELTO 20 MG TABS tablet Take 20 mg by mouth in the morning.  2   letrozole (FEMARA) 2.5 MG tablet Take 1 tablet (2.5 mg total) by mouth daily. 30 tablet 5   metoprolol succinate (TOPROL-XL) 25 MG 24 hr tablet Take 12.5 mg by mouth in the morning. (Patient not taking: Reported on 12/12/2020)     No current facility-administered medications for this visit.     PHYSICAL EXAMINATION: ECOG PERFORMANCE STATUS: 1 - Symptomatic but completely ambulatory Vitals:   12/12/20 1011  BP: (!) 143/70  Pulse: (!) 49  Resp: 19  Temp: 98 F (36.7 C)  SpO2: 98%   Filed Weights   12/12/20 1011  Weight: 260 lb 11.2 oz (118.3 kg)    Physical Exam Constitutional:      General: She is not in acute distress.    Appearance: She is obese.  HENT:     Head: Normocephalic and atraumatic.  Eyes:     General: No scleral icterus.    Conjunctiva/sclera: Conjunctivae normal.     Pupils: Pupils are equal, round, and reactive to light.  Cardiovascular:      Rate and Rhythm: Normal rate and regular rhythm.     Heart sounds: Normal heart sounds.  Pulmonary:     Effort: Pulmonary effort is normal. No respiratory distress.     Breath sounds: Normal breath sounds. No wheezing or rales.  Chest:     Chest wall: No tenderness.  Abdominal:     General: Bowel sounds are normal. There is no distension.     Palpations: Abdomen is soft. There is no mass.     Tenderness: There is no abdominal tenderness.  Musculoskeletal:        General: No deformity. Normal range of motion.     Cervical back: Normal range of motion and neck supple.  Lymphadenopathy:     Cervical: No cervical adenopathy.  Skin:    General: Skin is warm and dry.     Findings: No erythema or rash.  Neurological:     Mental Status: She is alert and oriented to person, place, and time.     Cranial Nerves: No cranial nerve deficit.     Coordination: Coordination normal.  Psychiatric:        Behavior: Behavior normal.        Thought Content: Thought content normal.  .    LABORATORY DATA:  I have reviewed the data as listed Lab Results  Component Value Date   WBC 6.7 12/12/2020   HGB 14.2 12/12/2020   HCT 42.2 12/12/2020   MCV 94.2 12/12/2020   PLT 215 12/12/2020   Recent Labs    12/12/20 0941  NA 139  K 4.4  CL 105  CO2 25  GLUCOSE 111*  BUN 22*  CREATININE 1.18*  CALCIUM 9.4  GFRNONAA 53*  PROT 7.0  ALBUMIN 4.0  AST 23  ALT 25  ALKPHOS 81  BILITOT 0.9        ASSESSMENT & PLAN:  1. Malignant neoplasm of upper-outer quadrant of right breast in female, estrogen receptor positive ([place])   2. Aromatase inhibitor use   3. Stage  3a chronic kidney disease ([place])   Cancer Staging Malignant neoplasm of upper-outer quadrant of right breast in female, estrogen receptor positive ([place]) Staging form: Breast, AJCC 8th Edition - Clinical stage from 07/27/2017: Stage IA (cT1, cN0, cM0, G2, ER+, PR+, HER2: Equivocal) - Signed by Earlie Server, MD on 07/27/2017 - Pathologic:  pT1c, pN0, cM0, ER+, PR+, HER2-, Oncotype DX score: 13 - Signed by Earlie Server, MD on 09/22/2017  #Stage 1A right breast cancer, ER PR positive, HER-2 negative. Oncotype score 13, Status post lumpectomy and sentinel lymph node biopsy, status post adjuvant radiation. Labs reviewed and discussed with patient Continue letrozole 2.5 mg daily.  Refills were sent to pharmacy.  Bone health, Discussed with patient and recommend DEXA every 2 years.   Patient is overdue for DEXA.  She plans to ask primary care provider to order.  # CKD, creatinine stable. Avoid neprotoxin.  #Chronic anticoagulation on Xarelto.  For atrial fibrillation.  Not managed by me,   We spent sufficient time to discuss many aspect of care, questions were answered to patient's satisfaction.  Return of visit: 6 months.   Earlie Server, MD, PhD  12/12/2020

## 2020-12-18 ENCOUNTER — Other Ambulatory Visit: Payer: Self-pay | Admitting: Oncology

## 2021-02-27 ENCOUNTER — Other Ambulatory Visit: Payer: Self-pay | Admitting: Internal Medicine

## 2021-02-27 DIAGNOSIS — N951 Menopausal and female climacteric states: Secondary | ICD-10-CM

## 2021-02-27 DIAGNOSIS — Z1231 Encounter for screening mammogram for malignant neoplasm of breast: Secondary | ICD-10-CM

## 2021-04-19 ENCOUNTER — Other Ambulatory Visit: Payer: Self-pay

## 2021-04-19 ENCOUNTER — Ambulatory Visit
Admission: RE | Admit: 2021-04-19 | Discharge: 2021-04-19 | Disposition: A | Discharge status: Home / Self Care | Payer: 59 | Source: Ambulatory Visit | Attending: Internal Medicine | Admitting: Internal Medicine

## 2021-04-19 DIAGNOSIS — N951 Menopausal and female climacteric states: Secondary | ICD-10-CM | POA: Diagnosis present

## 2021-06-15 ENCOUNTER — Inpatient Hospital Stay: Payer: 59 | Admitting: Oncology

## 2021-06-15 ENCOUNTER — Inpatient Hospital Stay: Payer: 59 | Attending: Oncology

## 2021-06-15 ENCOUNTER — Encounter: Payer: Self-pay | Admitting: Oncology

## 2021-06-15 VITALS — BP 144/87 | HR 52 | Temp 96.4°F | Wt 232.0 lb

## 2021-06-15 DIAGNOSIS — Z79811 Long term (current) use of aromatase inhibitors: Secondary | ICD-10-CM | POA: Diagnosis not present

## 2021-06-15 DIAGNOSIS — Z8614 Personal history of Methicillin resistant Staphylococcus aureus infection: Secondary | ICD-10-CM | POA: Insufficient documentation

## 2021-06-15 DIAGNOSIS — Z87442 Personal history of urinary calculi: Secondary | ICD-10-CM | POA: Insufficient documentation

## 2021-06-15 DIAGNOSIS — Z87440 Personal history of urinary (tract) infections: Secondary | ICD-10-CM | POA: Insufficient documentation

## 2021-06-15 DIAGNOSIS — Z841 Family history of disorders of kidney and ureter: Secondary | ICD-10-CM | POA: Diagnosis not present

## 2021-06-15 DIAGNOSIS — Z8049 Family history of malignant neoplasm of other genital organs: Secondary | ICD-10-CM | POA: Insufficient documentation

## 2021-06-15 DIAGNOSIS — C50411 Malignant neoplasm of upper-outer quadrant of right female breast: Secondary | ICD-10-CM

## 2021-06-15 DIAGNOSIS — Z8349 Family history of other endocrine, nutritional and metabolic diseases: Secondary | ICD-10-CM | POA: Insufficient documentation

## 2021-06-15 DIAGNOSIS — Z170 Estrogen receptor positive status [ER+]: Secondary | ICD-10-CM

## 2021-06-15 DIAGNOSIS — M2550 Pain in unspecified joint: Secondary | ICD-10-CM | POA: Diagnosis not present

## 2021-06-15 DIAGNOSIS — Z833 Family history of diabetes mellitus: Secondary | ICD-10-CM | POA: Insufficient documentation

## 2021-06-15 DIAGNOSIS — Z836 Family history of other diseases of the respiratory system: Secondary | ICD-10-CM | POA: Insufficient documentation

## 2021-06-15 DIAGNOSIS — Z923 Personal history of irradiation: Secondary | ICD-10-CM | POA: Insufficient documentation

## 2021-06-15 DIAGNOSIS — Z9049 Acquired absence of other specified parts of digestive tract: Secondary | ICD-10-CM | POA: Diagnosis not present

## 2021-06-15 DIAGNOSIS — N1831 Chronic kidney disease, stage 3a: Secondary | ICD-10-CM | POA: Insufficient documentation

## 2021-06-15 DIAGNOSIS — Z8249 Family history of ischemic heart disease and other diseases of the circulatory system: Secondary | ICD-10-CM | POA: Diagnosis not present

## 2021-06-15 DIAGNOSIS — Z7901 Long term (current) use of anticoagulants: Secondary | ICD-10-CM | POA: Insufficient documentation

## 2021-06-15 DIAGNOSIS — Z800 Family history of malignant neoplasm of digestive organs: Secondary | ICD-10-CM | POA: Diagnosis not present

## 2021-06-15 DIAGNOSIS — Z818 Family history of other mental and behavioral disorders: Secondary | ICD-10-CM | POA: Diagnosis not present

## 2021-06-15 DIAGNOSIS — R232 Flushing: Secondary | ICD-10-CM | POA: Diagnosis not present

## 2021-06-15 DIAGNOSIS — Z803 Family history of malignant neoplasm of breast: Secondary | ICD-10-CM | POA: Insufficient documentation

## 2021-06-15 LAB — CBC WITH DIFFERENTIAL/PLATELET
Abs Immature Granulocytes: 0.02 10*3/uL (ref 0.00–0.07)
Basophils Absolute: 0 10*3/uL (ref 0.0–0.1)
Basophils Relative: 1 %
Eosinophils Absolute: 0.2 10*3/uL (ref 0.0–0.5)
Eosinophils Relative: 2 %
HCT: 41 % (ref 36.0–46.0)
Hemoglobin: 13.8 g/dL (ref 12.0–15.0)
Immature Granulocytes: 0 %
Lymphocytes Relative: 29 %
Lymphs Abs: 2.2 10*3/uL (ref 0.7–4.0)
MCH: 32.1 pg (ref 26.0–34.0)
MCHC: 33.7 g/dL (ref 30.0–36.0)
MCV: 95.3 fL (ref 80.0–100.0)
Monocytes Absolute: 0.5 10*3/uL (ref 0.1–1.0)
Monocytes Relative: 7 %
Neutro Abs: 4.6 10*3/uL (ref 1.7–7.7)
Neutrophils Relative %: 61 %
Platelets: 239 10*3/uL (ref 150–400)
RBC: 4.3 MIL/uL (ref 3.87–5.11)
RDW: 12.8 % (ref 11.5–15.5)
WBC: 7.5 10*3/uL (ref 4.0–10.5)
nRBC: 0 % (ref 0.0–0.2)

## 2021-06-15 LAB — COMPREHENSIVE METABOLIC PANEL
ALT: 15 U/L (ref 0–44)
AST: 16 U/L (ref 15–41)
Albumin: 4 g/dL (ref 3.5–5.0)
Alkaline Phosphatase: 69 U/L (ref 38–126)
Anion gap: 6 (ref 5–15)
BUN: 27 mg/dL — ABNORMAL HIGH (ref 6–20)
CO2: 27 mmol/L (ref 22–32)
Calcium: 9.1 mg/dL (ref 8.9–10.3)
Chloride: 105 mmol/L (ref 98–111)
Creatinine, Ser: 0.98 mg/dL (ref 0.44–1.00)
GFR, Estimated: >60 mL/min (ref >60)
Glucose, Bld: 97 mg/dL (ref 70–99)
Potassium: 4.4 mmol/L (ref 3.5–5.1)
Sodium: 138 mmol/L (ref 135–145)
Total Bilirubin: 0.4 mg/dL (ref 0.3–1.2)
Total Protein: 6.8 g/dL (ref 6.5–8.1)

## 2021-06-15 MED ORDER — LETROZOLE 2.5 MG PO TABS: ORAL_TABLET | ORAL | 1 refills | Status: DC

## 2021-06-15 NOTE — Progress Notes (Signed)
?Hematology/Oncology follow up note ?[place] ?[phone] [phone] ? ? ?Patient Care Team: ?Perrin Maltese, MD as PCP - General (Internal Medicine) ?Dionisio David, MD as Consulting Physician (Cardiology) ?Earlie Server, MD as Consulting Physician (Oncology) ?Elsie Saas, MD as Consulting Physician (Orthopedic Surgery) ? ?REFERRING PROVIDER: ?Perrin Maltese, MD  ?REASON FOR VISIT ?Follow up for treatment of of breast cancer ? ?HISTORY OF PRESENTING ILLNESS:  ?Heather Conrad is a  61 y.o.  female with PMH listed below who was referred to me for evaluation of breast cancer.  ?Recent screening Mammogram showed 1. Complex mass in the right breast, predominantly cystic, with abnormal tissue along its anterior wall. Biopsy is recommended. 2. Benign left breast calcifications.Sonographic evaluation of the right axilla shows no enlarged or ?abnormal lymph nodes. ?US guided biopsy showed invasive mammary carcinoma, grade 2, ER 90%+, PR 90%+, HER 2 IHC equivative, awaiting FISH test.  ? ?# 09/01/2017 [name].  ?she is s/p right lumpectomy and right sentinel lymph node biopsy. 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  ?#Oncotype Dx recurrence score 13, absoluate chemotherapy risk<1%. Discussed with patient that adjuvant chemotherapy will not be offered.  ?#  finished adjuvant radiation 11/ 03/2017. ?#Lost to follow-up after 05/24/2019, and  resumed care since 12/12/2020. ? ?INTERVAL HISTORY ?[name] is a 61 y.o. female who has above history reviewed by me today presents for follow-up visit for management of breast cancer. pT1cN0 ?She tolerates Letrozole 2.5 mg daily.  Patient reports feeling well today.  Manageable side effects from letrozole. ?PCP has ordered bilateral screening mammogram-scheduled on 07/12/2021. ? ?04/19/2021, DEXA showed normal bone density. ? ?Review of Systems   ?Constitutional:  Negative for appetite change, chills, fatigue and fever.  ?HENT:   Negative for hearing loss and voice change.   ?Eyes:  Negative for eye problems.  ?Respiratory:  Negative for chest tightness and cough.   ?Cardiovascular:  Negative for chest pain.  ?Gastrointestinal:  Negative for abdominal distention, abdominal pain and blood in stool.  ?Endocrine: Positive for hot flashes.  ?Genitourinary:  Negative for difficulty urinating and frequency.   ?Musculoskeletal:  Positive for arthralgias.  ?Skin:  Negative for itching and rash.  ?Neurological:  Negative for extremity weakness.  ?Hematological:  Negative for adenopathy.  ?Psychiatric/Behavioral:  Negative for confusion.   ? ? ?MEDICAL HISTORY:  ?Past Medical History:  ?Diagnosis Date  ? Acute renal failure (ARF) ([place]) 01/2012  ? Anxiety   ? Arthritis 2003  ? SPINE AND RIGHT KNEE  ? Breast cancer ([place])   ? Breast cancer in female [facility]) 07/16/2017  ? Right-invasive mammary cancer  ? Cancer [facility])   ? Cough   ? Dry skin   ? Dyspnea   ? Dysrhythmia 01/2017  ? atrial fibrillation  ? Fatigue   ? Heart murmur   ? per pt report  ? History of kidney stones   ? History of MRSA infection 2013  ? leg, abdomen, arm  ? HPV in female   ? Hyperlipidemia 2018  ? Hypertension   ? Kidney stone 01/2017  ? Leg cramping   ? Leg pain   ? Lumbar stenosis   ? Nervousness   ? Obesity 01/2017  ? Palpitations   ? Personal history of radiation therapy   ? Pyelonephritis   ? Shortness of breath on exertion   ? Stress   ? Swelling of both lower extremities   ?  UTI (urinary tract infection)   ? Vitamin D deficiency   ? Weakness   ? ? ?SURGICAL HISTORY: ?Past Surgical History:  ?Procedure Laterality Date  ? BREAST BIOPSY Right 07/16/2017  ? Invasive mammary carcinoma  ? BREAST LUMPECTOMY Right 09/01/2017  ? F/U radation, clear margins  ? BREAST LUMPECTOMY WITH RADIOACTIVE SEED AND SENTINEL LYMPH NODE BIOPSY Right 09/01/2017  ? Procedure: BREAST LUMPECTOMY WITH RADIOACTIVE SEED AND  SENTINEL LYMPH NODE BIOPSY;  Surgeon: Alphonsa Overall, MD;  Location: [place];  Service: General;  Laterality: Right;  ? CARDIOVERSION Right 07/27/2020  ? Procedure: CARDIOVERSION;  Surgeon: Dionisio David, MD;  Location: [facility];  Service: Cardiovascular;  Laterality: Right;  ? CHOLECYSTECTOMY    ? 1992  ? DILATION AND CURETTAGE OF UTERUS    ? EXTRACORPOREAL SHOCK WAVE LITHOTRIPSY Left 02/13/2017  ? Procedure: EXTRACORPOREAL SHOCK WAVE LITHOTRIPSY (ESWL);  Surgeon: Royston Cowper, MD;  Location: [facility];  Service: Urology;  Laterality: Left;  ? FOOT FRACTURE SURGERY Right 2018  ? repair of trigonem of ankle. endoscopic fasciotomy  ? INDUCED ABORTION    ? KNEE SURGERY Right   ? knee scope for torn meniscus  ? LUMBAR LAMINECTOMY/DECOMPRESSION MICRODISCECTOMY Bilateral 10/15/2017  ? Procedure: Laminectomy and Foraminotomy - Lumbar one-two, Lumbar two-three, Lumbar three-four  - bilateral;  Surgeon: Eustace Moore, MD;  Location: [place];  Service: Neurosurgery;  Laterality: Bilateral;  ? ? ?SOCIAL HISTORY: ?Social History  ? ?Socioeconomic History  ? Marital status: Single  ?  Spouse name: Not on file  ? Number of children: 2  ? Years of education: 83  ? Highest education level: Not on file  ?Occupational History  ? Occupation: Loss adjuster, chartered  ?Tobacco Use  ? Smoking status: Never  ? Smokeless tobacco: Never  ?Vaping Use  ? Vaping Use: Never used  ?Substance and Sexual Activity  ? Alcohol use: Yes  ?  Comment: rare  ? Drug use: No  ? Sexual activity: Not Currently  ?  Birth control/protection: Post-menopausal  ?Other Topics Concern  ? Not on file  ?Social History Narrative  ? Not on file  ? ?Social Determinants of Health  ? ?Financial Resource Strain: Not on file  ?Food Insecurity: Not on file  ?Transportation Needs: Not on file  ?Physical Activity: Not on file  ?Stress: Not on file  ?Social Connections: Not on file  ?Intimate Partner Violence: Not on file  ? ? ?FAMILY HISTORY: ?Family History  ?Problem Relation Age of  Onset  ? Heart disease Father   ?     deceased 32; had 18 siblings  ? Hypertension Father   ? Kidney disease Father   ? Colon cancer Paternal Uncle 33  ?     deceased 16s  ? Hypertension Paternal Uncle   ? Diabetes Maternal Aunt   ? Hypertension Maternal Aunt   ? Heart disease Mother   ?     deceased at 98; had 7 siblings  ? Hypertension Mother   ? Hyperlipidemia Mother   ? Depression Mother   ? Anxiety disorder Mother   ? Sleep apnea Mother   ? Obesity Mother   ? Uterine cancer Sister 75  ?     currently 85; no children  ? Breast cancer Cousin 81  ?     currently 22; daughter of unaffected maternal aunt  ? ? ?ALLERGIES:  has No Known Allergies. ? ?MEDICATIONS:  ?Current Outpatient Medications  ?Medication Sig Dispense Refill  ? amiodarone (PACERONE) 200  MG tablet Take 800 mg by mouth in the morning.    ? Biotin 10000 MCG TABS Take 10,000 mcg by mouth in the morning.    ? lisinopril (ZESTRIL) 10 MG tablet Take 10 mg by mouth in the morning.    ? Multiple Vitamin (MULTIVITAMIN WITH MINERALS) TABS tablet Take 1 tablet by mouth daily. Centrum Silver for Women    ? rosuvastatin (CRESTOR) 20 MG tablet Take 20 mg by mouth at bedtime.    ? XARELTO 20 MG TABS tablet Take 20 mg by mouth in the morning.  2  ? letrozole (FEMARA) 2.5 MG tablet TAKE 1 TABLET(2.5 MG) BY MOUTH DAILY 90 tablet 1  ? metoprolol succinate (TOPROL-XL) 25 MG 24 hr tablet Take 12.5 mg by mouth in the morning. (Patient not taking: Reported on 12/12/2020)    ? ?No current facility-administered medications for this visit.  ? ? ? ?PHYSICAL EXAMINATION: ?ECOG PERFORMANCE STATUS: 1 - Symptomatic but completely ambulatory ?Vitals:  ? 06/15/21 0939  ?BP: (!) 144/87  ?Pulse: (!) 52  ?Temp: (!) 96.4 ?F (35.8 ?C)  ? ?Filed Weights  ? 06/15/21 0939  ?Weight: 232 lb (105.2 kg)  ? ? ?Physical Exam ?Constitutional:   ?   General: She is not in acute distress. ?   Appearance: She is obese.  ?HENT:  ?   Head: Normocephalic and atraumatic.  ?Eyes:  ?   General: No scleral  icterus. ?   Conjunctiva/sclera: Conjunctivae normal.  ?   Pupils: Pupils are equal, round, and reactive to light.  ?Cardiovascular:  ?   Rate and Rhythm: Normal rate and regular rhythm.  ?   Heart sounds: Normal

## 2021-08-08 ENCOUNTER — Ambulatory Visit
Admission: RE | Admit: 2021-08-08 | Discharge: 2021-08-08 | Disposition: A | Discharge status: Home / Self Care | Payer: 59 | Source: Ambulatory Visit | Attending: Internal Medicine | Admitting: Internal Medicine

## 2021-08-08 DIAGNOSIS — Z1231 Encounter for screening mammogram for malignant neoplasm of breast: Secondary | ICD-10-CM | POA: Insufficient documentation

## 2021-10-24 ENCOUNTER — Encounter (INDEPENDENT_AMBULATORY_CARE_PROVIDER_SITE_OTHER): Payer: Self-pay

## 2021-11-20 ENCOUNTER — Ambulatory Visit (INDEPENDENT_AMBULATORY_CARE_PROVIDER_SITE_OTHER): Payer: 59 | Admitting: Obstetrics

## 2021-11-20 ENCOUNTER — Other Ambulatory Visit (HOSPITAL_COMMUNITY)
Admission: RE | Admit: 2021-11-20 | Discharge: 2021-11-20 | Disposition: A | Discharge status: Home / Self Care | Payer: 59 | Source: Ambulatory Visit | Attending: Obstetrics | Admitting: Obstetrics

## 2021-11-20 ENCOUNTER — Encounter: Payer: Self-pay | Admitting: Obstetrics

## 2021-11-20 VITALS — BP 140/80 | Ht 64.0 in | Wt 238.0 lb

## 2021-11-20 DIAGNOSIS — Z124 Encounter for screening for malignant neoplasm of cervix: Secondary | ICD-10-CM | POA: Insufficient documentation

## 2021-11-20 NOTE — Progress Notes (Signed)
Gynecology Annual Exam  PCP: Perrin Maltese, MD  Chief Complaint:  Chief Complaint  Patient presents with   Annual Exam    History of Present Illness:Patient is a 61 y.o. [id number] presents for annual exam. The patient has no complaints today. Last year she shared that she was planning to go to [place] for gastric bypass.She is using a can to ambulate today.She did go to [place] and has had athe gastric sleeve procedure. Has lost 100 lbs.  LMP: No LMP recorded (lmp unknown). Patient is postmenopausal.  The patient is not currently sexually active. She denies dyspareunia.  The patient does perform self breast exams.  There is notable family history of breast or ovarian cancer in her family.She had a lumpectomy several years ago.  The patient wears seatbelts: yes.   The patient has regular exercise: no.    The patient denies current symptoms of depression.     Review of Systems: Review of Systems  Constitutional: Negative.   HENT: Negative.    Eyes: Negative.   Respiratory: Negative.    Cardiovascular: Negative.        Essefctial hypertension. BP today is 140/80  Gastrointestinal:        Had gastric sleeve procedure last year.  Genitourinary: Negative.   Musculoskeletal:  Positive for joint pain.       Bilateral knee pain. "Needs knee replacement"  Skin: Negative.   Neurological: Negative.   Endo/Heme/Allergies: Negative.   Psychiatric/Behavioral: Negative.      Past Medical History:  Patient Active Problem List   Diagnosis Date Noted   Morbid obesity with BMI of 60.0-69.9, adult ([place]) 06/04/2019   Atrial fibrillation ([place]) 01/18/2018   Other fatigue 11/25/2017   Shortness of breath on exertion 11/25/2017   Essential hypertension 11/25/2017   S/P lumbar laminectomy 10/15/2017   Goals of care, counseling/discussion 08/14/2017   Malignant neoplasm of upper-outer quadrant of right breast in female, estrogen receptor positive ([place]) 07/27/2017   UTI (lower urinary tract  infection) 05/10/2011   Sepsis due to Escherichia coli (E. coli) ([place]) 05/10/2011   Nephrolithiasis 05/07/2011   Acute renal failure ([place]) 05/07/2011    Past Surgical History:  Past Surgical History:  Procedure Laterality Date   BREAST BIOPSY Right 07/16/2017   Invasive mammary carcinoma   BREAST LUMPECTOMY Right 09/01/2017   F/U radation, clear margins   BREAST LUMPECTOMY WITH RADIOACTIVE SEED AND SENTINEL LYMPH NODE BIOPSY Right 09/01/2017   Procedure: BREAST LUMPECTOMY WITH RADIOACTIVE SEED AND SENTINEL LYMPH NODE BIOPSY;  Surgeon: Alphonsa Overall, MD;  Location: [place];  Service: General;  Laterality: Right;   CARDIOVERSION Right 07/27/2020   Procedure: CARDIOVERSION;  Surgeon: Dionisio David, MD;  Location: [facility];  Service: Cardiovascular;  Laterality: Right;   [place] LITHOTRIPSY Left 02/13/2017   Procedure: EXTRACORPOREAL SHOCK WAVE LITHOTRIPSY (ESWL);  Surgeon: Royston Cowper, MD;  Location: [facility];  Service: Urology;  Laterality: Left;   FOOT FRACTURE SURGERY Right 2018   repair of trigonem of ankle. endoscopic fasciotomy   INDUCED ABORTION     KNEE SURGERY Right    knee scope for torn meniscus   LUMBAR LAMINECTOMY/DECOMPRESSION MICRODISCECTOMY Bilateral 10/15/2017   Procedure: Laminectomy and Foraminotomy - Lumbar one-two, Lumbar two-three, Lumbar three-four  - bilateral;  Surgeon: Eustace Moore, MD;  Location: [place];  Service: Neurosurgery;  Laterality: Bilateral;    Gynecologic  History:  No LMP recorded (lmp unknown). Patient is postmenopausal. Last Pap: Results were: NILM no abnormalities  Last mammogram: 2023 Results were: BI-RAD II  Obstetric History: [id number]  Family History:  Family History  Problem Relation Age of Onset   Heart disease Father        deceased 91; had 39 siblings   Hypertension Father    Kidney disease Father    Colon cancer Paternal Uncle 48        deceased 78s   Hypertension Paternal Uncle    Diabetes Maternal Aunt    Hypertension Maternal Aunt    Heart disease Mother        deceased at 77; had 54 siblings   Hypertension Mother    Hyperlipidemia Mother    Depression Mother    Anxiety disorder Mother    Sleep apnea Mother    Obesity Mother    Uterine cancer Sister 23       currently 28; no children   Breast cancer Cousin 47       currently 61; daughter of unaffected maternal aunt    Social History:  Social History   Socioeconomic History   Marital status: Single    Spouse name: Not on file   Number of children: 2   Years of education: 12   Highest education level: Not on file  Occupational History   Occupation: Loss adjuster, chartered  Tobacco Use   Smoking status: Never   Smokeless tobacco: Never  Vaping Use   Vaping Use: Never used  Substance and Sexual Activity   Alcohol use: Yes    Comment: rare   Drug use: No   Sexual activity: Not Currently    Birth control/protection: Post-menopausal  Other Topics Concern   Not on file  Social History Narrative   Not on file   Social Determinants of Health   Financial Resource Strain: Not on file  Food Insecurity: Not on file  Transportation Needs: Not on file  Physical Activity: Not on file  Stress: Not on file  Social Connections: Not on file  Intimate Partner Violence: Not on file    Allergies:  No Known Allergies  Medications: Prior to Admission medications   Medication Sig Start Date End Date Taking? Authorizing Provider  amiodarone (PACERONE) 200 MG tablet Take 800 mg by mouth in the morning. 07/10/20  Yes [provider]  Biotin 10000 MCG TABS Take 10,000 mcg by mouth in the morning.   Yes [provider]  letrozole (FEMARA) 2.5 MG tablet TAKE 1 TABLET(2.5 MG) BY MOUTH DAILY 06/15/21  Yes Earlie Server, MD  lisinopril (ZESTRIL) 20 MG tablet Take 20 mg by mouth daily. 11/16/21  Yes [provider]  Multiple Vitamin (MULTIVITAMIN WITH  MINERALS) TABS tablet Take 1 tablet by mouth daily. Centrum Silver for Women   Yes [provider]  rosuvastatin (CRESTOR) 20 MG tablet Take 20 mg by mouth at bedtime. 06/14/21  Yes [provider]  XARELTO 20 MG TABS tablet Take 20 mg by mouth in the morning. 10/21/17  Yes [provider]  metoprolol succinate (TOPROL-XL) 25 MG 24 hr tablet Take 12.5 mg by mouth in the morning. Patient not taking: Reported on 12/12/2020 05/11/20   [provider]    Physical Exam Vitals: Blood pressure (!) 140/80, height '5\' 4"'$  (1.626 m), weight 238 lb (108 kg).  General: NAD HEENT: normocephalic, anicteric Thyroid: no enlargement, no palpable nodules Pulmonary: No increased work of breathing, CTAB Cardiovascular: RRR, distal pulses  2+ Breast: Breast symmetrical, pendulous, no tenderness, no palpable nodules or masses, no skin or nipple retraction present, no nipple discharge.  No axillary or supraclavicular lymphadenopathy. Abdomen: NABS, soft, non-tender, non-distended.  Umbilicus without lesions.  No hepatomegaly, splenomegaly or masses palpable. No evidence of hernia She has much loose skin since her weight loss Genitourinary:  External: Normal external female genitalia.  Normal urethral meatus, normal Bartholin's and Skene's glands.    Vagina: Normal vaginal mucosa, no evidence of prolapse.    Cervix: Grossly normal in appearance, no bleeding  Uterus: Non-enlarged, mobile, normal contour.  No CMT  Adnexa: ovaries non-enlarged, no adnexal masses  Rectal: deferred  Lymphatic: no evidence of inguinal lymphadenopathy Extremities: no edema, erythema, or tenderness Neurologic: Grossly intact Psychiatric: mood appropriate, affect full  Female chaperone present for pelvic and breast  portions of the physical exam     Assessment: 61 y.o. [id number] routine annual  gynecologic exam Up to date on mammogram For pap smear today.  Plan: Problem List Items Addressed This Visit    None   1) Mammogram - recommend yearly screening mammogram.  Mammogram Is up to date  2) STI screening  wasoffered and declined  3) ASCCP guidelines and rational discussed.  Patient opts for every 5 years screening interval  4) Osteoporosis  She has had a Dexa scan this year (2023) - Managed by her PCP   5) Routine healthcare maintenance including cholesterol, diabetes screening discussed managed by PCP  6) Colonoscopy per her PCP.  Screening recommended starting at age 21 for average risk individuals, age 63 for individuals deemed at increased risk (including African Americans) and recommended to continue until age 40.  For patient age 57-85 individualized approach is recommended.  Gold standard screening is via colonoscopy, Cologuard screening is an acceptable alternative for patient unwilling or unable to undergo colonoscopy.  "Colorectal cancer screening for average?risk adults: 2018 guideline update from the [place]: A Cancer Journal for Clinicians: Aug 14, 2016   7) Return in about 1 year (around 11/21/2022) for annual -gyn physical.    Imagene Riches, CNM  11/20/2021 8:34 AM   Mosetta Pigeon, [facility] 11/20/2021, 8:34 AM

## 2021-11-23 LAB — CYTOLOGY - PAP
Comment: NEGATIVE
Diagnosis: NEGATIVE
High risk HPV: NEGATIVE

## 2021-11-25 ENCOUNTER — Encounter: Payer: Self-pay | Admitting: Obstetrics

## 2021-12-11 ENCOUNTER — Telehealth: Payer: Self-pay

## 2021-12-11 NOTE — Telephone Encounter (Signed)
Patient called wanting to reschedule appointments. Callback # [phone]

## 2021-12-14 ENCOUNTER — Inpatient Hospital Stay: Payer: 59

## 2021-12-14 ENCOUNTER — Inpatient Hospital Stay: Payer: 59 | Admitting: Oncology

## 2021-12-20 ENCOUNTER — Other Ambulatory Visit: Payer: Self-pay | Admitting: Oncology

## 2022-01-22 ENCOUNTER — Inpatient Hospital Stay: Payer: 59 | Admitting: Oncology

## 2022-01-22 ENCOUNTER — Inpatient Hospital Stay: Payer: 59

## 2022-04-03 ENCOUNTER — Other Ambulatory Visit: Payer: Self-pay

## 2022-04-03 MED ORDER — LETROZOLE 2.5 MG PO TABS: ORAL_TABLET | ORAL | 1 refills | Status: DC

## 2022-04-29 ENCOUNTER — Ambulatory Visit: Payer: 59 | Admitting: Internal Medicine

## 2022-04-29 ENCOUNTER — Encounter: Payer: Self-pay | Admitting: Internal Medicine

## 2022-04-29 VITALS — BP 158/84 | HR 46 | Ht 63.0 in | Wt 238.0 lb

## 2022-04-29 DIAGNOSIS — H8113 Benign paroxysmal vertigo, bilateral: Secondary | ICD-10-CM | POA: Diagnosis not present

## 2022-04-29 DIAGNOSIS — I10 Essential (primary) hypertension: Secondary | ICD-10-CM

## 2022-04-29 DIAGNOSIS — R42 Dizziness and giddiness: Secondary | ICD-10-CM | POA: Diagnosis not present

## 2022-04-29 DIAGNOSIS — J301 Allergic rhinitis due to pollen: Secondary | ICD-10-CM

## 2022-04-29 DIAGNOSIS — I480 Paroxysmal atrial fibrillation: Secondary | ICD-10-CM

## 2022-04-29 DIAGNOSIS — E782 Mixed hyperlipidemia: Secondary | ICD-10-CM

## 2022-04-29 MED ORDER — AMLODIPINE BESYLATE 5 MG PO TABS: 5.0000 mg | ORAL_TABLET | Freq: Every day | ORAL | 11 refills | Status: DC

## 2022-04-29 MED ORDER — LORATADINE 10 MG PO TABS: 10.0000 mg | ORAL_TABLET | Freq: Every day | ORAL | 1 refills | Status: DC

## 2022-04-29 MED ORDER — MECLIZINE HCL 25 MG PO TABS: 25.0000 mg | ORAL_TABLET | Freq: Three times a day (TID) | ORAL | 0 refills | Status: DC | PRN

## 2022-04-29 MED ORDER — FLUTICASONE PROPIONATE 50 MCG/ACT NA SUSP: 1.0000 | Freq: Every day | NASAL | 3 refills | Status: DC

## 2022-04-29 NOTE — Progress Notes (Signed)
Established Patient Office Visit  Subjective:  Patient ID: Heather Conrad, female    DOB: 05-16-1960  Age: 62 y.o. MRN: [id number]  Chief Complaint  Patient presents with   Follow-up    BP running high/dizziness    Patient comes in today for follow-up and also has complaints of dizziness which started 3 days ago.  Patient has missed her previous appointments and has gained weight since the last visit.  She was given a prescription for [facility] which she was not able to start due to insurance denial.  Patient reports that her blood pressure has been running high at home and she checks it frequently.  Today in the office it is 158/84. She takes her Lisinopril regularly. Patient describes her dizziness as a sensation that the room is spinning around her especially when she turns her head and also when she tries to lie down and when she gets up from a lying down position.  So most of the symptoms are positional.  She denies any nausea or vomiting ,no headache.  No focal neurological deficits.  She has been feeling strange sound or noise in both her ears for the last couple of years.  She does have allergies and sinus problems on and off during the year.  Today she does not have any fevers or chills no cough no sore throat. Patient will come back fasting for her blood work.  EKG done today in the office shows sinus bradycardia and no acute changes. Will add Amlodipine 5 mg po daily. Also add Flonase and Claritin for allergic rhinitis. Meclizine prn for Vertigo.     Past Medical History:  Diagnosis Date   Acute renal failure (ARF) ([place]) 01/2012   Anxiety    Arthritis 2003   SPINE AND RIGHT KNEE   Breast cancer ([place])    Breast cancer in female ([place]) 07/16/2017   Right-invasive mammary cancer   Cancer ([place])    Cough    Dry skin    Dyspnea    Dysrhythmia 01/2017   atrial fibrillation   Fatigue    Heart murmur    per pt report   History of kidney stones    History of MRSA infection  2013   leg, abdomen, arm   HPV in female    Hyperlipidemia 2018   Hypertension    Kidney stone 01/2017   Leg cramping    Leg pain    Lumbar stenosis    Nervousness    Obesity 01/2017   Palpitations    Personal history of radiation therapy    Pyelonephritis    Shortness of breath on exertion    Stress    Swelling of both lower extremities    UTI (urinary tract infection)    Vitamin D deficiency    Weakness     Social History   Socioeconomic History   Marital status: Single    Spouse name: Not on file   Number of children: 2   Years of education: 12   Highest education level: Not on file  Occupational History   Occupation: Loss adjuster, [employer]ered  Tobacco Use   Smoking status: Never   Smokeless tobacco: Never  Vaping Use   Vaping Use: Never used  Substance and Sexual Activity   Alcohol use: Yes    Comment: rare   Drug use: No   Sexual activity: Not Currently    Birth control/protection: Post-menopausal  Other Topics Concern   Not on file  Social History Narrative  Not on file   Social Determinants of Health   Financial Resource Strain: Not on file  Food Insecurity: Not on file  Transportation Needs: Not on file  Physical Activity: Not on file  Stress: Not on file  Social Connections: Not on file  Intimate Partner Violence: Not on file    Family History  Problem Relation Age of Onset   Heart disease Father        deceased 44; had 28 siblings   Hypertension Father    Kidney disease Father    Colon cancer Paternal Uncle 17       deceased 34s   Hypertension Paternal Uncle    Diabetes Maternal Aunt    Hypertension Maternal Aunt    Heart disease Mother        deceased at 74; had 3 siblings   Hypertension Mother    Hyperlipidemia Mother    Depression Mother    Anxiety disorder Mother    Sleep apnea Mother    Obesity Mother    Uterine cancer Sister 12       currently 60; no children   Breast cancer Cousin 78       currently 90; daughter of unaffected  maternal aunt    No Known Allergies  Review of Systems  Constitutional:  Positive for malaise/fatigue. Negative for chills and fever.  HENT:  Positive for congestion, sinus pain and tinnitus. Negative for ear discharge, ear pain, nosebleeds and sore throat.   Eyes:  Negative for blurred vision, photophobia, pain, discharge and redness.  Respiratory:  Negative for cough, hemoptysis, sputum production, shortness of breath, wheezing and stridor.   Cardiovascular:  Negative for chest pain, palpitations, orthopnea, claudication, leg swelling and PND.  Gastrointestinal:  Negative for abdominal pain, blood in stool, constipation, diarrhea, heartburn, nausea and vomiting.  Genitourinary:  Negative for dysuria, flank pain, frequency, hematuria and urgency.  Skin:  Negative for itching and rash.  Neurological:  Positive for dizziness. Negative for tingling, speech change, focal weakness, seizures, weakness and headaches.  Endo/Heme/Allergies:  Negative for environmental allergies and polydipsia. Does not bruise/bleed easily.  Psychiatric/Behavioral:  Negative for depression, hallucinations, memory loss and substance abuse. The patient is not nervous/anxious and does not have insomnia.        Objective:   BP (!) 158/84   Pulse (!) 46   Ht 5' 3"$  (1.6 m)   Wt 238 lb (108 kg)   LMP  (LMP Unknown)   SpO2 97%   BMI 42.16 kg/m   Vitals:   04/29/22 1548  BP: (!) 158/84  Pulse: (!) 46  Height: 5' 3"$  (1.6 m)  Weight: 238 lb (108 kg)  SpO2: 97%  BMI (Calculated): 42.17    Physical Exam HENT:     Head: Normocephalic.     Right Ear: Tympanic membrane, ear canal and external ear normal.     Left Ear: Tympanic membrane, ear canal and external ear normal.     Ears:     Comments: Fluid behind both TM.    Nose: Nose normal.  Eyes:     Pupils: Pupils are equal, round, and reactive to light.  Cardiovascular:     Rate and Rhythm: Normal rate and regular rhythm.  Pulmonary:     Effort:  Pulmonary effort is normal.     Breath sounds: Normal breath sounds.  Abdominal:     General: Abdomen is flat.     Palpations: Abdomen is soft.  Musculoskeletal:  General: Normal range of motion.     Cervical back: Normal range of motion and neck supple.  Skin:    General: Skin is warm and dry.  Neurological:     Mental Status: She is alert.  Psychiatric:        Mood and Affect: Mood normal.        Behavior: Behavior normal.      No results found for any visits on 04/29/22.  No results found for this or any previous visit (from the past 2160 hour(s)).    Assessment & Plan:   Problem List Items Addressed This Visit     Atrial fibrillation ([place])   Relevant Medications   amLODipine (NORVASC) 5 MG tablet   Other Relevant Orders   EKG 12-Lead   Mixed hyperlipidemia   Relevant Medications   amLODipine (NORVASC) 5 MG tablet   Other Visit Diagnoses     Essential hypertension, benign    -  Primary   Relevant Medications   amLODipine (NORVASC) 5 MG tablet   Seasonal allergic rhinitis due to pollen       Relevant Medications   fluticasone (FLONASE) 50 MCG/ACT nasal spray   loratadine (CLARITIN) 10 MG tablet   Benign paroxysmal positional vertigo due to bilateral vestibular disorder       Relevant Medications   meclizine (ANTIVERT) 25 MG tablet   Dizziness           @VISPATINSTR$ @  Return in about 2 weeks (around 05/13/2022).   Total time spent: 30 minutes  Perrin Maltese, MD  04/29/2022

## 2022-05-17 ENCOUNTER — Ambulatory Visit: Payer: 59 | Admitting: Internal Medicine

## 2022-06-25 ENCOUNTER — Other Ambulatory Visit: Payer: Self-pay | Admitting: Cardiovascular Disease

## 2022-07-04 ENCOUNTER — Ambulatory Visit: Payer: 59 | Admitting: Internal Medicine

## 2022-07-05 ENCOUNTER — Other Ambulatory Visit: Payer: 59

## 2022-07-05 ENCOUNTER — Other Ambulatory Visit: Payer: Self-pay | Admitting: Internal Medicine

## 2022-07-05 DIAGNOSIS — Z131 Encounter for screening for diabetes mellitus: Secondary | ICD-10-CM

## 2022-07-05 DIAGNOSIS — I10 Essential (primary) hypertension: Secondary | ICD-10-CM

## 2022-07-06 LAB — CBC WITH DIFFERENTIAL/PLATELET
Basophils Absolute: 0.1 10*3/uL (ref 0.0–0.2)
Basos: 1 %
EOS (ABSOLUTE): 0.2 10*3/uL (ref 0.0–0.4)
Eos: 3 %
Hematocrit: 42.8 % (ref 34.0–46.6)
Hemoglobin: 14.2 g/dL (ref 11.1–15.9)
Immature Grans (Abs): 0 10*3/uL (ref 0.0–0.1)
Immature Granulocytes: 0 %
Lymphocytes Absolute: 1.8 10*3/uL (ref 0.7–3.1)
Lymphs: 34 %
MCH: 30.9 pg (ref 26.6–33.0)
MCHC: 33.2 g/dL (ref 31.5–35.7)
MCV: 93 fL (ref 79–97)
Monocytes Absolute: 0.4 10*3/uL (ref 0.1–0.9)
Monocytes: 7 %
Neutrophils Absolute: 2.8 10*3/uL (ref 1.4–7.0)
Neutrophils: 55 %
Platelets: 230 10*3/uL (ref 150–450)
RBC: 4.6 x10E6/uL (ref 3.77–5.28)
RDW: 12.3 % (ref 11.7–15.4)
WBC: 5.1 10*3/uL (ref 3.4–10.8)

## 2022-07-06 LAB — CMP14+EGFR
ALT: 14 IU/L (ref 0–32)
AST: 20 IU/L (ref 0–40)
Albumin/Globulin Ratio: 1.9 (ref 1.2–2.2)
Albumin: 4.4 g/dL (ref 3.8–4.9)
Alkaline Phosphatase: 90 IU/L (ref 44–121)
BUN/Creatinine Ratio: 28 (ref 12–28)
BUN: 31 mg/dL — ABNORMAL HIGH (ref 8–27)
Bilirubin Total: 0.6 mg/dL (ref 0.0–1.2)
CO2: 25 mmol/L (ref 20–29)
Calcium: 9.7 mg/dL (ref 8.7–10.3)
Chloride: 106 mmol/L (ref 96–106)
Creatinine, Ser: 1.12 mg/dL — ABNORMAL HIGH (ref 0.57–1.00)
Globulin, Total: 2.3 g/dL (ref 1.5–4.5)
Glucose: 89 mg/dL (ref 70–99)
Potassium: 5.3 mmol/L — ABNORMAL HIGH (ref 3.5–5.2)
Sodium: 144 mmol/L (ref 134–144)
Total Protein: 6.7 g/dL (ref 6.0–8.5)
eGFR: 56 mL/min/{1.73_m2} — ABNORMAL LOW (ref >59)

## 2022-07-06 LAB — HEMOGLOBIN A1C
Est. average glucose Bld gHb Est-mCnc: 100 mg/dL
Hgb A1c MFr Bld: 5.1 % (ref 4.8–5.6)

## 2022-07-11 ENCOUNTER — Ambulatory Visit: Payer: 59 | Admitting: Internal Medicine

## 2022-07-11 ENCOUNTER — Encounter: Payer: Self-pay | Admitting: Internal Medicine

## 2022-07-11 ENCOUNTER — Telehealth: Payer: Self-pay

## 2022-07-11 VITALS — BP 118/64 | HR 47 | Ht 63.0 in | Wt 235.6 lb

## 2022-07-11 DIAGNOSIS — I10 Essential (primary) hypertension: Secondary | ICD-10-CM | POA: Diagnosis not present

## 2022-07-11 DIAGNOSIS — Z853 Personal history of malignant neoplasm of breast: Secondary | ICD-10-CM | POA: Diagnosis not present

## 2022-07-11 DIAGNOSIS — I482 Chronic atrial fibrillation, unspecified: Secondary | ICD-10-CM

## 2022-07-11 DIAGNOSIS — J301 Allergic rhinitis due to pollen: Secondary | ICD-10-CM | POA: Insufficient documentation

## 2022-07-11 DIAGNOSIS — Z1211 Encounter for screening for malignant neoplasm of colon: Secondary | ICD-10-CM

## 2022-07-11 DIAGNOSIS — E782 Mixed hyperlipidemia: Secondary | ICD-10-CM

## 2022-07-11 NOTE — Telephone Encounter (Signed)
Patient is looking for Cardiac clearance for [facility] Dr Cassell Smiles

## 2022-07-11 NOTE — Progress Notes (Signed)
Established Patient Office Visit  Subjective:  Patient ID: Heather Conrad, female    DOB: [birth year]  Age: 62 y.o. MRN: [id number]  Chief Complaint  Patient presents with   Follow-up    2 week follow up    Patient comes in for her follow-up today.  Her blood pressure is looking much better and she does not have any more dizziness.  At her last visit Norvasc was added to her regimen so it is controlling her blood pressure well.  She did not take meclizine for very long as her positional vertigo has also eased up.  Overall she is feeling well and she is getting ready for her total knee replacement.  She denies any chest pain, no shortness of breath and no palpitations.  Patient is also due for her mammogram we will get a date. She is also supposed to get a colonoscopy this year for personal history of polyps, will send referral to GI. Labs were done earlier on discussed today.  Her potassium was slightly above normal, could be hemolysis.  Will repeat    No other concerns at this time.   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   Atrial fibrillation    Breast cancer    Breast cancer in female 07/16/2017   Right-invasive mammary cancer   Cancer    Cough    Dry skin    Dyspnea    Dysrhythmia 01/2017   atrial fibrillation   Fatigue    Heart murmur    per pt report   History of kidney stones    History of MRSA infection 2013   leg, abdomen, arm   HPV in female    Hyperlipidemia 2018   Hypertension    Kidney stone 01/2017   Leg cramping    Leg pain    Lumbar stenosis    Mixed hyperlipidemia    Nervousness    Obesity 01/2017   Palpitations    Personal history of radiation therapy    Pyelonephritis    Shortness of breath on exertion    Stress    Swelling of both lower extremities    UTI (urinary tract infection)    Vitamin D deficiency    Weakness     Past Surgical History:   Procedure Laterality Date   BREAST BIOPSY Right 07/16/2017   Invasive mammary carcinoma   BREAST LUMPECTOMY Right 09/01/2017   F/U radation, clear margins   BREAST LUMPECTOMY WITH RADIOACTIVE SEED AND SENTINEL LYMPH NODE BIOPSY Right 09/01/2017   Procedure: BREAST LUMPECTOMY WITH RADIOACTIVE SEED AND SENTINEL LYMPH NODE BIOPSY;  Surgeon: Ovidio Kin, MD;  Location: [facility] OR;  Service: General;  Laterality: Right;   CARDIOVERSION Right 07/27/2020   Procedure: CARDIOVERSION;  Surgeon: Laurier Nancy, MD;  Location: [facility] ORS;  Service: Cardiovascular;  Laterality: Right;   CHOLECYSTECTOMY     1992   DILATION AND CURETTAGE OF UTERUS     EXTRACORPOREAL SHOCK WAVE LITHOTRIPSY Left 02/13/2017   Procedure: EXTRACORPOREAL SHOCK WAVE LITHOTRIPSY (ESWL);  Surgeon: Orson Ape, MD;  Location: [facility] ORS;  Service: Urology;  Laterality: Left;   FOOT FRACTURE SURGERY Right 2018   repair of trigonem of ankle. endoscopic fasciotomy   INDUCED ABORTION     KNEE SURGERY Right    knee scope for torn meniscus   LUMBAR LAMINECTOMY/DECOMPRESSION MICRODISCECTOMY Bilateral 10/15/2017   Procedure: Laminectomy  and Foraminotomy - Lumbar one-two, Lumbar two-three, Lumbar three-four  - bilateral;  Surgeon: Tia Alert, MD;  Location: [facility] OR;  Service: Neurosurgery;  Laterality: Bilateral;    Social History   Socioeconomic History   Marital status: Single    Spouse name: Not on file   Number of children: 2   Years of education: 12   Highest education level: Not on file  Occupational History   Occupation: Architect  Tobacco Use   Smoking status: Never   Smokeless tobacco: Never  Vaping Use   Vaping Use: Never used  Substance and Sexual Activity   Alcohol use: Yes    Comment: rare   Drug use: No   Sexual activity: Not Currently    Birth control/protection: Post-menopausal  Other Topics Concern   Not on file  Social History Narrative   Not on file   Social Determinants of Health    Financial Resource Strain: Not on file  Food Insecurity: Not on file  Transportation Needs: Not on file  Physical Activity: Not on file  Stress: Not on file  Social Connections: Not on file  Intimate Partner Violence: Not on file    Family History  Problem Relation Age of Onset   Heart disease Father        deceased 62; had 15 siblings   Hypertension Father    Kidney disease Father    Colon cancer Paternal Uncle 74       deceased 2s   Hypertension Paternal Uncle    Diabetes Maternal Aunt    Hypertension Maternal Aunt    Heart disease Mother        deceased at 83; had 7 siblings   Hypertension Mother    Hyperlipidemia Mother    Depression Mother    Anxiety disorder Mother    Sleep apnea Mother    Obesity Mother    Uterine cancer Sister 37       currently 60; no children   Breast cancer Cousin 30       currently 53; daughter of unaffected maternal aunt    No Known Allergies  Review of Systems  Constitutional: Negative.   HENT: Negative.    Eyes: Negative.   Respiratory: Negative.    Cardiovascular: Negative.   Gastrointestinal: Negative.   Musculoskeletal: Negative.   Skin: Negative.   Neurological: Negative.   Psychiatric/Behavioral: Negative.         Objective:   BP 118/64   Pulse (!) 47   Ht 5\' 3"  (1.6 m)   Wt 235 lb 9.6 oz (106.9 kg)   LMP  (LMP Unknown)   SpO2 98%   BMI 41.73 kg/m   Vitals:   07/11/22 0942  BP: 118/64  Pulse: (!) 47  Height: 5\' 3"  (1.6 m)  Weight: 235 lb 9.6 oz (106.9 kg)  SpO2: 98%  BMI (Calculated): 41.74    Physical Exam Vitals and nursing note reviewed.  Constitutional:      Appearance: Normal appearance.  HENT:     Head: Normocephalic.  Cardiovascular:     Rate and Rhythm: Normal rate and regular rhythm.     Pulses: Normal pulses.     Heart sounds: Normal heart sounds. No murmur heard. Pulmonary:     Effort: Pulmonary effort is normal. No respiratory distress.     Breath sounds: Normal breath sounds. No  stridor.  Abdominal:     General: Bowel sounds are normal. There is no distension.     Palpations: Abdomen  is soft. There is no mass.     Tenderness: There is no abdominal tenderness.  Musculoskeletal:        General: Normal range of motion.     Cervical back: Normal range of motion and neck supple.     Right lower leg: No edema.     Left lower leg: No edema.  Skin:    General: Skin is warm and dry.  Neurological:     General: No focal deficit present.     Mental Status: She is alert.  Psychiatric:        Mood and Affect: Mood normal.        Behavior: Behavior normal.      No results found for any visits on 07/11/22.  Recent Results (from the past 2160 hour(s))  Hemoglobin A1c     Status: None   Collection Time: 07/05/22  9:23 AM  Result Value Ref Range   Hgb A1c MFr Bld 5.1 4.8 - 5.6 %    Comment:          Prediabetes: 5.7 - 6.4          Diabetes: >6.4          Glycemic control for adults with diabetes: <7.0    Est. average glucose Bld gHb Est-mCnc 100 mg/dL  CBC with Diff     Status: None   Collection Time: 07/05/22  9:23 AM  Result Value Ref Range   WBC 5.1 3.4 - 10.8 x10E3/uL   RBC 4.60 3.77 - 5.28 x10E6/uL   Hemoglobin 14.2 11.1 - 15.9 g/dL   Hematocrit 16.1 09.6 - 46.6 %   MCV 93 79 - 97 fL   MCH 30.9 26.6 - 33.0 pg   MCHC 33.2 31.5 - 35.7 g/dL   RDW 04.5 40.9 - 81.1 %   Platelets 230 150 - 450 x10E3/uL   Neutrophils 55 Not Estab. %   Lymphs 34 Not Estab. %   Monocytes 7 Not Estab. %   Eos 3 Not Estab. %   Basos 1 Not Estab. %   Neutrophils Absolute 2.8 1.4 - 7.0 x10E3/uL   Lymphocytes Absolute 1.8 0.7 - 3.1 x10E3/uL   Monocytes Absolute 0.4 0.1 - 0.9 x10E3/uL   EOS (ABSOLUTE) 0.2 0.0 - 0.4 x10E3/uL   Basophils Absolute 0.1 0.0 - 0.2 x10E3/uL   Immature Granulocytes 0 Not Estab. %   Immature Grans (Abs) 0.0 0.0 - 0.1 x10E3/uL  CMP14+EGFR     Status: Abnormal   Collection Time: 07/05/22  9:23 AM  Result Value Ref Range   Glucose 89 70 - 99 mg/dL    BUN 31 (H) 8 - 27 mg/dL   Creatinine, Ser 9.14 (H) 0.57 - 1.00 mg/dL   eGFR 56 (L) >78 GN/FAO/1.30   BUN/Creatinine Ratio 28 12 - 28   Sodium 144 134 - 144 mmol/L   Potassium 5.3 (H) 3.5 - 5.2 mmol/L   Chloride 106 96 - 106 mmol/L   CO2 25 20 - 29 mmol/L   Calcium 9.7 8.7 - 10.3 mg/dL   Total Protein 6.7 6.0 - 8.5 g/dL   Albumin 4.4 3.8 - 4.9 g/dL   Globulin, Total 2.3 1.5 - 4.5 g/dL   Albumin/Globulin Ratio 1.9 1.2 - 2.2   Bilirubin Total 0.6 0.0 - 1.2 mg/dL   Alkaline Phosphatase 90 44 - 121 IU/L   AST 20 0 - 40 IU/L   ALT 14 0 - 32 IU/L      Assessment & Plan:  Patient advised to continue taking all her medications.  Repeat potassium today. Problem List Items Addressed This Visit     Essential hypertension, benign - Primary   Relevant Orders   Basic metabolic panel   Atrial fibrillation   Mixed hyperlipidemia   Personal history of breast cancer   Relevant Orders   MM 3D SCREENING MAMMOGRAM BILATERAL BREAST   Seasonal allergic rhinitis due to pollen   Other Visit Diagnoses     Screening for colon cancer       Relevant Orders   Ambulatory referral to Gastroenterology       Return in about 3 months (around 10/10/2022).   Total time spent: 30 minutes  Margaretann Loveless, MD  07/11/2022

## 2022-07-12 LAB — BASIC METABOLIC PANEL
BUN/Creatinine Ratio: 20 (ref 12–28)
BUN: 22 mg/dL (ref 8–27)
CO2: 24 mmol/L (ref 20–29)
Calcium: 9.7 mg/dL (ref 8.7–10.3)
Chloride: 106 mmol/L (ref 96–106)
Creatinine, Ser: 1.11 mg/dL — ABNORMAL HIGH (ref 0.57–1.00)
Glucose: 85 mg/dL (ref 70–99)
Potassium: 5.2 mmol/L (ref 3.5–5.2)
Sodium: 144 mmol/L (ref 134–144)
eGFR: 57 mL/min/{1.73_m2} — ABNORMAL LOW (ref >59)

## 2022-07-12 NOTE — Telephone Encounter (Signed)
Spoke with

## 2022-07-29 DIAGNOSIS — M1712 Unilateral primary osteoarthritis, left knee: Secondary | ICD-10-CM | POA: Insufficient documentation

## 2022-07-29 HISTORY — DX: Unilateral primary osteoarthritis, left knee: M17.12

## 2022-07-30 DIAGNOSIS — Z96659 Presence of unspecified artificial knee joint: Secondary | ICD-10-CM

## 2022-07-30 HISTORY — DX: Presence of unspecified artificial knee joint: Z96.659

## 2022-09-25 ENCOUNTER — Ambulatory Visit
Admission: RE | Admit: 2022-09-25 | Discharge: 2022-09-25 | Disposition: A | Discharge status: Home / Self Care | Payer: 59 | Source: Ambulatory Visit | Attending: Internal Medicine | Admitting: Internal Medicine

## 2022-09-25 ENCOUNTER — Other Ambulatory Visit: Payer: Self-pay | Admitting: Cardiovascular Disease

## 2022-09-25 DIAGNOSIS — Z853 Personal history of malignant neoplasm of breast: Secondary | ICD-10-CM | POA: Diagnosis present

## 2022-09-25 DIAGNOSIS — Z1231 Encounter for screening mammogram for malignant neoplasm of breast: Secondary | ICD-10-CM | POA: Diagnosis not present

## 2022-10-10 ENCOUNTER — Ambulatory Visit: Payer: 59 | Admitting: Internal Medicine

## 2022-10-13 ENCOUNTER — Emergency Department: Payer: 59

## 2022-10-13 ENCOUNTER — Observation Stay
Admission: EM | Admit: 2022-10-13 | Discharge: 2022-10-15 | Disposition: A | Discharge status: Home / Self Care | Payer: 59 | Attending: Osteopathic Medicine | Admitting: Osteopathic Medicine

## 2022-10-13 ENCOUNTER — Other Ambulatory Visit: Payer: Self-pay

## 2022-10-13 DIAGNOSIS — Z853 Personal history of malignant neoplasm of breast: Secondary | ICD-10-CM | POA: Diagnosis not present

## 2022-10-13 DIAGNOSIS — Z7901 Long term (current) use of anticoagulants: Secondary | ICD-10-CM | POA: Insufficient documentation

## 2022-10-13 DIAGNOSIS — I10 Essential (primary) hypertension: Secondary | ICD-10-CM | POA: Insufficient documentation

## 2022-10-13 DIAGNOSIS — K859 Acute pancreatitis without necrosis or infection, unspecified: Principal | ICD-10-CM | POA: Insufficient documentation

## 2022-10-13 DIAGNOSIS — E782 Mixed hyperlipidemia: Secondary | ICD-10-CM | POA: Diagnosis present

## 2022-10-13 DIAGNOSIS — I4891 Unspecified atrial fibrillation: Secondary | ICD-10-CM | POA: Diagnosis not present

## 2022-10-13 DIAGNOSIS — N3000 Acute cystitis without hematuria: Secondary | ICD-10-CM

## 2022-10-13 DIAGNOSIS — K858 Other acute pancreatitis without necrosis or infection: Secondary | ICD-10-CM | POA: Diagnosis not present

## 2022-10-13 DIAGNOSIS — K8500 Idiopathic acute pancreatitis without necrosis or infection: Principal | ICD-10-CM

## 2022-10-13 DIAGNOSIS — Z79899 Other long term (current) drug therapy: Secondary | ICD-10-CM | POA: Diagnosis not present

## 2022-10-13 DIAGNOSIS — R17 Unspecified jaundice: Secondary | ICD-10-CM | POA: Diagnosis not present

## 2022-10-13 DIAGNOSIS — R1031 Right lower quadrant pain: Secondary | ICD-10-CM | POA: Diagnosis present

## 2022-10-13 DIAGNOSIS — N390 Urinary tract infection, site not specified: Secondary | ICD-10-CM

## 2022-10-13 LAB — URINALYSIS, ROUTINE W REFLEX MICROSCOPIC
Bilirubin Urine: NEGATIVE
Glucose, UA: NEGATIVE mg/dL
Ketones, ur: 20 mg/dL — AB
Nitrite: POSITIVE — AB
Protein, ur: 100 mg/dL — AB
Specific Gravity, Urine: 1.013 (ref 1.005–1.030)
WBC, UA: >50 WBC/hpf (ref 0–5)
pH: 6 (ref 5.0–8.0)

## 2022-10-13 LAB — CBC
HCT: 41.1 % (ref 36.0–46.0)
Hemoglobin: 14.1 g/dL (ref 12.0–15.0)
MCH: 30.5 pg (ref 26.0–34.0)
MCHC: 34.3 g/dL (ref 30.0–36.0)
MCV: 89 fL (ref 80.0–100.0)
Platelets: 268 10*3/uL (ref 150–400)
RBC: 4.62 MIL/uL (ref 3.87–5.11)
RDW: 12.4 % (ref 11.5–15.5)
WBC: 15.3 10*3/uL — ABNORMAL HIGH (ref 4.0–10.5)
nRBC: 0 % (ref 0.0–0.2)

## 2022-10-13 LAB — TROPONIN I (HIGH SENSITIVITY): Troponin I (High Sensitivity): 9 ng/L (ref <18)

## 2022-10-13 LAB — HEPATIC FUNCTION PANEL
ALT: 13 U/L (ref 0–44)
AST: 17 U/L (ref 15–41)
Albumin: 3.9 g/dL (ref 3.5–5.0)
Alkaline Phosphatase: 90 U/L (ref 38–126)
Bilirubin, Direct: 0.5 mg/dL — ABNORMAL HIGH (ref 0.0–0.2)
Indirect Bilirubin: 1.4 mg/dL — ABNORMAL HIGH (ref 0.3–0.9)
Total Bilirubin: 1.9 mg/dL — ABNORMAL HIGH (ref 0.3–1.2)
Total Protein: 7.6 g/dL (ref 6.5–8.1)

## 2022-10-13 LAB — LIPID PANEL
Cholesterol: 130 mg/dL (ref 0–200)
HDL: 80 mg/dL (ref >40)
LDL Cholesterol: 43 mg/dL (ref 0–99)
Total CHOL/HDL Ratio: 1.6 RATIO
Triglycerides: 36 mg/dL (ref <150)
VLDL: 7 mg/dL (ref 0–40)

## 2022-10-13 LAB — BASIC METABOLIC PANEL
Anion gap: 10 (ref 5–15)
BUN: 15 mg/dL (ref 6–20)
CO2: 23 mmol/L (ref 22–32)
Calcium: 9.3 mg/dL (ref 8.9–10.3)
Chloride: 101 mmol/L (ref 98–111)
Creatinine, Ser: 0.97 mg/dL (ref 0.44–1.00)
GFR, Estimated: >60 mL/min (ref >60)
Glucose, Bld: 110 mg/dL — ABNORMAL HIGH (ref 70–99)
Potassium: 4.2 mmol/L (ref 3.5–5.1)
Sodium: 134 mmol/L — ABNORMAL LOW (ref 135–145)

## 2022-10-13 LAB — LACTIC ACID, PLASMA: Lactic Acid, Venous: 1.1 mmol/L (ref 0.5–1.9)

## 2022-10-13 LAB — LIPASE, BLOOD: Lipase: 51 U/L (ref 11–51)

## 2022-10-13 MED ORDER — HYDROMORPHONE HCL 1 MG/ML IJ SOLN
1.0000 mg | Freq: Once | INTRAMUSCULAR | Status: AC
  Administered 2022-10-13: 1 mg via INTRAVENOUS
  Filled 2022-10-13: qty 1

## 2022-10-13 MED ORDER — ONDANSETRON HCL 4 MG PO TABS: 4.0000 mg | ORAL_TABLET | Freq: Four times a day (QID) | ORAL | Status: DC | PRN

## 2022-10-13 MED ORDER — MORPHINE SULFATE (PF) 4 MG/ML IV SOLN
4.0000 mg | Freq: Once | INTRAVENOUS | Status: AC
  Administered 2022-10-13: 4 mg via INTRAVENOUS
  Filled 2022-10-13: qty 1

## 2022-10-13 MED ORDER — SODIUM CHLORIDE 0.9% FLUSH
3.0000 mL | Freq: Two times a day (BID) | INTRAVENOUS | Status: DC
  Administered 2022-10-13 – 2022-10-15 (×4): 3 mL via INTRAVENOUS

## 2022-10-13 MED ORDER — IOHEXOL 350 MG/ML SOLN
100.0000 mL | Freq: Once | INTRAVENOUS | Status: AC | PRN
  Administered 2022-10-13: 100 mL via INTRAVENOUS

## 2022-10-13 MED ORDER — SODIUM CHLORIDE 0.9 % IV SOLN
1.0000 g | Freq: Once | INTRAVENOUS | Status: AC
  Administered 2022-10-13: 1 g via INTRAVENOUS
  Filled 2022-10-13: qty 10

## 2022-10-13 MED ORDER — HYDROMORPHONE HCL 1 MG/ML IJ SOLN: 0.5000 mg | INTRAMUSCULAR | Status: DC | PRN

## 2022-10-13 MED ORDER — ROSUVASTATIN CALCIUM 10 MG PO TABS
20.0000 mg | ORAL_TABLET | Freq: Every day | ORAL | Status: DC
  Administered 2022-10-14 (×2): 20 mg via ORAL
  Filled 2022-10-13 (×2): qty 2

## 2022-10-13 MED ORDER — OXYCODONE HCL 5 MG PO TABS
5.0000 mg | ORAL_TABLET | ORAL | Status: DC | PRN
  Administered 2022-10-14 – 2022-10-15 (×4): 5 mg via ORAL
  Filled 2022-10-13 (×4): qty 1

## 2022-10-13 MED ORDER — LETROZOLE 2.5 MG PO TABS
2.5000 mg | ORAL_TABLET | Freq: Every day | ORAL | Status: DC
  Administered 2022-10-14 – 2022-10-15 (×2): 2.5 mg via ORAL
  Filled 2022-10-13 (×2): qty 1

## 2022-10-13 MED ORDER — AMLODIPINE BESYLATE 5 MG PO TABS
5.0000 mg | ORAL_TABLET | Freq: Every day | ORAL | Status: DC
  Administered 2022-10-14: 5 mg via ORAL
  Filled 2022-10-13: qty 1

## 2022-10-13 MED ORDER — ONDANSETRON HCL 4 MG/2ML IJ SOLN: 4.0000 mg | Freq: Four times a day (QID) | INTRAMUSCULAR | Status: DC | PRN

## 2022-10-13 MED ORDER — LISINOPRIL 20 MG PO TABS: 20.0000 mg | ORAL_TABLET | Freq: Every day | ORAL | Status: DC

## 2022-10-13 MED ORDER — SODIUM CHLORIDE 0.9 % IV SOLN: INTRAVENOUS | Status: AC

## 2022-10-13 MED ORDER — ONDANSETRON HCL 4 MG/2ML IJ SOLN
4.0000 mg | Freq: Once | INTRAMUSCULAR | Status: AC
  Administered 2022-10-13: 4 mg via INTRAVENOUS
  Filled 2022-10-13: qty 2

## 2022-10-13 MED ORDER — RIVAROXABAN 20 MG PO TABS
20.0000 mg | ORAL_TABLET | Freq: Every day | ORAL | Status: DC
  Administered 2022-10-14 – 2022-10-15 (×2): 20 mg via ORAL
  Filled 2022-10-13 (×2): qty 1

## 2022-10-13 MED ORDER — SODIUM CHLORIDE 0.9 % IV BOLUS
1000.0000 mL | Freq: Once | INTRAVENOUS | Status: AC
  Administered 2022-10-13: 1000 mL via INTRAVENOUS

## 2022-10-13 MED ORDER — AMIODARONE HCL 200 MG PO TABS
200.0000 mg | ORAL_TABLET | Freq: Every day | ORAL | Status: DC
  Administered 2022-10-14 – 2022-10-15 (×2): 200 mg via ORAL
  Filled 2022-10-13 (×2): qty 1

## 2022-10-13 NOTE — H&P (Signed)
History and Physical    TAILA MCALHANY ZOX:[id number] DOB: 1960-10-01 DOA: 10/13/2022  PCP: Margaretann Loveless, MD  Patient coming from: Home  Chief Complaint: flank and abdominal pain.   HPI: Heather Conrad is a 62 y.o. female with medical history significant of anxiety, Afib, h/o breast cancer, HLD, HTN, lumbar stenosis, nephrolithiasis who presented for flank and abdominal pain.  Ms. Schultheiss reports that about 3-4 days ago, she started to develop some back pain which radiated around to her right side.  The pain was sharp and 8/10 at its worst.  She noted that the pain moved over time to her RLQ and became achy and dull.  She has had kidney stones before and she thought this was what she had.  She had no dysuria or noticeable blood in the urine.  She came in today because the pain became unbearable.  She had tried advil over the counter with no help.  Further symptoms include loss of appetite, nausea and dry heaving along with chills.  She tried a heating pad which only helped a little.  She went for CT scan of the abdomen and it showed pancreatitis, lipase was normal.  After coming back from the CT scan, she acutely developed epigastric pain which was improved/resolved with dilaudid.  She further feels like her mouth is dry. She has no history of alcohol use, her gallbladder was removed in 1992.  She is on a statin, but is not aware if she or her family has a history of hypertriglyceridemia.  She has had no recent medication changes.   ED Course: In the ED, as noted, she was found to have pancreatitis on imaging.  UA showed findings concerning for UTI as well.  She was given fluids, pain control, rocephin for the UTI.  Cultures of the blood and urine were sent.  She was found to have mildly elevated bilirubin, indirect higher than direct, WBC was 15.3.  Review of Systems: As per HPI otherwise all other systems reviewed and are negative.   Past Medical History:  Diagnosis Date   Acute renal failure  (ARF) (HCC) 01/2012   Anxiety    Arthritis 2003   SPINE AND RIGHT KNEE   Atrial fibrillation (HCC)    Breast cancer (HCC)    Breast cancer in female (HCC) 07/16/2017   Right-invasive mammary cancer   Cancer (HCC)    Cough    Dry skin    Dyspnea    Dysrhythmia 01/2017   atrial fibrillation   Fatigue    Heart murmur    per pt report   History of kidney stones    History of MRSA infection 2013   leg, abdomen, arm   HPV in female    Hyperlipidemia 2018   Hypertension    Kidney stone 01/2017   Leg cramping    Leg pain    Lumbar stenosis    Mixed hyperlipidemia    Nervousness    Obesity 01/2017   Palpitations    Personal history of radiation therapy    Pyelonephritis    Shortness of breath on exertion    Stress    Swelling of both lower extremities    UTI (urinary tract infection)    Vitamin D deficiency    Weakness     Past Surgical History:  Procedure Laterality Date   BREAST BIOPSY Right 07/16/2017   Invasive mammary carcinoma   BREAST LUMPECTOMY Right 09/01/2017   F/U radation, clear margins   BREAST LUMPECTOMY  WITH RADIOACTIVE SEED AND SENTINEL LYMPH NODE BIOPSY Right 09/01/2017   Procedure: BREAST LUMPECTOMY WITH RADIOACTIVE SEED AND SENTINEL LYMPH NODE BIOPSY;  Surgeon: Ovidio Kin, MD;  Location: [facility] OR;  Service: General;  Laterality: Right;   CARDIOVERSION Right 07/27/2020   Procedure: CARDIOVERSION;  Surgeon: Laurier Nancy, MD;  Location: [facility] ORS;  Service: Cardiovascular;  Laterality: Right;   CHOLECYSTECTOMY     1992   DILATION AND CURETTAGE OF UTERUS     EXTRACORPOREAL SHOCK WAVE LITHOTRIPSY Left 02/13/2017   Procedure: EXTRACORPOREAL SHOCK WAVE LITHOTRIPSY (ESWL);  Surgeon: Orson Ape, MD;  Location: [facility] ORS;  Service: Urology;  Laterality: Left;   FOOT FRACTURE SURGERY Right 2018   repair of trigonem of ankle. endoscopic fasciotomy   INDUCED ABORTION     KNEE SURGERY Right    knee scope for torn meniscus   LUMBAR  LAMINECTOMY/DECOMPRESSION MICRODISCECTOMY Bilateral 10/15/2017   Procedure: Laminectomy and Foraminotomy - Lumbar one-two, Lumbar two-three, Lumbar three-four  - bilateral;  Surgeon: Tia Alert, MD;  Location: [facility] OR;  Service: Neurosurgery;  Laterality: Bilateral;    Social History  reports that she has never smoked. She has never used smokeless tobacco. She reports current alcohol use. She reports that she does not use drugs.  No Known Allergies  Family History  Problem Relation Age of Onset   Heart disease Father        deceased 86; had 15 siblings   Hypertension Father    Kidney disease Father    Colon cancer Paternal Uncle 14       deceased 29s   Hypertension Paternal Uncle    Diabetes Maternal Aunt    Hypertension Maternal Aunt    Heart disease Mother        deceased at 26; had 7 siblings   Hypertension Mother    Hyperlipidemia Mother    Depression Mother    Anxiety disorder Mother    Sleep apnea Mother    Obesity Mother    Uterine cancer Sister 47       currently 7; no children   Breast cancer Cousin 55       currently 39; daughter of unaffected maternal aunt    Prior to Admission medications   Medication Sig Start Date End Date Taking? Authorizing Provider  amiodarone (PACERONE) 200 MG tablet TAKE 1 TABLET BY MOUTH EVERY DAY 09/26/22   Adrian Blackwater A, MD  amLODipine (NORVASC) 5 MG tablet Take 1 tablet (5 mg total) by mouth daily. 04/29/22 04/29/23  Margaretann Loveless, MD  Biotin 78295 MCG TABS Take 10,000 mcg by mouth in the morning.    [provider]  fluticasone (FLONASE) 50 MCG/ACT nasal spray Place 1 spray into both nostrils daily. 04/29/22   Margaretann Loveless, MD  letrozole [facility]) 2.5 MG tablet TAKE 1 TABLET(2.5 MG) BY MOUTH DAILY 04/03/22   Rickard Patience, MD  lisinopril (ZESTRIL) 20 MG tablet Take 20 mg by mouth daily. 11/16/21   [provider]  loratadine (CLARITIN) 10 MG tablet Take 1 tablet (10 mg total) by mouth daily for 60 doses. 04/29/22 06/28/22   Margaretann Loveless, MD  meclizine (ANTIVERT) 25 MG tablet Take 1 tablet (25 mg total) by mouth 3 (three) times daily as needed for dizziness. 04/29/22   Margaretann Loveless, MD  Multiple Vitamin (MULTIVITAMIN WITH MINERALS) TABS tablet Take 1 tablet by mouth daily. Centrum Silver for Women    [provider]  rosuvastatin (CRESTOR) 20 MG tablet Take  20 mg by mouth at bedtime. 06/14/21   [provider]  XARELTO 20 MG TABS tablet Take 20 mg by mouth in the morning. 10/21/17   [provider]    Physical Exam: Vitals:   10/13/22 1727 10/13/22 2130 10/13/22 2211 10/13/22 2214  BP:  (!) 123/58 106/72   Pulse:  62 62   Resp:  (!) 26 18   Temp: 98.3 F (36.8 C)   98.2 F (36.8 C)  TempSrc: Oral   Oral  SpO2:  100% 97%   Weight:      Height:        Constitutional: NAD, calm, appears uncomfortable in bed.  Eyes: She has yellow sclerae, which is not normal for her, and some conjunctival injection ENMT: Mucous membranes are dry, she has mild yellowing under the tongue Neck: normal, supple Respiratory: breathing comfortably on room air, no distress, no wheezing Cardiovascular: RR, NR, no murmur, she has intact pulses in the radial and DP bilaterally Abdomen: + TTP in the epigastrium, RUQ and RLQ.  No distention, somewhat decreased bowel sounds.  Musculoskeletal: normal tone and bulk for age Skin: She has chronic skin changes in the legs with spider veins, she has tanned skin Neurologic: Grossly intact, ambulatory in the ED, speech is normal.  Psychiatric: Normal judgment and insight. Alert and oriented x 3. Normal mood.    Labs on Admission: I have personally reviewed following labs and imaging studies  CBC: Recent Labs  Lab 10/13/22 1727  WBC 15.3*  HGB 14.1  HCT 41.1  MCV 89.0  PLT 268    Basic Metabolic Panel: Recent Labs  Lab 10/13/22 1727  NA 134*  K 4.2  CL 101  CO2 23  GLUCOSE 110*  BUN 15  CREATININE 0.97  CALCIUM 9.3    GFR: Estimated  Creatinine Clearance: 83.6 mL/min (by C-G formula based on SCr of 0.97 mg/dL).  Liver Function Tests: Recent Labs  Lab 10/13/22 1727  AST 17  ALT 13  ALKPHOS 90  BILITOT 1.9*  PROT 7.6  ALBUMIN 3.9    Urine analysis:    Component Value Date/Time   COLORURINE YELLOW (A) 10/13/2022 1727   APPEARANCEUR HAZY (A) 10/13/2022 1727   APPEARANCEUR Clear 05/04/2011 2242   LABSPEC 1.013 10/13/2022 1727   LABSPEC 1.014 05/04/2011 2242   PHURINE 6.0 10/13/2022 1727   GLUCOSEU NEGATIVE 10/13/2022 1727   GLUCOSEU Negative 05/04/2011 2242   HGBUR SMALL (A) 10/13/2022 1727   BILIRUBINUR NEGATIVE 10/13/2022 1727   BILIRUBINUR Negative 05/04/2011 2242   KETONESUR 20 (A) 10/13/2022 1727   PROTEINUR 100 (A) 10/13/2022 1727   UROBILINOGEN 1.0 05/07/2011 0557   NITRITE POSITIVE (A) 10/13/2022 1727   LEUKOCYTESUR SMALL (A) 10/13/2022 1727   LEUKOCYTESUR Negative 05/04/2011 2242    Radiological Exams on Admission: CT ABDOMEN PELVIS W CONTRAST  Result Date: 10/13/2022 CLINICAL DATA:  Right flank pain radiating to right lower abdomen, nausea and vomiting EXAM: CT ABDOMEN AND PELVIS WITH CONTRAST TECHNIQUE: Multidetector CT imaging of the abdomen and pelvis was performed using the standard protocol following bolus administration of intravenous contrast. RADIATION DOSE REDUCTION: This exam was performed according to the departmental dose-optimization program which includes automated exposure control, adjustment of the mA and/or kV according to patient size and/or use of iterative reconstruction technique. CONTRAST:  OMNIPAQUE IOHEXOL 350 MG/ML SOLN COMPARISON:  06/02/2017 FINDINGS: Lower chest: No acute pleural or parenchymal lung disease. Cardiomegaly without pericardial effusion. Hepatobiliary: No focal liver abnormality is seen. Status post  cholecystectomy. No biliary dilatation. Pancreas: There is marked fat stranding surrounding the uncinate process and head of the pancreas, consistent with  acute pancreatitis. No fluid collection, pseudocyst, or abscess. No pancreatic duct dilation. Spleen: Normal in size without focal abnormality. Adrenals/Urinary Tract: 1.5 cm right adrenal nodule and 1.0 cm left adrenal nodule unchanged since prior study, most consistent with adenomas given long-term stability. Kidneys enhance normally. Punctate less than 2 mm nonobstructing left renal calculus. No obstructive uropathy within either kidney. The bladder is minimally distended, limiting its evaluation. Stomach/Bowel: No bowel obstruction or ileus. Normal appendix right lower quadrant. Previous bariatric surgery. Small hiatal hernia. No bowel wall thickening. Vascular/Lymphatic: No significant vascular findings are present. No enlarged abdominal or pelvic lymph nodes. Reproductive: Uterus and bilateral adnexa are unremarkable. Other: No free fluid or free intraperitoneal gas. Small fat containing umbilical hernia. Musculoskeletal: No acute or destructive bony abnormalities. Reconstructed images demonstrate no additional findings. IMPRESSION: 1. Acute uncomplicated pancreatitis. No fluid collection, pseudocyst, or abscess. 2. Punctate less than 2 mm nonobstructing left renal calculus. 3. Stable cardiomegaly. Electronically Signed   By: Sharlet Salina M.D.   On: 10/13/2022 20:41    EKG: Independently reviewed. NSR, T wave inversion in V1 and V2 which are new from 2 years ago, nonspecific  Assessment/Plan  Acute pancreatitis Hyperbilirubinemia, unconjugated - Presentation is odd, given symptoms and normal lipase - For the pancreatitis, will plan to treat with IVF and pain control along with Zofran for nausea control - Based on brief literature review, bilirubin can rise in acute pancreatitis due to transient blockage of liver flow into the GI tract, would need to recheck 10 days after symptoms resolve - For completeness, will get a blood smear and a retic count - She does not drink ETOH and has her gallbladder  out - Will plan to check lipid panel for TG  Lower urinary tract infectious disease - Relatively asymptomatic, but with abdominal pain, abnormal UA and elevated WBC.  She received 1 dose of rocephin in the ED.  - UC and BC are pending - Monitor for fever - Continue rocephin for 3 days or until cultures are negative.     Essential hypertension, benign - Continue home amlodipine, hold for low blood pressures - Hold lisinopril for now    Atrial fibrillation (HCC) - continue amiodarone, xarelto    Mixed hyperlipidemia - Continue statin    Personal history of breast cancer - Continue letrazole    DVT prophylaxis: Xarelto  Code Status:   Full  Disposition Plan:   Patient is from:  Home  Anticipated DC to:  [facility]  Anticipated DC date:  10/14/22  Anticipated DC barriers: INability to eat, further work up  [place] called:  None  Admission status:  Obs, Med surg   Severity of Illness: The appropriate patient status for this patient is OBSERVATION. Observation status is judged to be reasonable and necessary in order to provide the required intensity of service to ensure the patient's safety. The patient's presenting symptoms, physical exam findings, and initial radiographic and laboratory data in the context of their medical condition is felt to place them at decreased risk for further clinical deterioration. Furthermore, it is anticipated that the patient will be medically stable for discharge from the hospital within 2 midnights of admission.     Debe Coder MD [facility]  How to contact the [facility] Attending or Consulting provider 7A - 7P or covering provider during after hours 7P -7A, for this patient?  Check the care team in [facility] and look for a) attending/consulting [facility] provider listed and b) the [facility] team listed Log into [email or web address] and use [facility]'s universal password to access. If you do not have the password, please contact the hospital operator. Locate the [facility]  provider you are looking for under [facility] and page to a number that you can be directly reached. If you still have difficulty reaching the provider, please page the [facility] (Director on Call) for the Hospitalists listed on amion for assistance.  10/13/2022, 10:43 PM

## 2022-10-13 NOTE — ED Triage Notes (Signed)
Pt to ED for R flank pain from back to R lower abdomen since Thursday (3d). Pain is sharp at times and also dull and aching. Has appendix, no GB. Pt is nauseous, has vomited several times, today only dry heaving. No urinary pain/burning or hematuria. Hx renal stones 4 times. Pt in NAD.

## 2022-10-13 NOTE — ED Provider Notes (Cosign Needed Addendum)
[facility] Emergency Department Provider Note     Event Date/Time   First MD Initiated Contact with Patient 10/13/22 1832     (approximate)   History   Flank Pain   HPI  Heather Conrad is a 62 y.o. female with a history of nephrolithiasis, HTN, A-fib, and morbid obesity, who presents to the ED for evaluation of 3 days of right flank pain with referral to the right lower abdomen.  Patient would endorse intermittent sharp pain to the right flank and abdomen since Thursday.  She also endorses some intermittent dull and achy pain.  Patient has endorsed nausea and nonbloody, nonbilious vomitus several times.  Today she has had decreased p.o. intake and only endorses dry heaving.  Patient denies any frank fevers but does endorse subjective fevers and chills.  She denies any urinary symptoms but does endorse some oliguria today.  She denies any gross hematuria or abnormal vaginal discharge.  Patient surgical history includes bariatric surgery and a cholecystectomy in 1992.  She gives a remote history of renal stones treated with lithotripsy some 10 years prior.  Physical Exam   Triage Vital Signs: ED Triage Vitals  Encounter Vitals Group     BP 10/13/22 1724 (!) 102/58     Systolic BP Percentile --      Diastolic BP Percentile --      Pulse Rate 10/13/22 1724 63     Resp 10/13/22 1724 16     Temp 10/13/22 1727 98.3 F (36.8 C)     Temp Source 10/13/22 1727 Oral     SpO2 10/13/22 1724 98 %     Weight 10/13/22 1725 300 lb (136.1 kg)     Height 10/13/22 1725 5\' 3"  (1.6 m)     Head Circumference --      Peak Flow --      Pain Score 10/13/22 1723 7     Pain Loc --      Pain Education --      Exclude from Growth Chart --     Most recent vital signs: Vitals:   10/13/22 1727 10/13/22 2130  BP:  (!) 123/58  Pulse:  62  Resp:  (!) 26  Temp: 98.3 F (36.8 C)   SpO2:  100%    General Awake, no distress.  NAD CV:  Good peripheral perfusion.  RRR RESP:  Normal effort. CTA ABD:  No distention.  Nontender.  Normoactive bowel sounds x 4.  Tender to palpation to the right flank and right lower abdominal region. GU:  deferred   ED Results / Procedures / Treatments   Labs (all labs ordered are listed, but only abnormal results are displayed) Labs Reviewed  URINALYSIS, ROUTINE W REFLEX MICROSCOPIC - Abnormal; Notable for the following components:      Result Value   Color, Urine YELLOW (*)    APPearance HAZY (*)    Hgb urine dipstick SMALL (*)    Ketones, ur 20 (*)    Protein, ur 100 (*)    Nitrite POSITIVE (*)    Leukocytes,Ua SMALL (*)    Bacteria, UA RARE (*)    All other components within normal limits  BASIC METABOLIC PANEL - Abnormal; Notable for the following components:   Sodium 134 (*)    Glucose, Bld 110 (*)    All other components within normal limits  CBC - Abnormal; Notable for the following components:   WBC 15.3 (*)    All other components  within normal limits  HEPATIC FUNCTION PANEL - Abnormal; Notable for the following components:   Total Bilirubin 1.9 (*)    Bilirubin, Direct 0.5 (*)    Indirect Bilirubin 1.4 (*)    All other components within normal limits  URINE CULTURE  CULTURE, BLOOD (ROUTINE X 2)  CULTURE, BLOOD (ROUTINE X 2)  LIPASE, BLOOD  LACTIC ACID, PLASMA  LACTIC ACID, PLASMA  TROPONIN I (HIGH SENSITIVITY)     EKG  Vent. rate 63 BPM PR interval 162 ms QRS duration 92 ms QT/QTcB 424/433 ms P-R-T axes 63 -59 90 Normal sinus rhythm Left axis deviation Possible Anterior infarct , age undetermined Abnormal ECG No STEMI  RADIOLOGY  I personally viewed and evaluated these images as part of my medical decision making, as well as reviewing the written report by the radiologist.  ED Provider Interpretation: acute pancreatitis. Non-obstructing left renal stone  CT ABDOMEN PELVIS W CONTRAST  Result Date: 10/13/2022 CLINICAL DATA:  Right flank pain radiating to right lower abdomen,  nausea and vomiting EXAM: CT ABDOMEN AND PELVIS WITH CONTRAST TECHNIQUE: Multidetector CT imaging of the abdomen and pelvis was performed using the standard protocol following bolus administration of intravenous contrast. RADIATION DOSE REDUCTION: This exam was performed according to the departmental dose-optimization program which includes automated exposure control, adjustment of the mA and/or kV according to patient size and/or use of iterative reconstruction technique. CONTRAST:  OMNIPAQUE IOHEXOL 350 MG/ML SOLN COMPARISON:  06/02/2017 FINDINGS: Lower chest: No acute pleural or parenchymal lung disease. Cardiomegaly without pericardial effusion. Hepatobiliary: No focal liver abnormality is seen. Status post cholecystectomy. No biliary dilatation. Pancreas: There is marked fat stranding surrounding the uncinate process and head of the pancreas, consistent with acute pancreatitis. No fluid collection, pseudocyst, or abscess. No pancreatic duct dilation. Spleen: Normal in size without focal abnormality. Adrenals/Urinary Tract: 1.5 cm right adrenal nodule and 1.0 cm left adrenal nodule unchanged since prior study, most consistent with adenomas given long-term stability. Kidneys enhance normally. Punctate less than 2 mm nonobstructing left renal calculus. No obstructive uropathy within either kidney. The bladder is minimally distended, limiting its evaluation. Stomach/Bowel: No bowel obstruction or ileus. Normal appendix right lower quadrant. Previous bariatric surgery. Small hiatal hernia. No bowel wall thickening. Vascular/Lymphatic: No significant vascular findings are present. No enlarged abdominal or pelvic lymph nodes. Reproductive: Uterus and bilateral adnexa are unremarkable. Other: No free fluid or free intraperitoneal gas. Small fat containing umbilical hernia. Musculoskeletal: No acute or destructive bony abnormalities. Reconstructed images demonstrate no additional findings. IMPRESSION: 1. Acute  uncomplicated pancreatitis. No fluid collection, pseudocyst, or abscess. 2. Punctate less than 2 mm nonobstructing left renal calculus. 3. Stable cardiomegaly. Electronically Signed   By: Sharlet Salina M.D.   On: 10/13/2022 20:41     PROCEDURES:  Critical Care performed: No  Procedures   MEDICATIONS ORDERED IN ED: Medications  sodium chloride 0.9 % bolus 1,000 mL (0 mLs Intravenous Stopped 10/13/22 2141)  ondansetron (ZOFRAN) injection 4 mg (4 mg Intravenous Given 10/13/22 2008)  morphine (PF) 4 MG/ML injection 4 mg (4 mg Intravenous Given 10/13/22 2008)  iohexol (OMNIPAQUE) 350 MG/ML injection 100 mL (100 mLs Intravenous Contrast Given 10/13/22 2025)  HYDROmorphone (DILAUDID) injection 1 mg (1 mg Intravenous Given 10/13/22 2130)  cefTRIAXone (ROCEPHIN) 1 g in sodium chloride 0.9 % 100 mL IVPB ( Intravenous Infusion Verify 10/13/22 2142)     IMPRESSION / MDM / ASSESSMENT AND PLAN / ED COURSE  I reviewed the triage vital signs and the nursing  notes.                              Differential diagnosis includes, but is not limited to, ovarian cyst, ovarian torsion, acute appendicitis, diverticulitis, urinary tract infection/pyelonephritis, endometriosis, bowel obstruction, colitis, renal colic, gastroenteritis, hernia, fibroids, endometriosis, pregnancy related pain including ectopic pregnancy, etc.  ----------------------------------------- 9:04 PM on 10/13/2022 ----------------------------------------- From evaluation and update the patient, noting CT results.  Patient was subsequently endorsing epigastric abdominal discomfort when she returned to her room from the CT suite. CT reveals acute pancreatitis without evidence of significant nephrolithiasis, hydronephrosis, or pyelonephritis.  Patient's presentation is most consistent with acute complicated illness / injury requiring diagnostic workup.  Patient's diagnosis is consistent with acute pancreatitis as confirmed by CT scan.   Patient presents to the ED with subjective fevers, chills, nausea, vomiting, right flank pain as well as anorexia.  She was found to have an elevated white count at greater than 15 and a urinalysis that does reveal nitrite positive urine with some hyaline cast.  Interestingly enough the CT scan does not reveal any significant right-sided hydronephrosis, renal calculi, or pyelonephritis.  Patient did began to endorse epigastric abdominal pain following CT imaging.  She has been given IV pain medicine in the form of morphine and Dilaudid as well as Zofran with some improvement of her symptoms.  With patient's presentation with acute pancreatitis and elevated white count, I discussed with the patient the benefit of admission at this time to manage her pain as well as acute infectious process.  Patient is agreeable to the plan at this time and will be admitted to the hospital service for further evaluation management.   ----------------------------------------- 10:07 PM on 10/13/2022 ----------------------------------------- S/W Dr. Inetta Fermo: she will admit the patient to the hospitalist service as discussed.   Clinical Course as of 10/13/22 2203  Wynelle Link Oct 13, 2022  2102 Hepatic function panel [JM]    Clinical Course User Index [JM] Anielle Headrick, Charlesetta Ivory, PA-C    FINAL CLINICAL IMPRESSION(S) / ED DIAGNOSES   Final diagnoses:  Idiopathic acute pancreatitis without infection or necrosis  Acute cystitis without hematuria     Rx / DC Orders   ED Discharge Orders     None        Note:  This document was prepared using Dragon voice recognition software and may include unintentional dictation errors.    Lissa Hoard, PA-C 10/13/22 2203    Lissa Hoard, PA-C 10/13/22 2207    Corena Herter, MD 10/15/22 1329

## 2022-10-14 DIAGNOSIS — K858 Other acute pancreatitis without necrosis or infection: Secondary | ICD-10-CM

## 2022-10-14 LAB — DIFFERENTIAL
Abs Immature Granulocytes: 0.08 10*3/uL — ABNORMAL HIGH (ref 0.00–0.07)
Basophils Absolute: 0 10*3/uL (ref 0.0–0.1)
Basophils Relative: 0 %
Eosinophils Absolute: 0 10*3/uL (ref 0.0–0.5)
Eosinophils Relative: 0 %
Immature Granulocytes: 1 %
Lymphocytes Relative: 10 %
Lymphs Abs: 1.1 10*3/uL (ref 0.7–4.0)
Monocytes Absolute: 1 10*3/uL (ref 0.1–1.0)
Monocytes Relative: 9 %
Neutro Abs: 9.5 10*3/uL — ABNORMAL HIGH (ref 1.7–7.7)
Neutrophils Relative %: 80 %

## 2022-10-14 LAB — TECHNOLOGIST SMEAR REVIEW: Plt Morphology: NORMAL

## 2022-10-14 LAB — RETICULOCYTES
Immature Retic Fract: 18 % — ABNORMAL HIGH (ref 2.3–15.9)
RBC.: 3.99 MIL/uL (ref 3.87–5.11)
Retic Count, Absolute: 68.6 10*3/uL (ref 19.0–186.0)
Retic Ct Pct: 1.7 % (ref 0.4–3.1)

## 2022-10-14 MED ORDER — SODIUM CHLORIDE 0.9 % IV SOLN: INTRAVENOUS | Status: AC

## 2022-10-14 MED ORDER — SODIUM CHLORIDE 0.9 % IV SOLN
1.0000 g | INTRAVENOUS | Status: DC
  Administered 2022-10-14: 1 g via INTRAVENOUS
  Filled 2022-10-14: qty 10

## 2022-10-14 NOTE — Plan of Care (Signed)

## 2022-10-14 NOTE — Progress Notes (Signed)
PROGRESS NOTE    [name]   GNF:[id number] DOB: 1961-01-21  DOA: 10/13/2022 Date of Service: 10/14/22 PCP: Margaretann Loveless, MD     Brief Narrative / Hospital Course:   Heather Conrad is a 62 y.o. female with medical history significant of anxiety, Afib (on Xarelto), h/o breast cancer, HLD, HTN, lumbar stenosis, nephrolithiasis who presented for flank and abdominal pain. Back pain 3-4 days prior, migrated to RLQ, assoc w/ anorexia, nausea, chills.   07/28: CT Abd/Pelv (+)pancreatitis, (+)UA concern for UTI, leukocytosis, hyperbilirubinemia. Admitted to hospitalist service.  07/29: WBC improved but still above normal, (+)neuts, mild increased retic, no hyper-TG. BP low, minimal po intake, will resume IV fluids and hold BP meds   Consultants:  none  Procedures: none      ASSESSMENT & PLAN:  Acute pancreatitis Presentation is odd, given symptoms and normal lipase No EtOH, r/o high TG, s/p cholecystectomy IVF  pain control  Zofran prn nausea   Hyperbilirubinemia, unconjugated bilirubin can rise in acute pancreatitis due to transient blockage of liver flow into the GI tract would need to recheck 10 days after symptoms resolve for completeness, will get a blood smear and a retic count --> borderline high, no concern for hemolysis   Lower urinary tract infectious disease Relatively asymptomatic, but with abdominal pain, abnormal UA and elevated WBC will treat UC and BC are pending Monitor for fever Continue rocephin pending cultures     Essential hypertension, benign Hold amlodipine and lisinopril for now    Atrial fibrillation (HCC) continue amiodarone, xarelto    Mixed hyperlipidemia Continue statin    Personal history of breast cancer Continue letrazole     DVT prophylaxis: Xarelto (for Afib)  Pertinent IV fluids/nutrition: NS 125 mL/h, can start CLD as tolerated  Central lines / invasive devices: none  Code Status: FULL CODE ACP documentation  reviewed: 10/14/22 none on file [facility]  Current Admission Status: observation may need to adjust to inpatient  TOC needs / Dispo plan: none at this time  Barriers to discharge / significant pending items: clinical improvement, culture results, still on IV therapy, expect will be here another 1-2 days              Subjective / Brief ROS:  Patient reports persistent abdominal pain, bit better form yesterday, no nausea  Denies CP/SOB.  Denies new weakness.  Tolerating diet w/ CLD  Reports no concerns w/ urination/defecation.   Family Communication: none at this time     Objective Findings:  Vitals:   10/14/22 0001 10/14/22 0043 10/14/22 0851 10/14/22 1510  BP: 109/61 (!) 106/46 (!) 108/54 (!) 96/56  Pulse: 60 (!) 54 (!) 53 (!) 51  Resp: 18 16 17 17   Temp:  98.4 F (36.9 C) 97.9 F (36.6 C) 98.2 F (36.8 C)  TempSrc:  Oral    SpO2: 99% 91% 95% 96%  Weight:      Height:        Intake/Output Summary (Last 24 hours) at 10/14/2022 1724 Last data filed at 10/14/2022 1421 Gross per 24 hour  Intake 932.55 ml  Output --  Net 932.55 ml   Filed Weights   10/13/22 1725  Weight: 136.1 kg    Examination:  Physical Exam Constitutional:      General: She is not in acute distress. Cardiovascular:     Rate and Rhythm: Normal rate and regular rhythm.  Pulmonary:     Effort: Pulmonary effort is normal.     Breath  sounds: Normal breath sounds.  Abdominal:     General: Abdomen is flat.     Palpations: Abdomen is soft.     Tenderness: There is abdominal tenderness (epigastric).  Skin:    General: Skin is warm and dry.  Neurological:     General: No focal deficit present.     Mental Status: She is alert and oriented to person, place, and time.  Psychiatric:        Mood and Affect: Mood normal.        Behavior: Behavior normal.          Scheduled Medications:   amiodarone  200 mg Oral Daily   letrozole  2.5 mg Oral Daily   rivaroxaban  20 mg Oral Q breakfast    rosuvastatin  20 mg Oral QHS   sodium chloride flush  3 mL Intravenous Q12H    Continuous Infusions:  sodium chloride     cefTRIAXone (ROCEPHIN)  IV 1 g (10/14/22 1600)    PRN Medications:  HYDROmorphone (DILAUDID) injection, ondansetron **OR** ondansetron (ZOFRAN) IV, oxyCODONE  Antimicrobials from admission:  Anti-infectives (From admission, onward)    Start     Dose/Rate Route Frequency Ordered Stop   10/14/22 1600  cefTRIAXone (ROCEPHIN) 1 g in sodium chloride 0.9 % 100 mL IVPB        1 g 200 mL/hr over 30 Minutes Intravenous Every 24 hours 10/14/22 0329     10/13/22 2130  cefTRIAXone (ROCEPHIN) 1 g in sodium chloride 0.9 % 100 mL IVPB        1 g 200 mL/hr over 30 Minutes Intravenous  Once 10/13/22 2120 10/13/22 2211           Data Reviewed:  I have personally reviewed the following...  CBC: Recent Labs  Lab 10/13/22 1727 10/14/22 0429  WBC 15.3* 11.7*  NEUTROABS  --  9.5*  HGB 14.1 12.4  HCT 41.1 35.6*  MCV 89.0 89.7  PLT 268 192   Basic Metabolic Panel: Recent Labs  Lab 10/13/22 1727 10/14/22 0429  NA 134* 136  K 4.2 3.6  CL 101 105  CO2 23 23  GLUCOSE 110* 95  BUN 15 14  CREATININE 0.97 0.95  CALCIUM 9.3 8.6*   GFR: Estimated Creatinine Clearance: 85.4 mL/min (by C-G formula based on SCr of 0.95 mg/dL). Liver Function Tests: Recent Labs  Lab 10/13/22 1727  AST 17  ALT 13  ALKPHOS 90  BILITOT 1.9*  PROT 7.6  ALBUMIN 3.9   Recent Labs  Lab 10/13/22 1727  LIPASE 51   No results for input(s): "AMMONIA" in the last 168 hours. Coagulation Profile: No results for input(s): "INR", "PROTIME" in the last 168 hours. Cardiac Enzymes: No results for input(s): "CKTOTAL", "CKMB", "CKMBINDEX", "TROPONINI" in the last 168 hours. BNP (last 3 results) No results for input(s): "PROBNP" in the last 8760 hours. HbA1C: No results for input(s): "HGBA1C" in the last 72 hours. CBG: No results for input(s): "GLUCAP" in the last 168 hours. Lipid  Profile: Recent Labs    10/13/22 2102  CHOL 130  HDL 80  LDLCALC 43  TRIG 36  CHOLHDL 1.6   Thyroid Function Tests: No results for input(s): "TSH", "T4TOTAL", "FREET4", "T3FREE", "THYROIDAB" in the last 72 hours. Anemia Panel: Recent Labs    10/14/22 0429  RETICCTPCT 1.7   Most Recent Urinalysis On File:     Component Value Date/Time   COLORURINE YELLOW (A) 10/13/2022 1727   APPEARANCEUR HAZY (A) 10/13/2022 1727  APPEARANCEUR Clear 05/04/2011 2242   LABSPEC 1.013 10/13/2022 1727   LABSPEC 1.014 05/04/2011 2242   PHURINE 6.0 10/13/2022 1727   GLUCOSEU NEGATIVE 10/13/2022 1727   GLUCOSEU Negative 05/04/2011 2242   HGBUR SMALL (A) 10/13/2022 1727   BILIRUBINUR NEGATIVE 10/13/2022 1727   BILIRUBINUR Negative 05/04/2011 2242   KETONESUR 20 (A) 10/13/2022 1727   PROTEINUR 100 (A) 10/13/2022 1727   UROBILINOGEN 1.0 05/07/2011 0557   NITRITE POSITIVE (A) 10/13/2022 1727   LEUKOCYTESUR SMALL (A) 10/13/2022 1727   LEUKOCYTESUR Negative 05/04/2011 2242   Sepsis Labs: @LABRCNTIP (procalcitonin:4,lacticidven:4) Microbiology: Recent Results (from the past 240 hour(s))  Culture, blood (routine x 2)     Status: None (Preliminary result)   Collection Time: 10/13/22  9:36 PM   Specimen: BLOOD RIGHT ARM  Result Value Ref Range Status   Specimen Description BLOOD RIGHT ARM  Final   Special Requests   Final    BOTTLES DRAWN AEROBIC AND ANAEROBIC Blood Culture results may not be optimal due to an inadequate volume of blood received in culture bottles   Culture   Final    NO GROWTH < 12 HOURS Performed at [facility], [address] [place], [place] [address]    Report Status PENDING  Incomplete  Culture, blood (routine x 2)     Status: None (Preliminary result)   Collection Time: 10/13/22  9:36 PM   Specimen: BLOOD RIGHT ARM  Result Value Ref Range Status   Specimen Description BLOOD RIGHT ARM  Final   Special Requests   Final    BOTTLES DRAWN AEROBIC AND ANAEROBIC  Blood Culture results may not be optimal due to an inadequate volume of blood received in culture bottles   Culture   Final    NO GROWTH < 12 HOURS Performed at [facility], [address] [place], [place] [address]    Report Status PENDING  Incomplete      Radiology Studies last 3 days: CT ABDOMEN PELVIS W CONTRAST  Result Date: 10/13/2022 CLINICAL DATA:  Right flank pain radiating to right lower abdomen, nausea and vomiting EXAM: CT ABDOMEN AND PELVIS WITH CONTRAST TECHNIQUE: Multidetector CT imaging of the abdomen and pelvis was performed using the standard protocol following bolus administration of intravenous contrast. RADIATION DOSE REDUCTION: This exam was performed according to the departmental dose-optimization program which includes automated exposure control, adjustment of the mA and/or kV according to patient size and/or use of iterative reconstruction technique. CONTRAST:  OMNIPAQUE IOHEXOL 350 MG/ML SOLN COMPARISON:  06/02/2017 FINDINGS: Lower chest: No acute pleural or parenchymal lung disease. Cardiomegaly without pericardial effusion. Hepatobiliary: No focal liver abnormality is seen. Status post cholecystectomy. No biliary dilatation. Pancreas: There is marked fat stranding surrounding the uncinate process and head of the pancreas, consistent with acute pancreatitis. No fluid collection, pseudocyst, or abscess. No pancreatic duct dilation. Spleen: Normal in size without focal abnormality. Adrenals/Urinary Tract: 1.5 cm right adrenal nodule and 1.0 cm left adrenal nodule unchanged since prior study, most consistent with adenomas given long-term stability. Kidneys enhance normally. Punctate less than 2 mm nonobstructing left renal calculus. No obstructive uropathy within either kidney. The bladder is minimally distended, limiting its evaluation. Stomach/Bowel: No bowel obstruction or ileus. Normal appendix right lower quadrant. Previous bariatric surgery. Small hiatal  hernia. No bowel wall thickening. Vascular/Lymphatic: No significant vascular findings are present. No enlarged abdominal or pelvic lymph nodes. Reproductive: Uterus and bilateral adnexa are unremarkable. Other: No free fluid or free intraperitoneal gas. Small fat containing  umbilical hernia. Musculoskeletal: No acute or destructive bony abnormalities. Reconstructed images demonstrate no additional findings. IMPRESSION: 1. Acute uncomplicated pancreatitis. No fluid collection, pseudocyst, or abscess. 2. Punctate less than 2 mm nonobstructing left renal calculus. 3. Stable cardiomegaly. Electronically Signed   By: Sharlet Salina M.D.   On: 10/13/2022 20:41             LOS: 0 days   Sunnie Nielsen, DO [facility] 10/14/2022, 5:24 PM    Dictation software may have been used to generate the above note. Typos may occur and escape review in typed/dictated notes. Please contact Dr Lyn Hollingshead directly for clarity if needed.  Staff may message me via secure chat in Epic  but this may not receive an immediate response,  please page me for urgent matters!  If 7PM-7AM, please contact night coverage [email or web address]

## 2022-10-14 NOTE — Hospital Course (Addendum)
Heather Conrad is a 62 y.o. female with medical history significant of anxiety, Afib (on Xarelto), h/o breast cancer, HLD, HTN, lumbar stenosis, nephrolithiasis who presented for flank and abdominal pain. Back pain 3-4 days prior, migrated to RLQ, assoc w/ anorexia, nausea, chills.   07/28: CT Abd/Pelv (+)pancreatitis, (+)UA concern for UTI, leukocytosis, hyperbilirubinemia. Admitted to hospitalist service.  07/29: WBC improved but still above normal, (+)neuts, mild increased retic, no hyper-TG. BP low, minimal po intake, will resume IV fluids and hold BP meds   Consultants:  none  Procedures: none      ASSESSMENT & PLAN:   Principal Problem:   Acute pancreatitis Active Problems:   Lower urinary tract infectious disease   Essential hypertension, benign   Atrial fibrillation (HCC)   Mixed hyperlipidemia   Personal history of breast cancer  Acute pancreatitis Presentation is odd, given symptoms and normal lipase No EtOH, r/o high TG, s/p cholecystectomy IVF  pain control  Zofran prn nausea   Hyperbilirubinemia, unconjugated bilirubin can rise in acute pancreatitis due to transient blockage of liver flow into the GI tract would need to recheck 10 days after symptoms resolve for completeness, will get a blood smear and a retic count -->    Lower urinary tract infectious disease Relatively asymptomatic, but with abdominal pain, abnormal UA and elevated WBC will treat UC and BC are pending Monitor for fever Continue rocephin pending cultures     Essential hypertension, benign Hold amlodipine and lisinopril for now    Atrial fibrillation (HCC) continue amiodarone, xarelto    Mixed hyperlipidemia Continue statin    Personal history of breast cancer Continue letrazole     DVT prophylaxis: Xarelto (for Afib)  Pertinent IV fluids/nutrition: NS 125 mL/h, can start CLD as tolerated  Central lines / invasive devices: none  Code Status: FULL CODE ACP documentation  reviewed: 10/14/22 none on file [facility]YNCA  Current Admission Status: observation may need to adjust to inpatient  TOC needs / Dispo plan: none at this time  Barriers to discharge / significant pending items: clinical improvement, culture results, still on IV therapy, expect will be here another 1-2 days

## 2022-10-14 NOTE — ED Notes (Signed)
Pt being transported to [facility] at this time via [facility].

## 2022-10-15 DIAGNOSIS — K858 Other acute pancreatitis without necrosis or infection: Secondary | ICD-10-CM | POA: Diagnosis not present

## 2022-10-15 LAB — GLUCOSE, CAPILLARY: Glucose-Capillary: 84 mg/dL (ref 70–99)

## 2022-10-15 MED ORDER — CIPROFLOXACIN HCL 500 MG PO TABS: 500.0000 mg | ORAL_TABLET | Freq: Two times a day (BID) | ORAL | 0 refills | Status: AC

## 2022-10-15 MED ORDER — SODIUM CHLORIDE 0.9 % IV BOLUS
250.0000 mL | Freq: Once | INTRAVENOUS | Status: AC
  Administered 2022-10-15: 250 mL via INTRAVENOUS

## 2022-10-15 MED ORDER — LISINOPRIL 20 MG PO TABS: 20.0000 mg | ORAL_TABLET | Freq: Every day | ORAL | Status: DC

## 2022-10-15 MED ORDER — OXYCODONE HCL 5 MG PO TABS: 5.0000 mg | ORAL_TABLET | Freq: Four times a day (QID) | ORAL | 0 refills | Status: DC | PRN

## 2022-10-15 MED ORDER — AMLODIPINE BESYLATE 5 MG PO TABS
5.0000 mg | ORAL_TABLET | Freq: Every day | ORAL | 11 refills | Status: DC
Start: 2022-10-15 — End: 2023-04-08

## 2022-10-15 MED ORDER — SODIUM CHLORIDE 0.9 % IV SOLN
2.0000 g | INTRAVENOUS | Status: DC
  Filled 2022-10-15: qty 20

## 2022-10-15 NOTE — Discharge Summary (Signed)
Physician Discharge Summary   Patient: Heather Conrad MRN: [id number]  DOB: 1960-06-06   Admit:     Date of Admission: 10/13/2022 Admitted from: home   Discharge: Date of discharge: 10/15/22 Disposition: Home Condition at discharge: good  CODE STATUS: FULL CODE     Discharge Physician: Sunnie Nielsen, DO [facility]     PCP: Margaretann Loveless, MD  Recommendations for Outpatient Follow-up:  Follow up with PCP Margaretann Loveless, MD in 1-2 weeks Please obtain labs/tests: CBC, CMP in 1-2 weeks Please follow up on the following pending results: none    Discharge Instructions     Call MD for:  severe uncontrolled pain   Complete by: As directed    Call MD for:  temperature >100.4   Complete by: As directed    Diet - advance slowly to low sodium heart healthy   Complete by: As directed    Increase activity slowly   Complete by: As directed          Discharge Diagnoses: Principal Problem:   Acute pancreatitis Active Problems:   Lower urinary tract infectious disease   Essential hypertension, benign   Atrial fibrillation (HCC)   Mixed hyperlipidemia   Personal history of breast cancer       Hospital Course: Heather Conrad is a 62 y.o. female with medical history significant of anxiety, Afib (on Xarelto), h/o breast cancer, HLD, HTN, lumbar stenosis, nephrolithiasis who presented for flank and abdominal pain. Back pain 3-4 days prior, migrated to RLQ, assoc w/ anorexia, nausea, chills.   07/28: CT Abd/Pelv (+)pancreatitis, (+)UA concern for UTI, leukocytosis, hyperbilirubinemia. Admitted to hospitalist service.  07/29: WBC improved but still above normal, (+)neuts, mild increased retic, no hyper-TG. BP low, minimal po intake, will resume IV fluids and hold BP meds  07/30: feeling better this morning, tolerating full liquids, no concerns for discharge   Consultants:  none  Procedures: none      ASSESSMENT & PLAN:   Acute  pancreatitis Presentation is odd, given symptoms and normal lipase No EtOH, r/o high TG, s/p cholecystectomy pain control  Zofran prn nausea   Hyperbilirubinemia, unconjugated bilirubin can rise in acute pancreatitis due to transient blockage of liver flow into the GI tract would need to recheck 10 days after symptoms resolve   Lower urinary tract infectious disease Relatively asymptomatic, but with abdominal pain, abnormal UA and elevated WBC will treat UCx (+)Ecoli, follow w/ PCP, sx resolved, had 3 days Rocephin     Essential hypertension, benign Hold amlodipine and lisinopril for now until follow w/ PCP unless high BP at home - see med rec, was also reviewed w/ patient     Atrial fibrillation (HCC) continue amiodarone, xarelto    Mixed hyperlipidemia Continue statin    Personal history of breast cancer Continue letrazole          Discharge Instructions  Allergies as of 10/15/2022   No Known Allergies      Medication List     STOP taking these medications    fluticasone 50 MCG/ACT nasal spray Commonly known as: FLONASE       TAKE these medications    amiodarone 200 MG tablet Commonly known as: PACERONE TAKE 1 TABLET BY MOUTH EVERY DAY   amLODipine 5 MG tablet Commonly known as: NORVASC Take 1 tablet (5 mg total) by mouth daily. HOLD UNTIL FOLLOW-UP WITH PRIMARY CARE What changed: additional instructions   Biotin 40981 MCG Tabs Take 10,000 mcg  by mouth in the morning.   ciprofloxacin 500 MG tablet Commonly known as: CIPRO Take 1 tablet (500 mg total) by mouth 2 (two) times daily for 5 days.   letrozole 2.5 MG tablet Commonly known as: FEMARA TAKE 1 TABLET(2.5 MG) BY MOUTH DAILY   lisinopril 20 MG tablet Commonly known as: ZESTRIL Take 1 tablet (20 mg total) by mouth daily. HOLD UNLESS BLOOD PRESSURE GREATER THAN 140/90 What changed: additional instructions   loratadine 10 MG tablet Commonly known as: CLARITIN Take 1 tablet (10 mg total)  by mouth daily for 60 doses.   meclizine 25 MG tablet Commonly known as: ANTIVERT Take 1 tablet (25 mg total) by mouth 3 (three) times daily as needed for dizziness.   multivitamin with minerals Tabs tablet Take 1 tablet by mouth daily. Centrum Silver for Women   oxyCODONE 5 MG immediate release tablet Commonly known as: Oxy IR/ROXICODONE Take 1 tablet (5 mg total) by mouth every 6 (six) hours as needed for moderate pain or severe pain.   rosuvastatin 20 MG tablet Commonly known as: CRESTOR Take 20 mg by mouth at bedtime.   Xarelto 20 MG Tabs tablet Generic drug: rivaroxaban Take 20 mg by mouth in the morning.         Follow-up Information     Margaretann Loveless, MD. Go on 10/24/2022.   Specialty: Internal Medicine Why: hospital follow up appointment within 1 week  Appt @ 10:30 am. Contact information: [address] [place] [address] [phone]                 No Known Allergies   Subjective: pt reports improved pain, no nausea, tolerating diet, ambulating well, no concerns for discharge    Discharge Exam: BP (!) 107/55 (BP Location: Right Arm)   Pulse (!) 42   Temp 98 F (36.7 C) (Oral)   Resp 15   Ht 5\' 3"  (1.6 m)   Wt 136.1 kg   LMP  (LMP Unknown)   SpO2 98%   BMI 53.14 kg/m  General: Pt is alert, awake, not in acute distress Cardiovascular: bradycardic but regular, no rubs, no gallops Respiratory: CTA bilaterally, no wheezing, no rhonchi Abdominal: Soft, NT, ND, bowel sounds + Extremities: no edema, no cyanosis     The results of significant diagnostics from this hospitalization (including imaging, microbiology, ancillary and laboratory) are listed below for reference.     Microbiology: Recent Results (from the past 240 hour(s))  Urine Culture     Status: Abnormal (Preliminary result)   Collection Time: 10/13/22  5:25 PM   Specimen: Urine, Clean Catch  Result Value Ref Range Status   Specimen Description   Final    URINE, CLEAN  CATCH Performed at [facility], [address], [place], [place] [address]    Special Requests   Final    NONE Performed at [facility], [address] [place], [place] [address]    Culture (A)  Final    >=100,000 COLONIES/mL ESCHERICHIA COLI SUSCEPTIBILITIES TO FOLLOW Performed at [facility], [address], [place], [place] [address]    Report Status PENDING  Incomplete  Culture, blood (routine x 2)     Status: None (Preliminary result)   Collection Time: 10/13/22  9:36 PM   Specimen: BLOOD RIGHT ARM  Result Value Ref Range Status   Specimen Description BLOOD RIGHT ARM  Final   Special Requests   Final    BOTTLES DRAWN AEROBIC AND ANAEROBIC  Blood Culture results may not be optimal due to an inadequate volume of blood received in culture bottles   Culture   Final    NO GROWTH 2 DAYS Performed at [facility], [address] [place], [place] [address]    Report Status PENDING  Incomplete  Culture, blood (routine x 2)     Status: None (Preliminary result)   Collection Time: 10/13/22  9:36 PM   Specimen: BLOOD RIGHT ARM  Result Value Ref Range Status   Specimen Description BLOOD RIGHT ARM  Final   Special Requests   Final    BOTTLES DRAWN AEROBIC AND ANAEROBIC Blood Culture results may not be optimal due to an inadequate volume of blood received in culture bottles   Culture   Final    NO GROWTH 2 DAYS Performed at [facility], [address], [place], [place] [address]    Report Status PENDING  Incomplete     Labs: BNP (last 3 results) No results for input(s): "BNP" in the last 8760 hours. Basic Metabolic Panel: Recent Labs  Lab 10/13/22 1727 10/14/22 0429  NA 134* 136  K 4.2 3.6  CL 101 105  CO2 23 23  GLUCOSE 110* 95  BUN 15 14  CREATININE 0.97 0.95  CALCIUM 9.3 8.6*   Liver Function Tests: Recent Labs  Lab 10/13/22 1727  AST 17  ALT 13  ALKPHOS 90  BILITOT 1.9*  PROT 7.6  ALBUMIN 3.9   Recent Labs   Lab 10/13/22 1727  LIPASE 51   No results for input(s): "AMMONIA" in the last 168 hours. CBC: Recent Labs  Lab 10/13/22 1727 10/14/22 0429  WBC 15.3* 11.7*  NEUTROABS  --  9.5*  HGB 14.1 12.4  HCT 41.1 35.6*  MCV 89.0 89.7  PLT 268 192   Cardiac Enzymes: No results for input(s): "CKTOTAL", "CKMB", "CKMBINDEX", "TROPONINI" in the last 168 hours. BNP: Invalid input(s): "POCBNP" CBG: Recent Labs  Lab 10/15/22 0750  GLUCAP 84   D-Dimer No results for input(s): "DDIMER" in the last 72 hours. Hgb A1c No results for input(s): "HGBA1C" in the last 72 hours. Lipid Profile Recent Labs    10/13/22 2102  CHOL 130  HDL 80  LDLCALC 43  TRIG 36  CHOLHDL 1.6   Thyroid function studies No results for input(s): "TSH", "T4TOTAL", "T3FREE", "THYROIDAB" in the last 72 hours.  Invalid input(s): "FREET3" Anemia work up Recent Labs    10/14/22 0429  RETICCTPCT 1.7   Urinalysis    Component Value Date/Time   COLORURINE YELLOW (A) 10/13/2022 1727   APPEARANCEUR HAZY (A) 10/13/2022 1727   APPEARANCEUR Clear 05/04/2011 2242   LABSPEC 1.013 10/13/2022 1727   LABSPEC 1.014 05/04/2011 2242   PHURINE 6.0 10/13/2022 1727   GLUCOSEU NEGATIVE 10/13/2022 1727   GLUCOSEU Negative 05/04/2011 2242   HGBUR SMALL (A) 10/13/2022 1727   BILIRUBINUR NEGATIVE 10/13/2022 1727   BILIRUBINUR Negative 05/04/2011 2242   KETONESUR 20 (A) 10/13/2022 1727   PROTEINUR 100 (A) 10/13/2022 1727   UROBILINOGEN 1.0 05/07/2011 0557   NITRITE POSITIVE (A) 10/13/2022 1727   LEUKOCYTESUR SMALL (A) 10/13/2022 1727   LEUKOCYTESUR Negative 05/04/2011 2242   Sepsis Labs Recent Labs  Lab 10/13/22 1727 10/14/22 0429  WBC 15.3* 11.7*   Microbiology Recent Results (from the past 240 hour(s))  Urine Culture     Status: Abnormal (Preliminary result)   Collection Time: 10/13/22  5:25 PM   Specimen: Urine, Clean Catch  Result Value Ref Range Status  Specimen Description   Final    URINE, CLEAN  CATCH Performed at [facility], [address] [place], [place] [address]    Special Requests   Final    NONE Performed at [facility], [address] [place], [place] [address]    Culture (A)  Final    >=100,000 COLONIES/mL ESCHERICHIA COLI SUSCEPTIBILITIES TO FOLLOW Performed at [facility], [address] [place], [place] [address]    Report Status PENDING  Incomplete  Culture, blood (routine x 2)     Status: None (Preliminary result)   Collection Time: 10/13/22  9:36 PM   Specimen: BLOOD RIGHT ARM  Result Value Ref Range Status   Specimen Description BLOOD RIGHT ARM  Final   Special Requests   Final    BOTTLES DRAWN AEROBIC AND ANAEROBIC Blood Culture results may not be optimal due to an inadequate volume of blood received in culture bottles   Culture   Final    NO GROWTH 2 DAYS Performed at [facility], [address], [place], [place] [address]    Report Status PENDING  Incomplete  Culture, blood (routine x 2)     Status: None (Preliminary result)   Collection Time: 10/13/22  9:36 PM   Specimen: BLOOD RIGHT ARM  Result Value Ref Range Status   Specimen Description BLOOD RIGHT ARM  Final   Special Requests   Final    BOTTLES DRAWN AEROBIC AND ANAEROBIC Blood Culture results may not be optimal due to an inadequate volume of blood received in culture bottles   Culture   Final    NO GROWTH 2 DAYS Performed at [facility], [address], [place], [place] [address]    Report Status PENDING  Incomplete   Imaging CT ABDOMEN PELVIS W CONTRAST  Result Date: 10/13/2022 CLINICAL DATA:  Right flank pain radiating to right lower abdomen, nausea and vomiting EXAM: CT ABDOMEN AND PELVIS WITH CONTRAST TECHNIQUE: Multidetector CT imaging of the abdomen and pelvis was performed using the standard protocol following bolus administration of intravenous contrast. RADIATION DOSE REDUCTION: This exam was performed according to the  departmental dose-optimization program which includes automated exposure control, adjustment of the mA and/or kV according to patient size and/or use of iterative reconstruction technique. CONTRAST:  OMNIPAQUE IOHEXOL 350 MG/ML SOLN COMPARISON:  06/02/2017 FINDINGS: Lower chest: No acute pleural or parenchymal lung disease. Cardiomegaly without pericardial effusion. Hepatobiliary: No focal liver abnormality is seen. Status post cholecystectomy. No biliary dilatation. Pancreas: There is marked fat stranding surrounding the uncinate process and head of the pancreas, consistent with acute pancreatitis. No fluid collection, pseudocyst, or abscess. No pancreatic duct dilation. Spleen: Normal in size without focal abnormality. Adrenals/Urinary Tract: 1.5 cm right adrenal nodule and 1.0 cm left adrenal nodule unchanged since prior study, most consistent with adenomas given long-term stability. Kidneys enhance normally. Punctate less than 2 mm nonobstructing left renal calculus. No obstructive uropathy within either kidney. The bladder is minimally distended, limiting its evaluation. Stomach/Bowel: No bowel obstruction or ileus. Normal appendix right lower quadrant. Previous bariatric surgery. Small hiatal hernia. No bowel wall thickening. Vascular/Lymphatic: No significant vascular findings are present. No enlarged abdominal or pelvic lymph nodes. Reproductive: Uterus and bilateral adnexa are unremarkable. Other: No free fluid or free intraperitoneal gas. Small fat containing umbilical hernia. Musculoskeletal: No acute or destructive bony abnormalities. Reconstructed images demonstrate no additional findings. IMPRESSION: 1. Acute uncomplicated pancreatitis. No fluid collection, pseudocyst, or abscess. 2. Punctate less than  2 mm nonobstructing left renal calculus. 3. Stable cardiomegaly. Electronically Signed   By: Sharlet Salina M.D.   On: 10/13/2022 20:41      Time coordinating discharge: over 30  minutes  SIGNED:  Sunnie Nielsen DO [facility]

## 2022-10-15 NOTE — Progress Notes (Signed)
   10/14/22 2330  Assess: MEWS Score  Temp 98 F (36.7 C)  BP (!) 98/47  Pulse Rate (!) 48  Resp 20  Level of Consciousness Alert  SpO2 92 %  O2 Device Room Air  Assess: MEWS Score  MEWS Temp 0  MEWS Systolic 1  MEWS Pulse 1  MEWS RR 0  MEWS LOC 0  MEWS Score 2  MEWS Score Color Yellow  Assess: if the MEWS score is Yellow or Red  Were vital signs accurate and taken at a resting state? Yes  Does the patient meet 2 or more of the SIRS criteria? No  MEWS guidelines implemented  Yes, yellow  Treat  MEWS Interventions Considered administering scheduled or prn medications/treatments as ordered  Take Vital Signs  Increase Vital Sign Frequency  Yellow: Q2hr x1, continue Q4hrs until patient remains green for 12hrs  Escalate  MEWS: Escalate Yellow: Discuss with charge nurse and consider notifying provider and/or RRT  Notify: Charge Nurse/RN  Name of Charge Nurse/RN Notified [place], RN  Provider Notification  Provider Name/Title Valente David, MD  Date Provider Notified 10/15/22  Time Provider Notified 0045  Method of Notification  (epic secure chat)  Notification Reason Other (Comment) (MEWS YELLOW)  Provider response See new orders (NS bolus)  Date of Provider Response 10/15/22  Time of Provider Response 0050  Notify: Rapid Response  Name of Rapid Response RN Notified Not needed at this time  Assess: SIRS CRITERIA  SIRS Temperature  0  SIRS Pulse 0  SIRS Respirations  0  SIRS WBC 0  SIRS Score Sum  0

## 2022-10-24 ENCOUNTER — Ambulatory Visit: Payer: 59 | Admitting: Internal Medicine

## 2022-11-13 ENCOUNTER — Other Ambulatory Visit: Payer: Self-pay | Admitting: Internal Medicine

## 2022-11-14 ENCOUNTER — Ambulatory Visit: Payer: 59 | Admitting: Internal Medicine

## 2022-12-26 ENCOUNTER — Other Ambulatory Visit: Payer: Self-pay | Admitting: Cardiovascular Disease

## 2023-01-05 ENCOUNTER — Other Ambulatory Visit: Payer: Self-pay | Admitting: Internal Medicine

## 2023-01-13 ENCOUNTER — Other Ambulatory Visit: Payer: Self-pay | Admitting: Internal Medicine

## 2023-02-20 ENCOUNTER — Other Ambulatory Visit: Payer: Self-pay | Admitting: Internal Medicine

## 2023-02-21 ENCOUNTER — Encounter: Payer: Self-pay | Admitting: Cardiovascular Disease

## 2023-02-21 ENCOUNTER — Ambulatory Visit (INDEPENDENT_AMBULATORY_CARE_PROVIDER_SITE_OTHER): Payer: 59 | Admitting: Cardiovascular Disease

## 2023-02-21 VITALS — BP 108/68 | HR 51 | Ht 63.0 in | Wt 250.0 lb

## 2023-02-21 DIAGNOSIS — R9431 Abnormal electrocardiogram [ECG] [EKG]: Secondary | ICD-10-CM

## 2023-02-21 DIAGNOSIS — I10 Essential (primary) hypertension: Secondary | ICD-10-CM | POA: Diagnosis not present

## 2023-02-21 DIAGNOSIS — Z6841 Body Mass Index (BMI) 40.0 and over, adult: Secondary | ICD-10-CM

## 2023-02-21 DIAGNOSIS — I4891 Unspecified atrial fibrillation: Secondary | ICD-10-CM

## 2023-02-21 DIAGNOSIS — Z01810 Encounter for preprocedural cardiovascular examination: Secondary | ICD-10-CM | POA: Diagnosis not present

## 2023-02-21 NOTE — Progress Notes (Signed)
Cardiology Office Note   Date:  02/21/2023   ID:  Heather Conrad, DOB 1960-12-22, MRN [id number]  PCP:  Margaretann Loveless, MD  Cardiologist:  Adrian Blackwater, MD      History of Present Illness: Heather Conrad is a 62 y.o. 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   Atrial fibrillation (HCC)    Breast cancer (HCC)    Breast cancer in female (HCC) 07/16/2017   Right-invasive mammary cancer   Cancer (HCC)    Cough    Dry skin    Dyspnea    Dysrhythmia 01/2017   atrial fibrillation   Fatigue    Heart murmur    per pt report   History of kidney stones    History of MRSA infection 2013   leg, abdomen, arm   HPV in female    Hyperlipidemia 2018   Hypertension    Kidney stone 01/2017   Leg cramping    Leg pain    Lumbar stenosis    Mixed hyperlipidemia    Nervousness    Obesity 01/2017   Palpitations    Personal history of radiation therapy    Pyelonephritis    Shortness of breath on exertion    Stress    Swelling of both lower extremities    UTI (urinary tract infection)    Vitamin D deficiency    Weakness      Past Surgical History:  Procedure Laterality Date   BREAST BIOPSY Right 07/16/2017   Invasive mammary carcinoma   BREAST LUMPECTOMY Right 09/01/2017   F/U radation, clear margins   BREAST LUMPECTOMY WITH RADIOACTIVE SEED AND SENTINEL LYMPH NODE BIOPSY Right 09/01/2017   Procedure: BREAST LUMPECTOMY WITH RADIOACTIVE SEED AND SENTINEL LYMPH NODE BIOPSY;  Surgeon: Ovidio Kin, MD;  Location: [facility] OR;  Service: General;  Laterality: Right;   CARDIOVERSION Right 07/27/2020   Procedure: CARDIOVERSION;  Surgeon: Laurier Nancy, MD;  Location: [facility] ORS;  Service: Cardiovascular;  Laterality: Right;   CHOLECYSTECTOMY     1992    DILATION AND CURETTAGE OF UTERUS     EXTRACORPOREAL SHOCK WAVE LITHOTRIPSY Left 02/13/2017   Procedure: EXTRACORPOREAL SHOCK WAVE LITHOTRIPSY (ESWL);  Surgeon: Orson Ape, MD;  Location: [facility] ORS;  Service: Urology;  Laterality: Left;   FOOT FRACTURE SURGERY Right 2018   repair of trigonem of ankle. endoscopic fasciotomy   INDUCED ABORTION     KNEE SURGERY Right    knee scope for torn meniscus   LUMBAR LAMINECTOMY/DECOMPRESSION MICRODISCECTOMY Bilateral 10/15/2017   Procedure: Laminectomy and Foraminotomy - Lumbar one-two, Lumbar two-three, Lumbar three-four  - bilateral;  Surgeon: Tia Alert, MD;  Location: [facility] OR;  Service: Neurosurgery;  Laterality: Bilateral;     Current Outpatient Medications  Medication Sig Dispense Refill   amiodarone (PACERONE) 200 MG tablet TAKE 1 TABLET BY MOUTH EVERY DAY 90 tablet 0   amLODipine (NORVASC) 5 MG tablet Take 1 tablet (5 mg total) by mouth daily. HOLD UNTIL FOLLOW-UP WITH PRIMARY CARE 30 tablet 11   letrozole (FEMARA) 2.5 MG tablet TAKE 1 TABLET(2.5 MG) BY MOUTH DAILY 90 tablet 1   lisinopril (ZESTRIL) 20 MG tablet TAKE 1 TABLET BY  MOUTH EVERY DAY 90 tablet 1   Multiple Vitamin (MULTIVITAMIN WITH MINERALS) TABS tablet Take 1 tablet by mouth daily. Centrum Silver for Women     rivaroxaban (XARELTO) 20 MG TABS tablet TAKE 1 TABLET BY MOUTH ONCE A DAY 90 tablet 0   rosuvastatin (CRESTOR) 20 MG tablet TAKE 1 TABLET BY MOUTH EVERY DAY 90 tablet 3   loratadine (CLARITIN) 10 MG tablet Take 1 tablet (10 mg total) by mouth daily for 60 doses. (Patient not taking: Reported on 02/21/2023) 30 tablet 1   No current facility-administered medications for this visit.    Allergies:   Patient has no known allergies.    Social History:   reports that she has never smoked. She has never used smokeless tobacco. She reports current alcohol use. She reports that she does not use drugs.   Family History:  family history includes Anxiety disorder in her mother;  Breast cancer (age of onset: 22) in her cousin; Colon cancer (age of onset: 61) in her paternal uncle; Depression in her mother; Diabetes in her maternal aunt; Heart disease in her father and mother; Hyperlipidemia in her mother; Hypertension in her father, maternal aunt, mother, and paternal uncle; Kidney disease in her father; Obesity in her mother; Sleep apnea in her mother; Uterine cancer (age of onset: 26) in her sister.    ROS:     Review of Systems  Constitutional: Negative.   HENT: Negative.    Eyes: Negative.   Respiratory: Negative.    Gastrointestinal: Negative.   Genitourinary: Negative.   Musculoskeletal: Negative.   Skin: Negative.   Neurological: Negative.   Endo/Heme/Allergies: Negative.   Psychiatric/Behavioral: Negative.    All other systems reviewed and are negative.     All other systems are reviewed and negative.    PHYSICAL EXAM: VS:  BP 108/68   Pulse (!) 51   Ht 5\' 3"  (1.6 m)   Wt 250 lb (113.4 kg)   LMP  (LMP Unknown)   SpO2 97%   BMI 44.29 kg/m  , BMI Body mass index is 44.29 kg/m. Last weight:  Wt Readings from Last 3 Encounters:  02/21/23 250 lb (113.4 kg)  10/13/22 300 lb (136.1 kg)  07/11/22 235 lb 9.6 oz (106.9 kg)     Physical Exam Constitutional:      Appearance: Normal appearance.  Cardiovascular:     Rate and Rhythm: Normal rate and regular rhythm.     Heart sounds: Normal heart sounds.  Pulmonary:     Effort: Pulmonary effort is normal.     Breath sounds: Normal breath sounds.  Musculoskeletal:     Right lower leg: No edema.     Left lower leg: No edema.  Neurological:     Mental Status: She is alert.       EKG:   Recent Labs: 10/13/2022: ALT 13 10/14/2022: BUN 14; Creatinine, Ser 0.95; Hemoglobin 12.4; Platelets 192; Potassium 3.6; Sodium 136    Lipid Panel    Component Value Date/Time   CHOL 130 10/13/2022 2102   CHOL 184 11/25/2017 1106   TRIG 36 10/13/2022 2102   HDL 80 10/13/2022 2102   HDL 48 11/25/2017  1106   CHOLHDL 1.6 10/13/2022 2102   VLDL 7 10/13/2022 2102   LDLCALC 43 10/13/2022 2102   LDLCALC 121 (H) 11/25/2017 1106      Other studies Reviewed: Additional studies/ records that were reviewed today include:  Review of the above records demonstrates:  No data to display            ASSESSMENT AND PLAN:    ICD-10-CM   1. Atrial fibrillation, unspecified type (HCC)  I48.91 PCV ECHOCARDIOGRAM COMPLETE   Feeling fine, needs Knee surgery    2. Essential hypertension, benign  I10 PCV ECHOCARDIOGRAM COMPLETE    3. Morbid obesity with BMI of 60.0-69.9, adult (HCC)  E66.01 PCV ECHOCARDIOGRAM COMPLETE   Z68.44     4. Pre-operative cardiovascular exam, new EKG abnormalities c/w ischemia  Z01.810 PCV ECHOCARDIOGRAM COMPLETE   R94.31    Low risk for knee surgery, proceed with knee surgery       Problem List Items Addressed This Visit       Cardiovascular and Mediastinum   Essential hypertension, benign   Relevant Orders   PCV ECHOCARDIOGRAM COMPLETE   Atrial fibrillation (HCC) - Primary   Relevant Orders   PCV ECHOCARDIOGRAM COMPLETE     Other   Morbid obesity with BMI of 60.0-69.9, adult (HCC)   Relevant Orders   PCV ECHOCARDIOGRAM COMPLETE   Other Visit Diagnoses     Pre-operative cardiovascular exam, new EKG abnormalities c/w ischemia       Low risk for knee surgery, proceed with knee surgery   Relevant Orders   PCV ECHOCARDIOGRAM COMPLETE          Disposition:   Return in about 1 week (around 02/28/2023) for echo and f/u.    Total time spent: 30 minutes  Signed,  Adrian Blackwater, MD  02/21/2023 9:47 AM    [facility]

## 2023-02-28 ENCOUNTER — Ambulatory Visit (INDEPENDENT_AMBULATORY_CARE_PROVIDER_SITE_OTHER): Payer: 59

## 2023-02-28 DIAGNOSIS — R9431 Abnormal electrocardiogram [ECG] [EKG]: Secondary | ICD-10-CM

## 2023-02-28 DIAGNOSIS — I4891 Unspecified atrial fibrillation: Secondary | ICD-10-CM

## 2023-02-28 DIAGNOSIS — Z6841 Body Mass Index (BMI) 40.0 and over, adult: Secondary | ICD-10-CM

## 2023-02-28 DIAGNOSIS — I351 Nonrheumatic aortic (valve) insufficiency: Secondary | ICD-10-CM

## 2023-02-28 DIAGNOSIS — I340 Nonrheumatic mitral (valve) insufficiency: Secondary | ICD-10-CM

## 2023-02-28 DIAGNOSIS — I10 Essential (primary) hypertension: Secondary | ICD-10-CM

## 2023-02-28 DIAGNOSIS — I361 Nonrheumatic tricuspid (valve) insufficiency: Secondary | ICD-10-CM | POA: Diagnosis not present

## 2023-03-04 ENCOUNTER — Ambulatory Visit (INDEPENDENT_AMBULATORY_CARE_PROVIDER_SITE_OTHER): Payer: 59 | Admitting: Cardiovascular Disease

## 2023-03-04 ENCOUNTER — Encounter: Payer: Self-pay | Admitting: Cardiovascular Disease

## 2023-03-04 VITALS — BP 102/68 | HR 51 | Ht 63.0 in | Wt 251.0 lb

## 2023-03-04 DIAGNOSIS — I10 Essential (primary) hypertension: Secondary | ICD-10-CM

## 2023-03-04 DIAGNOSIS — I351 Nonrheumatic aortic (valve) insufficiency: Secondary | ICD-10-CM

## 2023-03-04 DIAGNOSIS — I4891 Unspecified atrial fibrillation: Secondary | ICD-10-CM

## 2023-03-04 DIAGNOSIS — I340 Nonrheumatic mitral (valve) insufficiency: Secondary | ICD-10-CM

## 2023-03-04 DIAGNOSIS — E782 Mixed hyperlipidemia: Secondary | ICD-10-CM

## 2023-03-04 DIAGNOSIS — R9431 Abnormal electrocardiogram [ECG] [EKG]: Secondary | ICD-10-CM

## 2023-03-04 NOTE — Progress Notes (Addendum)
Cardiology Office Note   Date:  03/04/2023   ID:  KALITA NASUTI, DOB November 08, 1960, MRN [id number]  PCP:  Margaretann Loveless, MD  Cardiologist:  Adrian Blackwater, MD      History of Present Illness: Heather Conrad is a 62 y.o. female who presents for  Chief Complaint  Patient presents with   Follow-up    Echo results    I feel fine, no symptoms      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   Atrial fibrillation (HCC)    Breast cancer (HCC)    Breast cancer in female (HCC) 07/16/2017   Right-invasive mammary cancer   Cancer (HCC)    Cough    Dry skin    Dyspnea    Dysrhythmia 01/2017   atrial fibrillation   Fatigue    Heart murmur    per pt report   History of kidney stones    History of MRSA infection 2013   leg, abdomen, arm   HPV in female    Hyperlipidemia 2018   Hypertension    Kidney stone 01/2017   Leg cramping    Leg pain    Lumbar stenosis    Mixed hyperlipidemia    Nervousness    Obesity 01/2017   Palpitations    Personal history of radiation therapy    Pyelonephritis    Shortness of breath on exertion    Stress    Swelling of both lower extremities    UTI (urinary tract infection)    Vitamin D deficiency    Weakness      Past Surgical History:  Procedure Laterality Date   BREAST BIOPSY Right 07/16/2017   Invasive mammary carcinoma   BREAST LUMPECTOMY Right 09/01/2017   F/U radation, clear margins   BREAST LUMPECTOMY WITH RADIOACTIVE SEED AND SENTINEL LYMPH NODE BIOPSY Right 09/01/2017   Procedure: BREAST LUMPECTOMY WITH RADIOACTIVE SEED AND SENTINEL LYMPH NODE BIOPSY;  Surgeon: Ovidio Kin, MD;  Location: [facility] OR;  Service: General;  Laterality: Right;   CARDIOVERSION Right 07/27/2020   Procedure: CARDIOVERSION;  Surgeon: Laurier Nancy, MD;  Location: [facility] ORS;  Service: Cardiovascular;  Laterality: Right;   CHOLECYSTECTOMY     1992   DILATION  AND CURETTAGE OF UTERUS     EXTRACORPOREAL SHOCK WAVE LITHOTRIPSY Left 02/13/2017   Procedure: EXTRACORPOREAL SHOCK WAVE LITHOTRIPSY (ESWL);  Surgeon: Orson Ape, MD;  Location: [facility] ORS;  Service: Urology;  Laterality: Left;   FOOT FRACTURE SURGERY Right 2018   repair of trigonem of ankle. endoscopic fasciotomy   INDUCED ABORTION     KNEE SURGERY Right    knee scope for torn meniscus   LUMBAR LAMINECTOMY/DECOMPRESSION MICRODISCECTOMY Bilateral 10/15/2017   Procedure: Laminectomy and Foraminotomy - Lumbar one-two, Lumbar two-three, Lumbar three-four  - bilateral;  Surgeon: Tia Alert, MD;  Location: [facility] OR;  Service: Neurosurgery;  Laterality: Bilateral;     Current Outpatient Medications  Medication Sig Dispense Refill   amiodarone (PACERONE) 200 MG tablet TAKE 1 TABLET BY MOUTH EVERY DAY 90 tablet 0   amLODipine (NORVASC) 5 MG tablet Take 1 tablet (5 mg total) by mouth daily. HOLD UNTIL FOLLOW-UP WITH PRIMARY CARE 30 tablet 11   letrozole (FEMARA) 2.5 MG tablet TAKE 1 TABLET(2.5 MG) BY MOUTH DAILY 90 tablet 1   lisinopril (ZESTRIL) 20 MG tablet TAKE 1 TABLET  BY MOUTH EVERY DAY 90 tablet 1   Multiple Vitamin (MULTIVITAMIN WITH MINERALS) TABS tablet Take 1 tablet by mouth daily. Centrum Silver for Women     rivaroxaban (XARELTO) 20 MG TABS tablet TAKE 1 TABLET BY MOUTH ONCE A DAY 90 tablet 0   rosuvastatin (CRESTOR) 20 MG tablet TAKE 1 TABLET BY MOUTH EVERY DAY 90 tablet 3   loratadine (CLARITIN) 10 MG tablet Take 1 tablet (10 mg total) by mouth daily for 60 doses. (Patient not taking: Reported on 02/21/2023) 30 tablet 1   No current facility-administered medications for this visit.    Allergies:   Patient has no known allergies.    Social History:   reports that she has never smoked. She has never used smokeless tobacco. She reports current alcohol use. She reports that she does not use drugs.   Family History:  family history includes Anxiety disorder in her mother; Breast  cancer (age of onset: 81) in her cousin; Colon cancer (age of onset: 69) in her paternal uncle; Depression in her mother; Diabetes in her maternal aunt; Heart disease in her father and mother; Hyperlipidemia in her mother; Hypertension in her father, maternal aunt, mother, and paternal uncle; Kidney disease in her father; Obesity in her mother; Sleep apnea in her mother; Uterine cancer (age of onset: 71) in her sister.    ROS:     Review of Systems  Constitutional: Negative.   HENT: Negative.    Eyes: Negative.   Respiratory: Negative.    Gastrointestinal: Negative.   Genitourinary: Negative.   Musculoskeletal: Negative.   Skin: Negative.   Neurological: Negative.   Endo/Heme/Allergies: Negative.   Psychiatric/Behavioral: Negative.    All other systems reviewed and are negative.     All other systems are reviewed and negative.    PHYSICAL EXAM: VS:  BP 102/68   Pulse (!) 51   Ht 5\' 3"  (1.6 m)   Wt 251 lb (113.9 kg)   LMP  (LMP Unknown)   SpO2 97%   BMI 44.46 kg/m  , BMI Body mass index is 44.46 kg/m. Last weight:  Wt Readings from Last 3 Encounters:  03/04/23 251 lb (113.9 kg)  02/21/23 250 lb (113.4 kg)  10/13/22 300 lb (136.1 kg)     Physical Exam Constitutional:      Appearance: Normal appearance.  Cardiovascular:     Rate and Rhythm: Normal rate and regular rhythm.     Heart sounds: Normal heart sounds.  Pulmonary:     Effort: Pulmonary effort is normal.     Breath sounds: Normal breath sounds.  Musculoskeletal:     Right lower leg: No edema.     Left lower leg: No edema.  Neurological:     Mental Status: She is alert.       EKG:   Recent Labs: 10/13/2022: ALT 13 10/14/2022: BUN 14; Creatinine, Ser 0.95; Hemoglobin 12.4; Platelets 192; Potassium 3.6; Sodium 136    Lipid Panel    Component Value Date/Time   CHOL 130 10/13/2022 2102   CHOL 184 11/25/2017 1106   TRIG 36 10/13/2022 2102   HDL 80 10/13/2022 2102   HDL 48 11/25/2017 1106   CHOLHDL  1.6 10/13/2022 2102   VLDL 7 10/13/2022 2102   LDLCALC 43 10/13/2022 2102   LDLCALC 121 (H) 11/25/2017 1106      Other studies Reviewed: Additional studies/ records that were reviewed today include:  Review of the above records demonstrates:       No  data to display            ASSESSMENT AND PLAN:    ICD-10-CM   1. Pre-operative cardiovascular exam, new EKG abnormalities c/w ischemia  Z01.810    R94.31    ECHO showed normal LVEF, trace MR, mild MR. Low risk  proceed  knee surgery. Hold xarelto 3 days before starting today, and resume a day after surgery .    2. Atrial fibrillation, unspecified type (HCC)  I48.91    In NSR    3. Essential hypertension, benign  I10     4. Mixed hyperlipidemia  E78.2     5. Nonrheumatic mitral valve regurgitation  I34.0    Trace with normal LVEF    6. Nonrheumatic aortic valve insufficiency  I35.1    MILD       Problem List Items Addressed This Visit       Cardiovascular and Mediastinum   Essential hypertension, benign   Atrial fibrillation (HCC)     Other   Mixed hyperlipidemia   Other Visit Diagnoses       Pre-operative cardiovascular exam, new EKG abnormalities c/w ischemia    -  Primary   ECHO showed normal LVEF, trace MR, mild MR. Low risk  proceed  knee surgery. Hold xarelto 3 days before starting today, and resume a day after surgery .     Nonrheumatic mitral valve regurgitation       Trace with normal LVEF     Nonrheumatic aortic valve insufficiency       MILD          Disposition:   Return in about 3 months (around 06/02/2023).    Total time spent: 30 minutes  Signed,  Adrian Blackwater, MD  03/04/2023 10:01 AM    [facility]

## 2023-03-21 DIAGNOSIS — R600 Localized edema: Secondary | ICD-10-CM | POA: Insufficient documentation

## 2023-03-21 DIAGNOSIS — M25561 Pain in right knee: Secondary | ICD-10-CM | POA: Insufficient documentation

## 2023-03-21 HISTORY — DX: Pain in right knee: M25.561

## 2023-03-25 ENCOUNTER — Other Ambulatory Visit: Payer: Self-pay | Admitting: Cardiovascular Disease

## 2023-04-08 ENCOUNTER — Other Ambulatory Visit: Payer: Self-pay | Admitting: Internal Medicine

## 2023-04-08 DIAGNOSIS — I10 Essential (primary) hypertension: Secondary | ICD-10-CM

## 2023-06-05 ENCOUNTER — Ambulatory Visit: Payer: 59 | Admitting: Cardiovascular Disease

## 2023-06-25 ENCOUNTER — Other Ambulatory Visit: Payer: Self-pay | Admitting: Family

## 2023-07-19 ENCOUNTER — Other Ambulatory Visit: Payer: Self-pay | Admitting: Cardiovascular Disease

## 2023-08-06 ENCOUNTER — Encounter: Payer: Self-pay | Admitting: Family

## 2023-08-06 ENCOUNTER — Ambulatory Visit: Payer: Self-pay | Admitting: Family

## 2023-08-06 VITALS — BP 130/64 | HR 42 | Ht 63.0 in | Wt 255.2 lb

## 2023-08-06 DIAGNOSIS — I340 Nonrheumatic mitral (valve) insufficiency: Secondary | ICD-10-CM | POA: Insufficient documentation

## 2023-08-06 DIAGNOSIS — I10 Essential (primary) hypertension: Secondary | ICD-10-CM

## 2023-08-06 DIAGNOSIS — E782 Mixed hyperlipidemia: Secondary | ICD-10-CM

## 2023-08-06 DIAGNOSIS — N182 Chronic kidney disease, stage 2 (mild): Secondary | ICD-10-CM | POA: Insufficient documentation

## 2023-08-06 DIAGNOSIS — Z923 Personal history of irradiation: Secondary | ICD-10-CM

## 2023-08-06 DIAGNOSIS — F419 Anxiety disorder, unspecified: Secondary | ICD-10-CM | POA: Insufficient documentation

## 2023-08-06 DIAGNOSIS — E559 Vitamin D deficiency, unspecified: Secondary | ICD-10-CM | POA: Diagnosis not present

## 2023-08-06 DIAGNOSIS — Z9289 Personal history of other medical treatment: Secondary | ICD-10-CM | POA: Insufficient documentation

## 2023-08-06 DIAGNOSIS — Z6841 Body Mass Index (BMI) 40.0 and over, adult: Secondary | ICD-10-CM

## 2023-08-06 DIAGNOSIS — Z9049 Acquired absence of other specified parts of digestive tract: Secondary | ICD-10-CM | POA: Insufficient documentation

## 2023-08-06 DIAGNOSIS — E538 Deficiency of other specified B group vitamins: Secondary | ICD-10-CM

## 2023-08-06 DIAGNOSIS — Z9889 Other specified postprocedural states: Secondary | ICD-10-CM | POA: Insufficient documentation

## 2023-08-06 DIAGNOSIS — Z8614 Personal history of Methicillin resistant Staphylococcus aureus infection: Secondary | ICD-10-CM | POA: Insufficient documentation

## 2023-08-06 DIAGNOSIS — R5383 Other fatigue: Secondary | ICD-10-CM

## 2023-08-06 DIAGNOSIS — I351 Nonrheumatic aortic (valve) insufficiency: Secondary | ICD-10-CM | POA: Insufficient documentation

## 2023-08-06 DIAGNOSIS — I951 Orthostatic hypotension: Secondary | ICD-10-CM | POA: Insufficient documentation

## 2023-08-06 DIAGNOSIS — I8390 Asymptomatic varicose veins of unspecified lower extremity: Secondary | ICD-10-CM | POA: Insufficient documentation

## 2023-08-06 DIAGNOSIS — Z903 Acquired absence of stomach [part of]: Secondary | ICD-10-CM

## 2023-08-06 DIAGNOSIS — M25472 Effusion, left ankle: Secondary | ICD-10-CM

## 2023-08-06 DIAGNOSIS — M1711 Unilateral primary osteoarthritis, right knee: Secondary | ICD-10-CM

## 2023-08-06 DIAGNOSIS — R7303 Prediabetes: Secondary | ICD-10-CM

## 2023-08-06 HISTORY — DX: Personal history of irradiation: Z92.3

## 2023-08-06 HISTORY — DX: Morbid (severe) obesity due to excess calories: E66.01

## 2023-08-06 HISTORY — DX: Acquired absence of stomach (part of): Z90.3

## 2023-08-06 HISTORY — DX: Unilateral primary osteoarthritis, right knee: M17.11

## 2023-08-06 HISTORY — DX: Acquired absence of other specified parts of digestive tract: Z90.49

## 2023-08-06 HISTORY — DX: Personal history of Methicillin resistant Staphylococcus aureus infection: Z86.14

## 2023-08-06 MED ORDER — WEGOVY 0.25 MG/0.5ML ~~LOC~~ SOAJ: 0.2500 mg | SUBCUTANEOUS | 0 refills | Status: DC

## 2023-08-06 NOTE — Progress Notes (Signed)
 Established Patient Office Visit  Subjective:  Patient ID: Heather Conrad, female    DOB: [birth year]  Age: 63 y.o. MRN: [id number]  Chief Complaint  Patient presents with   Follow-up    Patient is here today for her  follow up.  She has been feeling fairly well since last appointment.   She does have additional concerns to discuss today.  She would like to discuss weight loss options.  She has previously been on Wegovy , but her insurance is not covering it any longer.   Labs are due today.  She needs refills.   I have reviewed her active problem list, medication list, allergies, notes from last encounter, lab results for her appointment today.      No other concerns at this time.   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   Atrial fibrillation (HCC)    Breast cancer (HCC)    Breast cancer in female (HCC) 07/16/2017   Right-invasive mammary cancer   Cancer (HCC)    Cough    Dry skin    Dyspnea    Dysrhythmia 01/2017   atrial fibrillation   Fatigue    Heart murmur    per pt report   History of kidney stones    History of MRSA infection 2013   leg, abdomen, arm   HPV in female    Hyperlipidemia 2018   Hypertension    Kidney stone 01/2017   Leg cramping    Leg pain    Lumbar stenosis    Mixed hyperlipidemia    Nervousness    Obesity 01/2017   Palpitations    Personal history of radiation therapy    Pyelonephritis    Shortness of breath on exertion    Stress    Swelling of both lower extremities    UTI (urinary tract infection)    Vitamin D  deficiency    Weakness     Past Surgical History:  Procedure Laterality Date   BREAST BIOPSY Right 07/16/2017   Invasive mammary carcinoma   BREAST LUMPECTOMY Right 09/01/2017   F/U radation, clear margins   BREAST LUMPECTOMY WITH RADIOACTIVE SEED AND SENTINEL LYMPH NODE BIOPSY Right 09/01/2017   Procedure: BREAST  LUMPECTOMY WITH RADIOACTIVE SEED AND SENTINEL LYMPH NODE BIOPSY;  Surgeon: Ethyl Lenis, MD;  Location: [facility] OR;  Service: General;  Laterality: Right;   CARDIOVERSION Right 07/27/2020   Procedure: CARDIOVERSION;  Surgeon: Fernand Denyse LABOR, MD;  Location: [facility] ORS;  Service: Cardiovascular;  Laterality: Right;   CHOLECYSTECTOMY     1992   DILATION AND CURETTAGE OF UTERUS     EXTRACORPOREAL SHOCK WAVE LITHOTRIPSY Left 02/13/2017   Procedure: EXTRACORPOREAL SHOCK WAVE LITHOTRIPSY (ESWL);  Surgeon: Kassie Ozell SAUNDERS, MD;  Location: [facility] ORS;  Service: Urology;  Laterality: Left;   FOOT FRACTURE SURGERY Right 2018   repair of trigonem of ankle. endoscopic fasciotomy   INDUCED ABORTION     KNEE SURGERY Right    knee scope for torn meniscus   LUMBAR LAMINECTOMY/DECOMPRESSION MICRODISCECTOMY Bilateral 10/15/2017   Procedure: Laminectomy and Foraminotomy - Lumbar one-two, Lumbar two-three, Lumbar three-four  - bilateral;  Surgeon: Joshua Lenis RAMAN, MD;  Location: [facility] OR;  Service: Neurosurgery;  Laterality: Bilateral;    Social History   Socioeconomic History   Marital status: Single    Spouse name: Not on file   Number of children:  2   Years of education: 59   Highest education level: Not on file  Occupational History   Occupation: Architect  Tobacco Use   Smoking status: Never   Smokeless tobacco: Never  Vaping Use   Vaping status: Never Used  Substance and Sexual Activity   Alcohol use: Yes    Comment: rare   Drug use: No   Sexual activity: Not Currently    Birth control/protection: Post-menopausal  Other Topics Concern   Not on file  Social History Narrative   Not on file   Social Drivers of Health   Financial Resource Strain: Not on file  Food Insecurity: No Food Insecurity (10/14/2022)   Hunger Vital Sign    Worried About Running Out of Food in the Last Year: Never true    Ran Out of Food in the Last Year: Never true  Transportation Needs: No Transportation Needs  (10/14/2022)   PRAPARE - Administrator, Civil Service (Medical): No    Lack of Transportation (Non-Medical): No  Physical Activity: Not on file  Stress: Not on file  Social Connections: Not on file  Intimate Partner Violence: Not At Risk (10/14/2022)   Humiliation, Afraid, Rape, and Kick questionnaire    Fear of Current or Ex-Partner: No    Emotionally Abused: No    Physically Abused: No    Sexually Abused: No    Family History  Problem Relation Age of Onset   Heart disease Father        deceased 53; had 15 siblings   Hypertension Father    Kidney disease Father    Colon cancer Paternal Uncle 14       deceased 62s   Hypertension Paternal Uncle    Diabetes Maternal Aunt    Hypertension Maternal Aunt    Heart disease Mother        deceased at 38; had 7 siblings   Hypertension Mother    Hyperlipidemia Mother    Depression Mother    Anxiety disorder Mother    Sleep apnea Mother    Obesity Mother    Uterine cancer Sister 39       currently 94; no children   Breast cancer Cousin 61       currently 61; daughter of unaffected maternal aunt    No Known Allergies  Review of Systems  All other systems reviewed and are negative.      Objective:   BP 130/64   Pulse (!) 42   Ht 5' 3 (1.6 m)   Wt 255 lb 3.2 oz (115.8 kg)   LMP  (LMP Unknown)   SpO2 98%   BMI 45.21 kg/m   Vitals:   08/06/23 0933  BP: 130/64  Pulse: (!) 42  Height: 5' 3 (1.6 m)  Weight: 255 lb 3.2 oz (115.8 kg)  SpO2: 98%  BMI (Calculated): 45.22    Physical Exam Vitals and nursing note reviewed.  Constitutional:      Appearance: Normal appearance. She is normal weight.  HENT:     Head: Normocephalic.  Eyes:     Extraocular Movements: Extraocular movements intact.     Conjunctiva/sclera: Conjunctivae normal.     Pupils: Pupils are equal, round, and reactive to light.  Cardiovascular:     Rate and Rhythm: Normal rate.  Pulmonary:     Effort: Pulmonary effort is normal.   Neurological:     General: No focal deficit present.     Mental Status: She is alert  and oriented to person, place, and time. Mental status is at baseline.  Psychiatric:        Mood and Affect: Mood normal.        Behavior: Behavior normal.        Thought Content: Thought content normal.      No results found for any visits on 08/06/23.  No results found for this or any previous visit (from the past 2160 hours).     Assessment & Plan Essential hypertension, benign Blood pressure well controlled with current medications.  Continue current therapy.  Will reassess at follow up.   - CBC w/Diff - CMP w/eGFR  Mixed hyperlipidemia Checking labs today.  Continue current therapy for lipid control. Will modify as needed based on labwork results.   -CMP w/eGFR -Lipid Panel  Morbid obesity with BMI of 60.0-69.9, adult [facility]) Patient given samples of Wegovy  in office today.  Will see if we are able to get these approved for her.   Vitamin D  deficiency, unspecified B12 deficiency due to diet Other fatigue Checking labs today.  Will continue supplements as needed.   - Vitamin D  - Vitamin B12 - TSH  Prediabetes A1C Continues to be in prediabetic ranges.  Will reassess at follow up after next lab check.  Patient counseled on dietary choices and verbalized understanding.   -CBC w/Diff -CMP w/eGFR -Hemoglobin A1C  Ankle swelling, left Checking Uric Acid today.  Will call with results when available.     Return in about 1 month (around 09/06/2023).   Total time spent: 30 minutes  ALAN CHRISTELLA ARRANT, FNP  08/06/2023   This document may have been prepared by [facility] Voice Recognition software and as such may include unintentional dictation errors.

## 2023-08-07 LAB — CBC WITH DIFFERENTIAL/PLATELET
Basophils Absolute: 0 10*3/uL (ref 0.0–0.2)
Basos: 1 %
EOS (ABSOLUTE): 0.1 10*3/uL (ref 0.0–0.4)
Eos: 3 %
Hematocrit: 37.4 % (ref 34.0–46.6)
Hemoglobin: 12.4 g/dL (ref 11.1–15.9)
Immature Grans (Abs): 0 10*3/uL (ref 0.0–0.1)
Immature Granulocytes: 0 %
Lymphocytes Absolute: 0.8 10*3/uL (ref 0.7–3.1)
Lymphs: 16 %
MCH: 30.4 pg (ref 26.6–33.0)
MCHC: 33.2 g/dL (ref 31.5–35.7)
MCV: 92 fL (ref 79–97)
Monocytes Absolute: 0.4 10*3/uL (ref 0.1–0.9)
Monocytes: 9 %
Neutrophils Absolute: 3.5 10*3/uL (ref 1.4–7.0)
Neutrophils: 71 %
Platelets: 225 10*3/uL (ref 150–450)
RBC: 4.08 x10E6/uL (ref 3.77–5.28)
RDW: 12.9 % (ref 11.7–15.4)
WBC: 4.9 10*3/uL (ref 3.4–10.8)

## 2023-08-07 LAB — CMP14+EGFR
ALT: 15 IU/L (ref 0–32)
AST: 22 IU/L (ref 0–40)
Albumin: 4.3 g/dL (ref 3.9–4.9)
Alkaline Phosphatase: 107 IU/L (ref 44–121)
BUN/Creatinine Ratio: 24 (ref 12–28)
BUN: 29 mg/dL — ABNORMAL HIGH (ref 8–27)
Bilirubin Total: 0.6 mg/dL (ref 0.0–1.2)
CO2: 20 mmol/L (ref 20–29)
Calcium: 9 mg/dL (ref 8.7–10.3)
Chloride: 105 mmol/L (ref 96–106)
Creatinine, Ser: 1.22 mg/dL — ABNORMAL HIGH (ref 0.57–1.00)
Globulin, Total: 2.2 g/dL (ref 1.5–4.5)
Glucose: 88 mg/dL (ref 70–99)
Potassium: 4.8 mmol/L (ref 3.5–5.2)
Sodium: 140 mmol/L (ref 134–144)
Total Protein: 6.5 g/dL (ref 6.0–8.5)
eGFR: 50 mL/min/{1.73_m2} — ABNORMAL LOW (ref >59)

## 2023-08-07 LAB — URIC ACID: Uric Acid: 3.9 mg/dL (ref 3.0–7.2)

## 2023-08-07 LAB — LIPID PANEL
Chol/HDL Ratio: 2.1 ratio (ref 0.0–4.4)
Cholesterol, Total: 132 mg/dL (ref 100–199)
HDL: 64 mg/dL (ref >39)
LDL Chol Calc (NIH): 56 mg/dL (ref 0–99)
Triglycerides: 54 mg/dL (ref 0–149)
VLDL Cholesterol Cal: 12 mg/dL (ref 5–40)

## 2023-08-07 LAB — HEMOGLOBIN A1C
Est. average glucose Bld gHb Est-mCnc: 105 mg/dL
Hgb A1c MFr Bld: 5.3 % (ref 4.8–5.6)

## 2023-08-07 LAB — VITAMIN D 25 HYDROXY (VIT D DEFICIENCY, FRACTURES): Vit D, 25-Hydroxy: 21.7 ng/mL — ABNORMAL LOW (ref 30.0–100.0)

## 2023-08-07 LAB — TSH: TSH: 2.47 u[IU]/mL (ref 0.450–4.500)

## 2023-08-07 LAB — VITAMIN B12: Vitamin B-12: 306 pg/mL (ref 232–1245)

## 2023-08-08 ENCOUNTER — Encounter: Payer: Self-pay | Admitting: Family

## 2023-09-10 ENCOUNTER — Ambulatory Visit: Admitting: Family

## 2023-09-10 VITALS — BP 120/80 | HR 50 | Ht 63.0 in | Wt 251.4 lb

## 2023-09-10 DIAGNOSIS — Z0184 Encounter for antibody response examination: Secondary | ICD-10-CM

## 2023-09-10 DIAGNOSIS — E66813 Obesity, class 3: Secondary | ICD-10-CM

## 2023-09-10 DIAGNOSIS — R42 Dizziness and giddiness: Secondary | ICD-10-CM | POA: Diagnosis not present

## 2023-09-10 DIAGNOSIS — Z0130 Encounter for examination of blood pressure without abnormal findings: Secondary | ICD-10-CM

## 2023-09-10 DIAGNOSIS — R55 Syncope and collapse: Secondary | ICD-10-CM | POA: Diagnosis not present

## 2023-09-10 DIAGNOSIS — R002 Palpitations: Secondary | ICD-10-CM | POA: Diagnosis not present

## 2023-09-10 DIAGNOSIS — Z6841 Body Mass Index (BMI) 40.0 and over, adult: Secondary | ICD-10-CM

## 2023-09-10 NOTE — Patient Instructions (Signed)
 The medication we were discussing today is call Qsymia.  Their website has a lot of information.    We will get the monitor set up for you through insurance, someone will call you to schedule when it is approved.  Take blood pressure when you have an episode if possible.

## 2023-09-10 NOTE — Progress Notes (Signed)
 Established Patient Office Visit  Subjective:  Patient ID: Heather Conrad, female    DOB: 09/08/1960  Age: 63 y.o. MRN: [id number]  Chief Complaint  Patient presents with   Follow-up    1 month follow up    Patient is here today for her 1 month follow up.  She has been feeling poorly since last appointment.   She does have additional concerns to discuss today.  Has not been able to get the wegovy  or zepbound,  her insurance does not cover it in any circumstance.  She has been having some lightheadedness - seems to get worse after she eats at times, lights get very bright, feels like she's going to pass out.   Started in April roughly.   No meds changed No Common foods or any other identifiable pattern.  About once a week, maybe more depending on the week.  Notices it more when she goes out.   Asks if we can check to see if she is MMR immune, due to the measles outbreaks.   Labs are not due today.  She needs refills.   I have reviewed her active problem list, medication list, allergies, notes from last encounter, lab results for her appointment today.      No other concerns at this time.   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   Atrial fibrillation (HCC)    Breast cancer (HCC)    Breast cancer in female (HCC) 07/16/2017   Right-invasive mammary cancer   Cancer (HCC)    Cough    Dry skin    Dyspnea    Dysrhythmia 01/2017   atrial fibrillation   Fatigue    Heart murmur    per pt report   History of cholecystectomy 08/06/2023   History of kidney stones    History of methicillin resistant Staphylococcus aureus infection 08/06/2023   History of MRSA infection 2013   leg, abdomen, arm   History of radiation therapy 08/06/2023   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    Hyperlipidemia 2018    Hypertension    Kidney stone 01/2017   Leg cramping    Leg pain    Lumbar stenosis    Mixed hyperlipidemia    Morbid obesity with BMI of 40.0-44.9, adult (HCC) 08/06/2023   Nervousness    Obesity 01/2017   Osteoarthritis of left knee 07/29/2022   Osteoarthritis of right knee 08/06/2023   Pain in joint of right knee 03/21/2023   Palpitations    Personal history of radiation therapy    Pyelonephritis    Shortness of breath on exertion    Stress    Swelling of both lower extremities    UTI (urinary tract infection)    Vitamin D  deficiency    Weakness     Past Surgical History:  Procedure Laterality Date   BREAST BIOPSY Right 07/16/2017   Invasive mammary carcinoma   BREAST LUMPECTOMY Right 09/01/2017   F/U radation, clear margins   BREAST LUMPECTOMY WITH RADIOACTIVE SEED AND SENTINEL LYMPH NODE BIOPSY Right 09/01/2017   Procedure: BREAST LUMPECTOMY WITH RADIOACTIVE SEED AND SENTINEL LYMPH NODE BIOPSY;  Surgeon: Ethyl Lenis, MD;  Location: [facility] OR;  Service: General;  Laterality: Right;   CARDIOVERSION Right 07/27/2020   Procedure: CARDIOVERSION;  Surgeon: Fernand Denyse LABOR,  MD;  Location: [facility] ORS;  Service: Cardiovascular;  Laterality: Right;   CHOLECYSTECTOMY     1992   DILATION AND CURETTAGE OF UTERUS     EXTRACORPOREAL SHOCK WAVE LITHOTRIPSY Left 02/13/2017   Procedure: EXTRACORPOREAL SHOCK WAVE LITHOTRIPSY (ESWL);  Surgeon: Kassie Ozell SAUNDERS, MD;  Location: [facility] ORS;  Service: Urology;  Laterality: Left;   FOOT FRACTURE SURGERY Right 2018   repair of trigonem of ankle. endoscopic fasciotomy   INDUCED ABORTION     KNEE SURGERY Right    knee scope for torn meniscus   LUMBAR LAMINECTOMY/DECOMPRESSION MICRODISCECTOMY Bilateral 10/15/2017   Procedure: Laminectomy and Foraminotomy - Lumbar one-two, Lumbar two-three, Lumbar three-four  - bilateral;  Surgeon: Joshua Alm RAMAN, MD;  Location: [facility] OR;  Service: Neurosurgery;  Laterality: Bilateral;    Social History   Socioeconomic  History   Marital status: Single    Spouse name: Not on file   Number of children: 2   Years of education: 12   Highest education level: Not on file  Occupational History   Occupation: Architect  Tobacco Use   Smoking status: Never   Smokeless tobacco: Never  Vaping Use   Vaping status: Never Used  Substance and Sexual Activity   Alcohol use: Yes    Comment: rare   Drug use: No   Sexual activity: Not Currently    Birth control/protection: Post-menopausal  Other Topics Concern   Not on file  Social History Narrative   Not on file   Social Drivers of Health   Financial Resource Strain: Not on file  Food Insecurity: No Food Insecurity (10/14/2022)   Hunger Vital Sign    Worried About Running Out of Food in the Last Year: Never true    Ran Out of Food in the Last Year: Never true  Transportation Needs: No Transportation Needs (10/14/2022)   PRAPARE - Administrator, Civil Service (Medical): No    Lack of Transportation (Non-Medical): No  Physical Activity: Not on file  Stress: Not on file  Social Connections: Not on file  Intimate Partner Violence: Not At Risk (10/14/2022)   Humiliation, Afraid, Rape, and Kick questionnaire    Fear of Current or Ex-Partner: No    Emotionally Abused: No    Physically Abused: No    Sexually Abused: No    Family History  Problem Relation Age of Onset   Heart disease Father        deceased 65; had 15 siblings   Hypertension Father    Kidney disease Father    Colon cancer Paternal Uncle 62       deceased 24s   Hypertension Paternal Uncle    Diabetes Maternal Aunt    Hypertension Maternal Aunt    Heart disease Mother        deceased at 67; had 7 siblings   Hypertension Mother    Hyperlipidemia Mother    Depression Mother    Anxiety disorder Mother    Sleep apnea Mother    Obesity Mother    Uterine cancer Sister 23       currently 79; no children   Breast cancer Cousin 68       currently 54; daughter of  unaffected maternal aunt    No Known Allergies  Review of Systems  All other systems reviewed and are negative.      Objective:   BP 120/80   Pulse (!) 50   Ht 5' 3 (1.6 m)   Wt  251 lb 6.4 oz (114 kg)   LMP  (LMP Unknown)   SpO2 97%   BMI 44.53 kg/m   Vitals:   09/10/23 0910  BP: 120/80  Pulse: (!) 50  Height: 5' 3 (1.6 m)  Weight: 251 lb 6.4 oz (114 kg)  SpO2: 97%  BMI (Calculated): 44.54    Physical Exam Vitals and nursing note reviewed.  Constitutional:      Appearance: Normal appearance. She is normal weight.  HENT:     Head: Normocephalic.  Eyes:     Extraocular Movements: Extraocular movements intact.     Conjunctiva/sclera: Conjunctivae normal.     Pupils: Pupils are equal, round, and reactive to light.  Cardiovascular:     Rate and Rhythm: Normal rate.  Pulmonary:     Effort: Pulmonary effort is normal.  Neurological:     General: No focal deficit present.     Mental Status: She is alert and oriented to person, place, and time. Mental status is at baseline.  Psychiatric:        Mood and Affect: Mood normal.        Behavior: Behavior normal.        Thought Content: Thought content normal.      Results for orders placed or performed in visit on 09/10/23  Measles/Mumps/Rubella Immunity  Result Value Ref Range   Rubella Antibodies, IGG 2.39 Immune >0.99 index   RUBEOLA AB, IGG 268.0 Immune >16.4 AU/mL   MUMPS ABS, IGG 101.0 Immune >10.9 AU/mL    Recent Results (from the past 2160 hours)  Lipid panel     Status: None   Collection Time: 08/06/23 10:24 AM  Result Value Ref Range   Cholesterol, Total 132 100 - 199 mg/dL   Triglycerides 54 0 - 149 mg/dL   HDL 64 >60 mg/dL   VLDL Cholesterol Cal 12 5 - 40 mg/dL   LDL Chol Calc (NIH) 56 0 - 99 mg/dL   Chol/HDL Ratio 2.1 0.0 - 4.4 ratio    Comment:                                   T. Chol/HDL Ratio                                             Men  Women                               1/2  Avg.Risk  3.4    3.3                                   Avg.Risk  5.0    4.4                                2X Avg.Risk  9.6    7.1                                3X Avg.Risk 23.4   11.0   VITAMIN D  25 Hydroxy (Vit-D Deficiency, Fractures)  Status: Abnormal   Collection Time: 08/06/23 10:24 AM  Result Value Ref Range   Vit D, 25-Hydroxy 21.7 (L) 30.0 - 100.0 ng/mL    Comment: Vitamin D  deficiency has been defined by the Institute of Medicine and an Endocrine Society practice guideline as a level of serum 25-OH vitamin D  less than 20 ng/mL (1,2). The Endocrine Society went on to further define vitamin D  insufficiency as a level between 21 and 29 ng/mL (2). 1. IOM (Institute of Medicine). 2010. Dietary reference    intakes for calcium  and D. [place]: The    [employer]. 2. Holick MF, Binkley [place], Bischoff-Ferrari HA, et al.    Evaluation, treatment, and prevention of vitamin D     deficiency: an Endocrine Society clinical practice    guideline. JCEM. 2011 Jul; 96(7):1911-30.   CMP14+EGFR     Status: Abnormal   Collection Time: 08/06/23 10:24 AM  Result Value Ref Range   Glucose 88 70 - 99 mg/dL   BUN 29 (H) 8 - 27 mg/dL   Creatinine, Ser 8.77 (H) 0.57 - 1.00 mg/dL   eGFR 50 (L) >40 fO/fpw/8.26   BUN/Creatinine Ratio 24 12 - 28   Sodium 140 134 - 144 mmol/L   Potassium 4.8 3.5 - 5.2 mmol/L   Chloride 105 96 - 106 mmol/L   CO2 20 20 - 29 mmol/L   Calcium  9.0 8.7 - 10.3 mg/dL   Total Protein 6.5 6.0 - 8.5 g/dL   Albumin 4.3 3.9 - 4.9 g/dL   Globulin, Total 2.2 1.5 - 4.5 g/dL   Bilirubin Total 0.6 0.0 - 1.2 mg/dL   Alkaline Phosphatase 107 44 - 121 IU/L   AST 22 0 - 40 IU/L   ALT 15 0 - 32 IU/L  TSH     Status: None   Collection Time: 08/06/23 10:24 AM  Result Value Ref Range   TSH 2.470 0.450 - 4.500 uIU/mL  Hemoglobin A1c     Status: None   Collection Time: 08/06/23 10:24 AM  Result Value Ref Range   Hgb A1c MFr Bld 5.3 4.8 - 5.6 %    Comment:           Prediabetes: 5.7 - 6.4          Diabetes: >6.4          Glycemic control for adults with diabetes: <7.0    Est. average glucose Bld gHb Est-mCnc 105 mg/dL  Vitamin A87     Status: None   Collection Time: 08/06/23 10:24 AM  Result Value Ref Range   Vitamin B-12 306 232 - 1,245 pg/mL  CBC with Diff     Status: None   Collection Time: 08/06/23 10:24 AM  Result Value Ref Range   WBC 4.9 3.4 - 10.8 x10E3/uL   RBC 4.08 3.77 - 5.28 x10E6/uL   Hemoglobin 12.4 11.1 - 15.9 g/dL   Hematocrit 62.5 65.9 - 46.6 %   MCV 92 79 - 97 fL   MCH 30.4 26.6 - 33.0 pg   MCHC 33.2 31.5 - 35.7 g/dL   RDW 87.0 88.2 - 84.5 %   Platelets 225 150 - 450 x10E3/uL   Neutrophils 71 Not Estab. %   Lymphs 16 Not Estab. %   Monocytes 9 Not Estab. %   Eos 3 Not Estab. %   Basos 1 Not Estab. %   Neutrophils Absolute 3.5 1.4 - 7.0 x10E3/uL   Lymphocytes Absolute 0.8 0.7 - 3.1 x10E3/uL   Monocytes Absolute  0.4 0.1 - 0.9 x10E3/uL   EOS (ABSOLUTE) 0.1 0.0 - 0.4 x10E3/uL   Basophils Absolute 0.0 0.0 - 0.2 x10E3/uL   Immature Granulocytes 0 Not Estab. %   Immature Grans (Abs) 0.0 0.0 - 0.1 x10E3/uL  Uric acid     Status: None   Collection Time: 08/06/23 10:24 AM  Result Value Ref Range   Uric Acid 3.9 3.0 - 7.2 mg/dL    Comment:            Therapeutic target for gout patients: <6.0  Measles/Mumps/Rubella Immunity     Status: None   Collection Time: 09/10/23 10:15 AM  Result Value Ref Range   Rubella Antibodies, IGG 2.39 Immune >0.99 index    Comment:                                 Non-immune       <0.90                                 Equivocal  0.90 - 0.99                                 Immune           >0.99    RUBEOLA AB, IGG 268.0 Immune >16.4 AU/mL    Comment:                                  Negative        <13.5                                  Equivocal 13.5 - 16.4                                  Positive        >16.4 Presence of antibodies to Rubeola is presumptive evidence of immunity except when  acute infection is suspected.    MUMPS ABS, IGG 101.0 Immune >10.9 AU/mL    Comment:                                 Negative         <9.0                                 Equivocal  9.0 - 10.9                                 Positive        >10.9 A positive result generally indicates past exposure to Mumps virus or previous vaccination.        Assessment & Plan:   Assessment & Plan Encounter for antibody response examination Checking MMR status today.  Will call with results when available.  Postural dizziness with presyncope Palpitations Will get Holter monitor for patient.  Given that she has symptoms frequently,we will hopefully be able  to capture with this.   Will call to set up the application appointment once approved.   Class 3 severe obesity due to excess calories with serious comorbidity and body mass index (BMI) of 40.0 to 44.9 in adult Educated pt RE Qsymia.  She will let me know if she decides she wants to try this so I can send RX to the pharmacy for her.      Return to be scheduled based on results of holter.   Total time spent: 20 minutes  ALAN CHRISTELLA ARRANT, FNP  09/10/2023   This document may have been prepared by [facility] Voice Recognition software and as such may include unintentional dictation errors.

## 2023-09-11 LAB — MEASLES/MUMPS/RUBELLA IMMUNITY
MUMPS ABS, IGG: 101 [AU]/ml (ref >10.9)
RUBEOLA AB, IGG: 268 [AU]/ml (ref >16.4)
Rubella Antibodies, IGG: 2.39 {index} (ref >0.99)

## 2023-09-12 ENCOUNTER — Ambulatory Visit: Payer: Self-pay

## 2023-09-18 ENCOUNTER — Ambulatory Visit: Admitting: Cardiovascular Disease

## 2023-09-18 DIAGNOSIS — Z0184 Encounter for antibody response examination: Secondary | ICD-10-CM

## 2023-09-23 NOTE — Progress Notes (Signed)
 14 Day Holter Hook up on office

## 2023-09-24 ENCOUNTER — Other Ambulatory Visit: Payer: Self-pay | Admitting: Family

## 2023-10-02 DIAGNOSIS — R55 Syncope and collapse: Secondary | ICD-10-CM | POA: Diagnosis not present

## 2023-10-20 ENCOUNTER — Other Ambulatory Visit: Payer: Self-pay | Admitting: Cardiovascular Disease

## 2023-10-25 ENCOUNTER — Encounter: Payer: Self-pay | Admitting: Family

## 2023-10-25 NOTE — Assessment & Plan Note (Signed)
 Checking labs today.  Continue current therapy for lipid control. Will modify as needed based on labwork results.   -CMP w/eGFR -Lipid Panel

## 2023-10-25 NOTE — Assessment & Plan Note (Signed)
 Checking labs today.  Will continue supplements as needed.   - Vitamin D  - Vitamin B12 - TSH

## 2023-10-25 NOTE — Assessment & Plan Note (Signed)
 Blood pressure well controlled with current medications.  Continue current therapy.  Will reassess at follow up.   - CBC w/Diff - CMP w/eGFR

## 2023-10-25 NOTE — Assessment & Plan Note (Signed)
 Patient given samples of Wegovy  in office today.  Will see if we are able to get these approved for her.

## 2023-10-26 ENCOUNTER — Other Ambulatory Visit: Payer: Self-pay | Admitting: Internal Medicine

## 2023-10-27 ENCOUNTER — Ambulatory Visit
Admission: EM | Admit: 2023-10-27 | Discharge: 2023-10-27 | Disposition: A | Discharge status: Home / Self Care | Attending: Emergency Medicine | Admitting: Emergency Medicine

## 2023-10-27 ENCOUNTER — Encounter: Payer: Self-pay | Admitting: Emergency Medicine

## 2023-10-27 DIAGNOSIS — S99921A Unspecified injury of right foot, initial encounter: Secondary | ICD-10-CM | POA: Diagnosis not present

## 2023-10-27 MED ORDER — CEPHALEXIN 500 MG PO CAPS: 500.0000 mg | ORAL_CAPSULE | Freq: Two times a day (BID) | ORAL | 0 refills | Status: AC

## 2023-10-27 NOTE — ED Triage Notes (Signed)
 Patient reports right big toe injury that happened last night. Patient reports foot slipped under wooden pallet and caught toenail and almost ripped toe nail off. Bleeding controlled at this time. Rates pain 2/10

## 2023-10-27 NOTE — ED Provider Notes (Signed)
 Heather Conrad    CSN: [id number] Arrival date & time: 10/27/23  0920      History   Chief Complaint Chief Complaint  Patient presents with   Toe Injury    HPI Heather Conrad is a 63 y.o. female.   Patient presents for evaluation of injury to the right great toenail beginning 1 day ago after toe was snagged on the edge of the pallet.  Bleeding has resolved.  Able to complete range of motion and bear weight but pain is elicited with movement.  Wound has attempted to be cleaned and wrapped with bandage.  Denies numbness or tingling.   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   Atrial fibrillation (HCC)    Breast cancer (HCC)    Breast cancer in female (HCC) 07/16/2017   Right-invasive mammary cancer   Cancer (HCC)    Cough    Dry skin    Dyspnea    Dysrhythmia 01/2017   atrial fibrillation   Fatigue    Heart murmur    per pt report   History of cholecystectomy 08/06/2023   History of kidney stones    History of methicillin resistant Staphylococcus aureus infection 08/06/2023   History of MRSA infection 2013   leg, abdomen, arm   History of radiation therapy 08/06/2023   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    Hyperlipidemia 2018   Hypertension    Kidney stone 01/2017   Leg cramping    Leg pain    Lumbar stenosis    Mixed hyperlipidemia    Morbid obesity with BMI of 40.0-44.9, adult (HCC) 08/06/2023   Nervousness    Obesity 01/2017   Osteoarthritis of left knee 07/29/2022   Osteoarthritis of right knee 08/06/2023   Pain in joint of right knee 03/21/2023   Palpitations    Personal history of radiation therapy    Pyelonephritis    Shortness of breath on exertion    Stress    Swelling of both lower extremities    UTI (urinary tract infection)    Vitamin D  deficiency    Weakness      Patient Active Problem List   Diagnosis Date Noted   Varicose veins of lower extremity 08/06/2023   Orthostatic hypotension 08/06/2023   Hypertensive disorder 08/06/2023   Mitral valve regurgitation 08/06/2023   Aortic valve regurgitation 08/06/2023   Anxiety 08/06/2023   Chronic kidney disease, stage 2 (mild) 08/06/2023   Localized edema 03/21/2023   Acute pancreatitis 10/13/2022   Personal history of breast cancer 07/11/2022   Seasonal allergic rhinitis due to pollen 07/11/2022   Other fatigue 11/25/2017   Shortness of breath on exertion 11/25/2017   Essential hypertension, benign 11/25/2017   S/P lumbar laminectomy 10/15/2017   Goals of care, counseling/discussion 08/14/2017   Malignant tumor of breast (HCC) 07/27/2017   Atrial fibrillation (HCC) 07/28/2015   Palpitations 07/28/2015   Lower urinary tract infectious disease 05/10/2011   Sepsis due to Escherichia coli (E. coli) (HCC) 05/10/2011   Nephrolithiasis 05/07/2011   Acute renal failure (HCC) 05/07/2011   Mixed hyperlipidemia 03/28/2006    Past Surgical History:  Procedure Laterality Date   BREAST BIOPSY Right 07/16/2017   Invasive mammary carcinoma   BREAST LUMPECTOMY Right 09/01/2017   F/U radation, clear margins   BREAST  LUMPECTOMY WITH RADIOACTIVE SEED AND SENTINEL LYMPH NODE BIOPSY Right 09/01/2017   Procedure: BREAST LUMPECTOMY WITH RADIOACTIVE SEED AND SENTINEL LYMPH NODE BIOPSY;  Surgeon: Ethyl Lenis, MD;  Location: [facility] OR;  Service: General;  Laterality: Right;   CARDIOVERSION Right 07/27/2020   Procedure: CARDIOVERSION;  Surgeon: Fernand Denyse LABOR, MD;  Location: [facility] ORS;  Service: Cardiovascular;  Laterality: Right;   CHOLECYSTECTOMY     1992   DILATION AND CURETTAGE OF UTERUS     EXTRACORPOREAL SHOCK WAVE LITHOTRIPSY Left 02/13/2017   Procedure: EXTRACORPOREAL SHOCK WAVE LITHOTRIPSY (ESWL);  Surgeon: Kassie Ozell SAUNDERS, MD;  Location: [facility] ORS;  Service: Urology;  Laterality: Left;   FOOT FRACTURE  SURGERY Right 2018   repair of trigonem of ankle. endoscopic fasciotomy   INDUCED ABORTION     KNEE SURGERY Right    knee scope for torn meniscus   LUMBAR LAMINECTOMY/DECOMPRESSION MICRODISCECTOMY Bilateral 10/15/2017   Procedure: Laminectomy and Foraminotomy - Lumbar one-two, Lumbar two-three, Lumbar three-four  - bilateral;  Surgeon: Joshua Lenis RAMAN, MD;  Location: [facility] OR;  Service: Neurosurgery;  Laterality: Bilateral;    OB History     Gravida  3   Para  2   Term  2   Preterm      AB  1   Living  2      SAB      IAB      Ectopic      Multiple      Live Births  2            Home Medications    Prior to Admission medications   Medication Sig Start Date End Date Taking? Authorizing Provider  cephALEXin  (KEFLEX ) 500 MG capsule Take 1 capsule (500 mg total) by mouth 2 (two) times daily for 5 days. 10/27/23 11/01/23 Yes Camani Sesay R, NP  amiodarone  (PACERONE ) 200 MG tablet TAKE 1 TABLET BY MOUTH EVERY DAY 10/20/23   Scoggins, Hospital doctor, NP  amLODipine  (NORVASC ) 5 MG tablet TAKE 1 TABLET(5 MG) BY MOUTH DAILY 04/08/23   Fernand Fredy RAMAN, MD  Biotin 5 MG TABS Take by mouth.    [provider]  letrozole  (FEMARA ) 2.5 MG tablet TAKE 1 TABLET(2.5 MG) BY MOUTH DAILY 04/03/22   Babara Call, MD  lisinopril  (ZESTRIL ) 20 MG tablet TAKE 1 TABLET BY MOUTH EVERY DAY 02/20/23 09/10/23  Fernand Fredy RAMAN, MD  loratadine  (CLARITIN ) 10 MG tablet Take 1 tablet (10 mg total) by mouth daily for 60 doses. Patient not taking: Reported on 08/06/2023 04/29/22 06/28/22  Fernand Fredy RAMAN, MD  Multiple Vitamin (MULTIVITAMIN WITH MINERALS) TABS tablet Take 1 tablet by mouth daily. Centrum Silver  for Women    [provider]  rosuvastatin  (CRESTOR ) 20 MG tablet TAKE 1 TABLET BY MOUTH EVERY DAY 01/06/23   Fernand Fredy RAMAN, MD  Semaglutide -Weight Management (WEGOVY ) 0.25 MG/0.5ML SOAJ Inject 0.25 mg into the skin once a week. 08/06/23   Orlean Alan HERO, FNP  XARELTO  20 MG TABS tablet TAKE 1 TABLET BY  MOUTH DAILY 09/26/23   Orlean Alan HERO, FNP    Family History Family History  Problem Relation Age of Onset   Heart disease Father        deceased 66; had 15 siblings   Hypertension Father    Kidney disease Father    Colon cancer Paternal Uncle 27       deceased 19s   Hypertension Paternal Uncle    Diabetes Maternal Aunt    Hypertension Maternal  Aunt    Heart disease Mother        deceased at 40; had 7 siblings   Hypertension Mother    Hyperlipidemia Mother    Depression Mother    Anxiety disorder Mother    Sleep apnea Mother    Obesity Mother    Uterine cancer Sister 1       currently 71; no children   Breast cancer Cousin 31       currently 44; daughter of unaffected maternal aunt    Social History Social History   Tobacco Use   Smoking status: Never   Smokeless tobacco: Never  Vaping Use   Vaping status: Never Used  Substance Use Topics   Alcohol use: Yes    Comment: rare   Drug use: No     Allergies   Patient has no known allergies.   Review of Systems Review of Systems   Physical Exam Triage Vital Signs ED Triage Vitals  Encounter Vitals Group     BP 10/27/23 0936 113/69     Girls Systolic BP Percentile --      Girls Diastolic BP Percentile --      Boys Systolic BP Percentile --      Boys Diastolic BP Percentile --      Pulse Rate 10/27/23 0936 60     Resp 10/27/23 0926 20     Temp 10/27/23 0926 98.2 F (36.8 C)     Temp Source 10/27/23 0926 Oral     SpO2 10/27/23 0926 98 %     Weight --      Height --      Head Circumference --      Peak Flow --      Pain Score 10/27/23 0930 2     Pain Loc --      Pain Education --      Exclude from Growth Chart --    No data found.  Updated Vital Signs BP 113/69 (BP Location: Left Arm)   Pulse 60   Temp 98.2 F (36.8 C) (Oral)   Resp 20   LMP  (LMP Unknown)   SpO2 98%   Visual Acuity Right Eye Distance:   Left Eye Distance:   Bilateral Distance:    Right Eye Near:   Left Eye Near:     Bilateral Near:     Physical Exam Constitutional:      Appearance: Normal appearance.  Eyes:     Extraocular Movements: Extraocular movements intact.  Feet:     Comments: Dried bloody drainage at the distal aspect of the right great toenail, avulsion of approximately 25 to 50% of the toenail bed,proximal base is fully attached sensation intact, capillary refill less than 3, able to bear weight and complete range of motion Neurological:     Mental Status: She is alert and oriented to person, place, and time. Mental status is at baseline.      UC Treatments / Results  Labs (all labs ordered are listed, but only abnormal results are displayed) Labs Reviewed - No data to display  EKG   Radiology No results found.  Procedures Procedures (including critical care time)  Medications Ordered in UC Medications - No data to display  Initial Impression / Assessment and Plan / UC Course  I have reviewed the triage vital signs and the nursing notes.  Pertinent labs & imaging results that were available during my care of the patient were reviewed by me and considered in my  medical decision making (see chart for details).  Right toe injury, initial encounter  Proximal aspect of the nailbed approximated therefore will not attempt removal, discussed with patient, wound cleansed with chlorhexidine  and sterile water, Coban applied to anchor the nail down, recommended daily cleansing at home and continue ingrowing of the toenail, cephalexin  prescribed empirically, advised to monitor and to follow-up with podiatry, walking referral given if any concerns regarding healing Final Clinical Impressions(s) / UC Diagnoses   Final diagnoses:  Toe injury, right, initial encounter     Discharge Instructions      You were evaluated for the injury to your toe, the base of the toenail is still fully attached and therefore will not attempt removal as this will naturally grow out with time  Wound  has been cleansed here in the office, cleansed with unscented soap and water daily, pat and do not rub  Anchored the toe down using a nonstick bandage or wrap, this ideally will keep further injury from occurring in the toenail from being snagged  Take cephalexin  twice daily for 5 days for treatment of bacteria and to keep area from becoming infected  May take Tylenol  as needed for pain  If you have any concerns regarding healing please follow-up with the podiatrist or your primary doctor for further evaluation   ED Prescriptions     Medication Sig Dispense Auth. Provider   cephALEXin  (KEFLEX ) 500 MG capsule Take 1 capsule (500 mg total) by mouth 2 (two) times daily for 5 days. 10 capsule Cai Flott R, NP      PDMP not reviewed this encounter.   Teresa Shelba SAUNDERS, [place] 10/27/23 [phone]

## 2023-10-27 NOTE — Discharge Instructions (Signed)
 You were evaluated for the injury to your toe, the base of the toenail is still fully attached and therefore will not attempt removal as this will naturally grow out with time  Wound has been cleansed here in the office, cleansed with unscented soap and water daily, pat and do not rub  Anchored the toe down using a nonstick bandage or wrap, this ideally will keep further injury from occurring in the toenail from being snagged  Take cephalexin  twice daily for 5 days for treatment of bacteria and to keep area from becoming infected  May take Tylenol  as needed for pain  If you have any concerns regarding healing please follow-up with the podiatrist or your primary doctor for further evaluation

## 2023-10-29 ENCOUNTER — Encounter: Payer: Self-pay | Admitting: Family

## 2023-10-29 DIAGNOSIS — E66813 Obesity, class 3: Secondary | ICD-10-CM | POA: Insufficient documentation

## 2023-10-29 NOTE — Assessment & Plan Note (Signed)
 Will get Holter monitor for patient.  Given that she has symptoms frequently,we will hopefully be able to capture with this.   Will call to set up the application appointment once approved.

## 2023-10-29 NOTE — Assessment & Plan Note (Signed)
 Educated pt RE Qsymia.  She will let me know if she decides she wants to try this so I can send RX to the pharmacy for her.

## 2023-11-21 ENCOUNTER — Other Ambulatory Visit: Payer: Self-pay | Admitting: Cardiology

## 2024-01-16 ENCOUNTER — Other Ambulatory Visit: Payer: Self-pay | Admitting: Internal Medicine

## 2024-01-16 ENCOUNTER — Other Ambulatory Visit: Payer: Self-pay | Admitting: Family

## 2024-02-20 ENCOUNTER — Other Ambulatory Visit: Payer: Self-pay | Admitting: Cardiovascular Disease

## 2024-02-25 ENCOUNTER — Encounter: Payer: Self-pay | Admitting: Family

## 2024-02-25 ENCOUNTER — Other Ambulatory Visit: Payer: Self-pay | Admitting: Cardiovascular Disease

## 2024-02-25 ENCOUNTER — Encounter: Payer: Self-pay | Admitting: Cardiovascular Disease

## 2024-02-26 ENCOUNTER — Encounter: Payer: Self-pay | Admitting: Cardiovascular Disease

## 2024-02-26 ENCOUNTER — Other Ambulatory Visit: Payer: Self-pay

## 2024-02-26 ENCOUNTER — Other Ambulatory Visit: Payer: Self-pay | Admitting: Cardiovascular Disease

## 2024-02-26 ENCOUNTER — Encounter: Payer: Self-pay | Admitting: Family

## 2024-03-04 ENCOUNTER — Ambulatory Visit: Admitting: Cardiovascular Disease

## 2024-03-08 ENCOUNTER — Ambulatory Visit: Admitting: Cardiology

## 2024-03-12 ENCOUNTER — Ambulatory Visit: Attending: Cardiology | Admitting: Cardiology

## 2024-03-12 ENCOUNTER — Encounter: Payer: Self-pay | Admitting: Cardiology

## 2024-03-12 VITALS — BP 118/70 | HR 37 | Ht 62.0 in | Wt 260.2 lb

## 2024-03-12 DIAGNOSIS — I10 Essential (primary) hypertension: Secondary | ICD-10-CM

## 2024-03-12 DIAGNOSIS — I4891 Unspecified atrial fibrillation: Secondary | ICD-10-CM

## 2024-03-12 DIAGNOSIS — I351 Nonrheumatic aortic (valve) insufficiency: Secondary | ICD-10-CM | POA: Diagnosis not present

## 2024-03-12 NOTE — Progress Notes (Signed)
 " Cardiology Office Note:    Date:  03/12/2024   ID:  Heather Conrad, DOB 02/18/1961, MRN [id number]  PCP:  Orlean Alan HERO, FNP   [facility] Providers Cardiologist:  None     Referring MD: Orlean Alan HERO, FNP   Chief Complaint  Patient presents with   New Patient (Initial Visit)    Self referral to establish care for Nonrheumatic aortic (valve) insufficiency - Nonrheumatic mitral valve regurgitation - Mixed hyperlipidemia Essential hypertension, benign - Atrial fibrillation, unspecified type Patient denies any cardiac concerns today.     History of Present Illness:    Heather Conrad is a 63 y.o. female with a hx of persistent atrial fibrillation, hypertension presenting to establish care.  Previously seen by [facility].  Diagnosed with atrial fibrillation several years.  Takes amiodarone  and Xarelto  for stroke prophylaxis.  Denies dizziness, presyncope or syncope.  States having low heart rates usually in the 30s to 40s.  Outside echocardiogram 02/2023 showed normal LVEF, trace MR, mild MR, mild AI.  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   Atrial fibrillation (HCC)    Breast cancer (HCC)    Breast cancer in female (HCC) 07/16/2017   Right-invasive mammary cancer   Cancer (HCC)    Cough    Dry skin    Dyspnea    Dysrhythmia 01/2017   atrial fibrillation   Fatigue    Heart murmur    per pt report   History of cholecystectomy 08/06/2023   History of kidney stones    History of methicillin resistant Staphylococcus aureus infection 08/06/2023   History of MRSA infection 2013   leg, abdomen, arm   History of radiation therapy 08/06/2023   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    Hyperlipidemia 2018   Hypertension    Kidney stone 01/2017   Leg cramping    Leg  pain    Lumbar stenosis    Mixed hyperlipidemia    Morbid obesity with BMI of 40.0-44.9, adult (HCC) 08/06/2023   Nervousness    Obesity 01/2017   Osteoarthritis of left knee 07/29/2022   Osteoarthritis of right knee 08/06/2023   Pain in joint of right knee 03/21/2023   Palpitations    Personal history of radiation therapy    Pyelonephritis    Shortness of breath on exertion    Stress    Swelling of both lower extremities    UTI (urinary tract infection)    Vitamin D  deficiency    Weakness     Past Surgical History:  Procedure Laterality Date   BREAST BIOPSY Right 07/16/2017   Invasive mammary carcinoma   BREAST LUMPECTOMY Right 09/01/2017   F/U radation, clear margins   BREAST LUMPECTOMY WITH RADIOACTIVE SEED AND SENTINEL LYMPH NODE BIOPSY Right 09/01/2017   Procedure: BREAST LUMPECTOMY WITH RADIOACTIVE SEED AND SENTINEL LYMPH NODE BIOPSY;  Surgeon: Ethyl Lenis, MD;  Location[facility] OR;  Service: General;  Laterality: Right;   CARDIOVERSION Right 07/27/2020   Procedure: CARDIOVERSION;  Surgeon: Fernand Denyse LABOR, MD;  Location: [facility] ORS;  Service: Cardiovascular;  Laterality: Right;   CHOLECYSTECTOMY     1992   DILATION AND CURETTAGE OF UTERUS     EXTRACORPOREAL SHOCK WAVE LITHOTRIPSY Left 02/13/2017   Procedure: EXTRACORPOREAL SHOCK WAVE LITHOTRIPSY (ESWL);  Surgeon: Kassie Ozell SAUNDERS, MD;  Location: [facility] ORS;  Service: Urology;  Laterality: Left;   FOOT FRACTURE SURGERY Right 2018   repair of trigonem of ankle. endoscopic fasciotomy   INDUCED ABORTION     KNEE SURGERY Right    knee scope for torn meniscus   LUMBAR LAMINECTOMY/DECOMPRESSION MICRODISCECTOMY Bilateral 10/15/2017   Procedure: Laminectomy and Foraminotomy - Lumbar one-two, Lumbar two-three, Lumbar three-four  - bilateral;  Surgeon: Joshua Alm RAMAN, MD;  Location: [facility] OR;  Service: Neurosurgery;  Laterality: Bilateral;    Current Medications: Active Medications[1]   Allergies:   Patient has no known allergies.    Social History   Socioeconomic History   Marital status: Single    Spouse name: Not on file   Number of children: 2   Years of education: 12   Highest education level: Not on file  Occupational History   Occupation: architect  Tobacco Use   Smoking status: Never   Smokeless tobacco: Never  Vaping Use   Vaping status: Never Used  Substance and Sexual Activity   Alcohol use: Yes    Comment: rare   Drug use: No   Sexual activity: Not Currently    Birth control/protection: Post-menopausal  Other Topics Concern   Not on file  Social History Narrative   Not on file   Social Drivers of Health   Tobacco Use: Low Risk (03/12/2024)   Patient History    Smoking Tobacco Use: Never    Smokeless Tobacco Use: Never    Passive Exposure: Not on file  Financial Resource Strain: Not on file  Food Insecurity: No Food Insecurity (10/14/2022)   Hunger Vital Sign    Worried About Running Out of Food in the Last Year: Never true    Ran Out of Food in the Last Year: Never true  Transportation Needs: No Transportation Needs (10/14/2022)   PRAPARE - Administrator, Civil Service (Medical): No    Lack of Transportation (Non-Medical): No  Physical Activity: Not on file  Stress: Not on file  Social Connections: Not on file  Depression (EYV7-0): Not on file  Alcohol Screen: Not on file  Housing: Low Risk (10/14/2022)   Housing    Last Housing Risk Score: 0  Utilities: Not At Risk (10/14/2022)   AHC Utilities    Threatened with loss of utilities: No  Health Literacy: Not on file     Family History: The patient's family history includes Anxiety disorder in her mother; Breast cancer (age of onset: 41) in her cousin; Colon cancer (age of onset: 62) in her paternal uncle; Depression in her mother; Diabetes in her maternal aunt; Heart disease in her father and mother; Hyperlipidemia in her mother; Hypertension in her father, maternal aunt, mother, and paternal uncle; Kidney  disease in her father; Obesity in her mother; Sleep apnea in her mother; Uterine cancer (age of onset: 50) in her sister.  ROS:   Please see the history of present illness.     All other systems reviewed and are negative.  EKGs/Labs/Other Studies Reviewed:    The following studies were reviewed today:  EKG Interpretation Date/Time:  Friday March 12 2024 10:36:02 EST Ventricular Rate:  37 PR Interval:  112 QRS Duration:  88 QT Interval:  528 QTC Calculation: 414 R Axis:   -44  Text Interpretation: Marked sinus bradycardia Left axis deviation ST & T wave abnormality, consider anterolateral ischemia Confirmed by Darliss Rogue ([id number]) on 03/12/2024 10:38:37 AM  Recent Labs: 08/06/2023: ALT 15; BUN 29; Creatinine, Ser 1.22; Hemoglobin 12.4; Platelets 225; Potassium 4.8; Sodium 140; TSH 2.470  Recent Lipid Panel    Component Value Date/Time   CHOL 132 08/06/2023 1024   TRIG 54 08/06/2023 1024   HDL 64 08/06/2023 1024   CHOLHDL 2.1 08/06/2023 1024   CHOLHDL 1.6 10/13/2022 2102   VLDL 7 10/13/2022 2102   LDLCALC 56 08/06/2023 1024     Risk Assessment/Calculations:             Physical Exam:    VS:  BP 118/70 (BP Location: Right Arm, Patient Position: Sitting, Cuff Size: Large)   Pulse (!) 37   Ht 5' 2 (1.575 m)   Wt 260 lb 4 oz (118 kg)   LMP  (LMP Unknown)   SpO2 96%   BMI 47.60 kg/m     Wt Readings from Last 3 Encounters:  03/12/24 260 lb 4 oz (118 kg)  09/10/23 251 lb 6.4 oz (114 kg)  08/06/23 255 lb 3.2 oz (115.8 kg)     GEN:  Well nourished, well developed in no acute distress HEENT: Normal NECK: No JVD; No carotid bruits CARDIAC: Bradycardic, regular. RESPIRATORY:  Clear to auscultation without rales, wheezing or rhonchi  ABDOMEN: Soft, non-tender, non-distended MUSCULOSKELETAL:  No edema; No deformity  SKIN: Warm and dry NEUROLOGIC:  Alert and oriented x 3 PSYCHIATRIC:  Normal affect   ASSESSMENT:    1. Atrial fibrillation, unspecified  type (HCC)   2. Essential hypertension, benign   3. Nonrheumatic aortic valve insufficiency   4. Morbid obesity (HCC)    PLAN:    In order of problems listed above:  Atrial fibrillation, currently in sinus bradycardia, heart rate 37.  Stop amiodarone , continue Xarelto  20 mg daily.  Monitor heart rates at home. Hypertension, BP controlled.  Continue Norvasc  5 mg daily, lisinopril  20 mg daily. History of MR, AI.  Repeat echocardiogram to evaluate worsening valvular disease. Morbid obesity, insurance did not approve GLP-1.  Continue low calorie diet.  Follow-up in 3 months     Medication Adjustments/Labs and Tests Ordered: Current medicines are reviewed at length with the patient today.  Concerns regarding medicines are outlined above.  Orders Placed This Encounter  Procedures   EKG 12-Lead   ECHOCARDIOGRAM COMPLETE   No orders of the defined types were placed in this encounter.   Patient Instructions  Medication Instructions:  STOP the Amiodarone  *If you need a refill on your cardiac medications before your next appointment, please call your pharmacy*  Lab Work: None ordered If you have labs (blood work) drawn today and your tests are completely normal, you will receive your results only by: MyChart Message (if you have MyChart) OR A paper copy in the mail If you have any lab test that is abnormal or we need to change your treatment, we will call you to review the results.  Testing/Procedures: Your physician has requested that you have an echocardiogram. Echocardiography is a painless test that uses sound waves to create images of your heart. It provides your doctor with information about the size and shape of your heart and how well your hearts chambers and valves are working.   You may receive an ultrasound enhancing agent through an IV if needed to better visualize your heart during the echo. This procedure takes approximately one hour.  There are no restrictions for  this procedure.  This will take place at [address] [address], [place] [address]  Please note: We ask at that you not bring children with you during ultrasound (echo/ vascular) testing. Due to room size and safety concerns, children are not allowed in the ultrasound rooms during exams. Our front office staff cannot provide observation of children in our lobby area while testing is being conducted. An adult accompanying a patient to their appointment will only be allowed in the ultrasound room at the discretion of the ultrasound technician under special circumstances. We apologize for any inconvenience.   Follow-Up: At [facility], you and your health needs are our priority.  As part of our continuing mission to provide you with exceptional heart care, our providers are all part of one team.  This team includes your primary Cardiologist (physician) and Advanced Practice Providers or APPs (Physician Assistants and Nurse Practitioners) who all work together to provide you with the care you need, when you need it.  Your next appointment:   3 month(s)  Provider:   You may see Dr. Darliss or one of the following Advanced Practice Providers on your designated Care Team:   Lonni Meager, NP Lesley Maffucci, PA-C Bernardino Bring, PA-C Cadence [place], PA-C Tylene Lunch, NP Barnie Hila, NP    We recommend signing up for the patient portal called MyChart.  Sign up information is provided on this After Visit Summary.  MyChart is used to connect with patients for Virtual Visits (Telemedicine).  Patients are able to view lab/test results, encounter notes, upcoming appointments, etc.  Non-urgent messages can be sent to your provider as well.   To learn more about what you can do with MyChart, go to [email or web address].         Signed, Redell Darliss, MD  03/12/2024 12:34 PM    [facility]     [1]  Current Meds  Medication Sig    amLODipine  (NORVASC ) 5 MG tablet TAKE 1 TABLET(5 MG) BY MOUTH DAILY   Biotin 5 MG TABS Take by mouth.   lisinopril  (ZESTRIL ) 20 MG tablet TAKE 1 TABLET BY MOUTH EVERY DAY   Multiple Vitamin (MULTIVITAMIN WITH MINERALS) TABS tablet Take 1 tablet by mouth daily. Centrum Silver  for Women   rosuvastatin  (CRESTOR ) 20 MG tablet TAKE 1 TABLET BY MOUTH EVERY DAY   XARELTO  20 MG TABS tablet TAKE 1 TABLET BY MOUTH DAILY   [DISCONTINUED] amiodarone  (PACERONE ) 200 MG tablet TAKE 1 TABLET BY MOUTH EVERY DAY   "

## 2024-03-12 NOTE — Patient Instructions (Signed)
 Medication Instructions:  STOP the Amiodarone  *If you need a refill on your cardiac medications before your next appointment, please call your pharmacy*  Lab Work: None ordered If you have labs (blood work) drawn today and your tests are completely normal, you will receive your results only by: MyChart Message (if you have MyChart) OR A paper copy in the mail If you have any lab test that is abnormal or we need to change your treatment, we will call you to review the results.  Testing/Procedures: Your physician has requested that you have an echocardiogram. Echocardiography is a painless test that uses sound waves to create images of your heart. It provides your doctor with information about the size and shape of your heart and how well your hearts chambers and valves are working.   You may receive an ultrasound enhancing agent through an IV if needed to better visualize your heart during the echo. This procedure takes approximately one hour.  There are no restrictions for this procedure.  This will take place at [address] [facility] [address], [place] [address]  Please note: We ask at that you not bring children with you during ultrasound (echo/ vascular) testing. Due to room size and safety concerns, children are not allowed in the ultrasound rooms during exams. Our front office staff cannot provide observation of children in our lobby area while testing is being conducted. An adult accompanying a patient to their appointment will only be allowed in the ultrasound room at the discretion of the ultrasound technician under special circumstances. We apologize for any inconvenience.   Follow-Up: At [facility], you and your health needs are our priority.  As part of our continuing mission to provide you with exceptional heart care, our providers are all part of one team.  This team includes your primary Cardiologist (physician) and Advanced Practice Providers or APPs  (Physician Assistants and Nurse Practitioners) who all work together to provide you with the care you need, when you need it.  Your next appointment:   3 month(s)  Provider:   You may see Dr. Darliss or one of the following Advanced Practice Providers on your designated Care Team:   Lonni Meager, NP Lesley Maffucci, PA-C Bernardino Bring, PA-C Cadence Berlin, PA-C Tylene Lunch, NP Barnie Hila, NP    We recommend signing up for the patient portal called MyChart.  Sign up information is provided on this After Visit Summary.  MyChart is used to connect with patients for Virtual Visits (Telemedicine).  Patients are able to view lab/test results, encounter notes, upcoming appointments, etc.  Non-urgent messages can be sent to your provider as well.   To learn more about what you can do with MyChart, go to [email or web address].

## 2024-04-08 ENCOUNTER — Ambulatory Visit: Attending: Cardiology

## 2024-04-08 DIAGNOSIS — I083 Combined rheumatic disorders of mitral, aortic and tricuspid valves: Secondary | ICD-10-CM

## 2024-04-08 DIAGNOSIS — I4891 Unspecified atrial fibrillation: Secondary | ICD-10-CM

## 2024-04-08 LAB — ECHOCARDIOGRAM COMPLETE
AR max vel: 2.76 cm2
AV Area VTI: 2.63 cm2
AV Area mean vel: 2.61 cm2
AV Mean grad: 6 mmHg
AV Peak grad: 11.3 mmHg
Ao pk vel: 1.68 m/s
Area-P 1/2: 3.48 cm2
S' Lateral: 3.2 cm

## 2024-04-13 ENCOUNTER — Ambulatory Visit: Payer: Self-pay | Admitting: Cardiology

## 2024-04-14 MED ORDER — RIVAROXABAN 20 MG PO TABS: 20.0000 mg | ORAL_TABLET | Freq: Every day | ORAL | 0 refills | Status: AC

## 2024-04-19 ENCOUNTER — Telehealth: Payer: Self-pay | Admitting: Cardiology

## 2024-04-19 ENCOUNTER — Other Ambulatory Visit
Admission: RE | Admit: 2024-04-19 | Discharge: 2024-04-19 | Disposition: A | Source: Ambulatory Visit | Attending: Cardiology | Admitting: Cardiology

## 2024-04-19 ENCOUNTER — Ambulatory Visit: Admitting: Cardiology

## 2024-04-19 ENCOUNTER — Encounter: Payer: Self-pay | Admitting: Cardiology

## 2024-04-19 VITALS — BP 112/61 | HR 87 | Ht 63.0 in | Wt 268.0 lb

## 2024-04-19 DIAGNOSIS — I480 Paroxysmal atrial fibrillation: Secondary | ICD-10-CM | POA: Diagnosis not present

## 2024-04-19 DIAGNOSIS — Z79899 Other long term (current) drug therapy: Secondary | ICD-10-CM | POA: Diagnosis not present

## 2024-04-19 DIAGNOSIS — I340 Nonrheumatic mitral (valve) insufficiency: Secondary | ICD-10-CM | POA: Diagnosis not present

## 2024-04-19 DIAGNOSIS — I351 Nonrheumatic aortic (valve) insufficiency: Secondary | ICD-10-CM

## 2024-04-19 DIAGNOSIS — E782 Mixed hyperlipidemia: Secondary | ICD-10-CM | POA: Diagnosis not present

## 2024-04-19 DIAGNOSIS — I10 Essential (primary) hypertension: Secondary | ICD-10-CM | POA: Diagnosis not present

## 2024-04-19 LAB — BASIC METABOLIC PANEL WITH GFR
Anion gap: 11 (ref 5–15)
BUN: 23 mg/dL (ref 8–23)
CO2: 25 mmol/L (ref 22–32)
Calcium: 9.5 mg/dL (ref 8.9–10.3)
Chloride: 104 mmol/L (ref 98–111)
Creatinine, Ser: 1.15 mg/dL — ABNORMAL HIGH (ref 0.44–1.00)
GFR, Estimated: 54 mL/min — ABNORMAL LOW
Glucose, Bld: 105 mg/dL — ABNORMAL HIGH (ref 70–99)
Potassium: 4.9 mmol/L (ref 3.5–5.1)
Sodium: 141 mmol/L (ref 135–145)

## 2024-04-19 LAB — CBC
HCT: 42.5 % (ref 36.0–46.0)
Hemoglobin: 14.3 g/dL (ref 12.0–15.0)
MCH: 30.4 pg (ref 26.0–34.0)
MCHC: 33.6 g/dL (ref 30.0–36.0)
MCV: 90.2 fL (ref 80.0–100.0)
Platelets: 236 10*3/uL (ref 150–400)
RBC: 4.71 MIL/uL (ref 3.87–5.11)
RDW: 13.4 % (ref 11.5–15.5)
WBC: 6.8 10*3/uL (ref 4.0–10.5)
nRBC: 0 % (ref 0.0–0.2)

## 2024-04-19 MED ORDER — LISINOPRIL 20 MG PO TABS: 20.0000 mg | ORAL_TABLET | Freq: Every day | ORAL | 3 refills | Status: AC

## 2024-04-19 MED ORDER — METOPROLOL SUCCINATE ER 25 MG PO TB24: 12.5000 mg | ORAL_TABLET | Freq: Every day | ORAL | 3 refills | Status: AC

## 2024-04-19 NOTE — Telephone Encounter (Signed)
 Called and spoke with the patient.  Inquired about her symptoms.  Patient denies any chest pain.  Does endorse some lightheadedness with accompanying fatigue and generally not feeling right.  Patient does not have any blood pressure or heart rate numbers to report other than to state that according to her smart watch, her HR is in the range of low 50 to high of 108.  Patient scheduled for an office visit with Tylene Lunch, NP today at 3:10 pm.  Patient agreeable with scheduling the office visit and expressed gratitude for the call.

## 2024-04-20 ENCOUNTER — Ambulatory Visit: Payer: Self-pay | Admitting: Cardiology

## 2024-04-22 ENCOUNTER — Other Ambulatory Visit: Payer: Self-pay | Admitting: Internal Medicine

## 2024-04-22 DIAGNOSIS — I10 Essential (primary) hypertension: Secondary | ICD-10-CM

## 2024-04-23 ENCOUNTER — Encounter: Payer: Self-pay | Admitting: Anesthesiology

## 2024-04-23 ENCOUNTER — Encounter: Admission: RE | Payer: Self-pay

## 2024-04-23 ENCOUNTER — Ambulatory Visit: Admission: RE | Admit: 2024-04-23 | Admitting: Cardiology

## 2024-05-12 ENCOUNTER — Ambulatory Visit: Admitting: Cardiology

## 2024-06-10 ENCOUNTER — Ambulatory Visit: Admitting: Physician Assistant
